# Patient Record
Sex: Male | Born: 1955 | Race: White | Hispanic: No | Marital: Married | Smoking: Never smoker
Health system: Southern US, Community
[De-identification: ages and names within clinical notes are randomized; demographics above are authoritative.]

## PROBLEM LIST (undated history)

## (undated) DIAGNOSIS — C8590 Non-Hodgkin lymphoma, unspecified, unspecified site: Secondary | ICD-10-CM

## (undated) DIAGNOSIS — K279 Peptic ulcer, site unspecified, unspecified as acute or chronic, without hemorrhage or perforation: Secondary | ICD-10-CM

## (undated) DIAGNOSIS — E785 Hyperlipidemia, unspecified: Secondary | ICD-10-CM

## (undated) DIAGNOSIS — K219 Gastro-esophageal reflux disease without esophagitis: Secondary | ICD-10-CM

## (undated) DIAGNOSIS — E119 Type 2 diabetes mellitus without complications: Secondary | ICD-10-CM

## (undated) DIAGNOSIS — K449 Diaphragmatic hernia without obstruction or gangrene: Secondary | ICD-10-CM

## (undated) DIAGNOSIS — I10 Essential (primary) hypertension: Secondary | ICD-10-CM

## (undated) HISTORY — DX: Essential (primary) hypertension: I10

## (undated) HISTORY — PX: COLONOSCOPY: SHX174

## (undated) HISTORY — DX: Hyperlipidemia, unspecified: E78.5

## (undated) HISTORY — PX: PILONIDAL CYST / SINUS EXCISION: SUR543

## (undated) HISTORY — DX: Gastro-esophageal reflux disease without esophagitis: K21.9

## (undated) HISTORY — PX: TONSILLECTOMY: SUR1361

## (undated) HISTORY — DX: Peptic ulcer, site unspecified, unspecified as acute or chronic, without hemorrhage or perforation: K27.9

## (undated) HISTORY — DX: Non-Hodgkin lymphoma, unspecified, unspecified site: C85.90

---

## 1978-02-07 HISTORY — PX: FINGER AMPUTATION: SHX636

## 2003-02-08 LAB — FECAL OCCULT BLOOD, GUAIAC: Fecal Occult Blood: NEGATIVE

## 2004-04-19 ENCOUNTER — Ambulatory Visit: Payer: Self-pay | Admitting: Family Medicine

## 2005-12-16 ENCOUNTER — Ambulatory Visit: Payer: Self-pay | Admitting: Family Medicine

## 2006-07-14 ENCOUNTER — Ambulatory Visit: Payer: Self-pay | Admitting: Internal Medicine

## 2006-07-14 DIAGNOSIS — K219 Gastro-esophageal reflux disease without esophagitis: Secondary | ICD-10-CM

## 2006-07-14 DIAGNOSIS — E781 Pure hyperglyceridemia: Secondary | ICD-10-CM

## 2006-07-14 DIAGNOSIS — M109 Gout, unspecified: Secondary | ICD-10-CM

## 2006-07-14 DIAGNOSIS — K279 Peptic ulcer, site unspecified, unspecified as acute or chronic, without hemorrhage or perforation: Secondary | ICD-10-CM | POA: Insufficient documentation

## 2006-07-14 LAB — CONVERTED CEMR LAB
Albumin: 4.7 g/dL (ref 3.5–5.2)
BUN: 9 mg/dL (ref 6–23)
Bilirubin Urine: NEGATIVE
CO2: 22 meq/L (ref 19–32)
Calcium: 9.4 mg/dL (ref 8.4–10.5)
Chloride: 102 meq/L (ref 96–112)
Cholesterol: 194 mg/dL (ref 0–200)
Creatinine, Ser: 1.13 mg/dL (ref 0.40–1.50)
Creatinine, Urine: 323.3 mg/dL
Glucose, Bld: 88 mg/dL (ref 70–99)
HDL: 50 mg/dL (ref >39)
Hemoglobin, Urine: NEGATIVE
Hemoglobin: 16.5 g/dL (ref 13.0–17.0)
Lymphocytes Relative: 38 % (ref 12–46)
Lymphs Abs: 3.4 10*3/uL — ABNORMAL HIGH (ref 0.7–3.3)
MCHC: 32.9 g/dL (ref 30.0–36.0)
Microalb Creat Ratio: 10.4 mg/g (ref 0.0–30.0)
Monocytes Absolute: 0.8 10*3/uL — ABNORMAL HIGH (ref 0.2–0.7)
Monocytes Relative: 9 % (ref 3–11)
Neutro Abs: 4.3 10*3/uL (ref 1.7–7.7)
Potassium: 4.2 meq/L (ref 3.5–5.3)
Protein, ur: NEGATIVE mg/dL
RBC: 4.9 M/uL (ref 4.22–5.81)
Total CHOL/HDL Ratio: 3.9
Urobilinogen, UA: 0.2 (ref 0.0–1.0)
WBC, UA: NONE SEEN cells/hpf (ref <3)
WBC: 9 10*3/uL (ref 4.0–10.5)

## 2006-07-17 ENCOUNTER — Telehealth (INDEPENDENT_AMBULATORY_CARE_PROVIDER_SITE_OTHER): Payer: Self-pay | Admitting: *Deleted

## 2006-07-19 ENCOUNTER — Ambulatory Visit: Payer: Self-pay | Admitting: Internal Medicine

## 2006-07-19 DIAGNOSIS — E785 Hyperlipidemia, unspecified: Secondary | ICD-10-CM

## 2006-07-19 DIAGNOSIS — I10 Essential (primary) hypertension: Secondary | ICD-10-CM | POA: Insufficient documentation

## 2006-08-16 ENCOUNTER — Ambulatory Visit: Payer: Self-pay | Admitting: Internal Medicine

## 2006-09-22 ENCOUNTER — Telehealth (INDEPENDENT_AMBULATORY_CARE_PROVIDER_SITE_OTHER): Payer: Self-pay | Admitting: *Deleted

## 2006-10-19 ENCOUNTER — Ambulatory Visit: Payer: Self-pay | Admitting: Internal Medicine

## 2006-10-20 DIAGNOSIS — E1149 Type 2 diabetes mellitus with other diabetic neurological complication: Secondary | ICD-10-CM | POA: Insufficient documentation

## 2006-10-20 LAB — CONVERTED CEMR LAB
ALT: 40 units/L (ref 0–53)
AST: 40 units/L — ABNORMAL HIGH (ref 0–37)
Albumin: 3.6 g/dL (ref 3.5–5.2)
Bilirubin, Direct: 0.1 mg/dL (ref 0.0–0.3)
Calcium: 9.2 mg/dL (ref 8.4–10.5)
Chloride: 101 meq/L (ref 96–112)
GFR calc Af Amer: 91 mL/min
GFR calc non Af Amer: 75 mL/min
PSA: 0.77 ng/mL (ref 0.10–4.00)
Sodium: 136 meq/L (ref 135–145)
Total CHOL/HDL Ratio: 3.8
Triglycerides: 234 mg/dL (ref 0–149)
VLDL: 47 mg/dL — ABNORMAL HIGH (ref 0–40)

## 2006-10-30 ENCOUNTER — Ambulatory Visit: Payer: Self-pay | Admitting: Family Medicine

## 2006-10-31 LAB — CONVERTED CEMR LAB: Hgb A1c MFr Bld: 6.3 % — ABNORMAL HIGH (ref 4.6–6.0)

## 2006-12-29 ENCOUNTER — Ambulatory Visit: Payer: Self-pay | Admitting: Family Medicine

## 2007-01-03 ENCOUNTER — Ambulatory Visit: Payer: Self-pay | Admitting: Family Medicine

## 2007-01-03 LAB — CONVERTED CEMR LAB
Cholesterol: 211 mg/dL (ref 0–200)
HDL: 47.7 mg/dL (ref >39.0)
VLDL: 100 mg/dL — ABNORMAL HIGH (ref 0–40)

## 2007-02-05 ENCOUNTER — Telehealth: Payer: Self-pay | Admitting: Family Medicine

## 2007-04-04 ENCOUNTER — Telehealth: Payer: Self-pay | Admitting: Family Medicine

## 2007-10-17 ENCOUNTER — Telehealth: Payer: Self-pay | Admitting: Family Medicine

## 2008-02-04 ENCOUNTER — Telehealth: Payer: Self-pay | Admitting: Family Medicine

## 2008-03-19 ENCOUNTER — Telehealth: Payer: Self-pay | Admitting: Family Medicine

## 2008-06-02 ENCOUNTER — Ambulatory Visit: Payer: Self-pay | Admitting: Family Medicine

## 2008-11-27 ENCOUNTER — Ambulatory Visit: Payer: Self-pay | Admitting: Family Medicine

## 2008-11-27 DIAGNOSIS — R55 Syncope and collapse: Secondary | ICD-10-CM

## 2008-12-05 ENCOUNTER — Ambulatory Visit: Payer: Self-pay | Admitting: Family Medicine

## 2008-12-08 LAB — CONVERTED CEMR LAB
Alkaline Phosphatase: 51 units/L (ref 39–117)
Bilirubin, Direct: 0 mg/dL (ref 0.0–0.3)
CO2: 29 meq/L (ref 19–32)
Calcium: 9.2 mg/dL (ref 8.4–10.5)
Creatinine, Ser: 1 mg/dL (ref 0.4–1.5)
Creatinine,U: 175.3 mg/dL
GFR calc non Af Amer: 83.12 mL/min (ref >60)
HDL: 41.7 mg/dL (ref >39.00)
Hgb A1c MFr Bld: 6.3 % (ref 4.6–6.5)
LDL Cholesterol: 101 mg/dL — ABNORMAL HIGH (ref 0–99)
Microalb Creat Ratio: 3.4 mg/g (ref 0.0–30.0)
PSA: 0.94 ng/mL (ref 0.10–4.00)
Sodium: 143 meq/L (ref 135–145)
Total Bilirubin: 0.6 mg/dL (ref 0.3–1.2)
Total CHOL/HDL Ratio: 4
Total Protein: 7.3 g/dL (ref 6.0–8.3)
Triglycerides: 118 mg/dL (ref 0.0–149.0)
VLDL: 23.6 mg/dL (ref 0.0–40.0)

## 2009-02-19 ENCOUNTER — Ambulatory Visit: Payer: Self-pay | Admitting: Family Medicine

## 2009-02-19 LAB — CONVERTED CEMR LAB: Hgb A1c MFr Bld: 6.2 % (ref 4.6–6.5)

## 2009-02-27 ENCOUNTER — Ambulatory Visit: Payer: Self-pay | Admitting: Family Medicine

## 2009-03-30 ENCOUNTER — Encounter: Payer: Self-pay | Admitting: Family Medicine

## 2009-03-30 ENCOUNTER — Ambulatory Visit: Payer: Self-pay | Admitting: Family Medicine

## 2009-04-22 ENCOUNTER — Telehealth: Payer: Self-pay | Admitting: Family Medicine

## 2009-05-26 ENCOUNTER — Ambulatory Visit: Payer: Self-pay | Admitting: Family Medicine

## 2009-05-27 LAB — CONVERTED CEMR LAB
Alkaline Phosphatase: 49 units/L (ref 39–117)
Bilirubin, Direct: 0.1 mg/dL (ref 0.0–0.3)
CO2: 28 meq/L (ref 19–32)
Calcium: 9.2 mg/dL (ref 8.4–10.5)
Creatinine, Ser: 1.2 mg/dL (ref 0.4–1.5)
GFR calc non Af Amer: 67.22 mL/min (ref >60)
Sodium: 138 meq/L (ref 135–145)
Total Protein: 7.2 g/dL (ref 6.0–8.3)

## 2009-06-02 ENCOUNTER — Ambulatory Visit: Payer: Self-pay | Admitting: Family Medicine

## 2009-11-19 ENCOUNTER — Ambulatory Visit: Payer: Self-pay | Admitting: Family Medicine

## 2009-11-20 LAB — CONVERTED CEMR LAB
ALT: 31 units/L (ref 0–53)
BUN: 12 mg/dL (ref 6–23)
CO2: 29 meq/L (ref 19–32)
Calcium: 9.3 mg/dL (ref 8.4–10.5)
Chloride: 99 meq/L (ref 96–112)
Cholesterol: 190 mg/dL (ref 0–200)
Creatinine, Ser: 1 mg/dL (ref 0.4–1.5)
Direct LDL: 104.9 mg/dL
HDL: 43.1 mg/dL (ref >39.00)
Total Bilirubin: 0.6 mg/dL (ref 0.3–1.2)
Total CHOL/HDL Ratio: 4
Total Protein: 6.6 g/dL (ref 6.0–8.3)
Triglycerides: 368 mg/dL — ABNORMAL HIGH (ref 0.0–149.0)
Uric Acid, Serum: 9 mg/dL — ABNORMAL HIGH (ref 4.0–7.8)

## 2009-11-27 ENCOUNTER — Ambulatory Visit: Payer: Self-pay | Admitting: Family Medicine

## 2009-11-27 DIAGNOSIS — E538 Deficiency of other specified B group vitamins: Secondary | ICD-10-CM | POA: Insufficient documentation

## 2009-11-27 LAB — HM DIABETES FOOT EXAM

## 2009-11-27 LAB — CONVERTED CEMR LAB: Cholesterol, target level: 200 mg/dL

## 2009-12-29 ENCOUNTER — Ambulatory Visit: Payer: Self-pay | Admitting: Family Medicine

## 2010-03-09 NOTE — Miscellaneous (Signed)
Summary: Consent to Foriegn Body removal-nail in thumb  Consent to Foriegn Body removal-nail in thumb   Imported By: Beau Fanny 03/30/2009 14:31:04  _____________________________________________________________________  External Attachment:    Type:   Image     Comment:   External Document
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
## 2010-03-09 NOTE — Progress Notes (Signed)
Summary: gout  Phone Note Call from Patient Call back at Home Phone [phone]   Caller: Patient Call For: Kerby Nora MD Summary of Call: Patient says that he has been dealing wiht gout for the past 2 days. He wants to know if he can get something called in to [facility] [address]. He also wants to know if he can get soemthing for the pain because he says he can barely walk.  Initial call taken by: Melody Comas,  April 22, 2009 9:21 AM  Follow-up for Phone Call        Can use ibuprofen 800 mg every 8 hours while colchicine working. Colchicine will help with pain. Take as directed.  Follow-up by: Kerby Nora MD,  April 22, 2009 10:00 AM  Additional Follow-up for Phone Call Additional follow up Details #1::        patient advised.Consuello Masse CMA  Additional Follow-up by: Benny Lennert CMA Duncan Dull),  April 22, 2009 11:46 AM    Prescriptions: COLCHICINE 0.6 MG  TABS (COLCHICINE) TAke 2 tablets x 1 then repeat 1 tablet every 2 hours until pain gone or diarrhea begins.  Do not exceed 10 tablets in 24 hours  #10 x 0   Entered and Authorized by:   Kerby Nora MD   Signed by:   Kerby Nora MD on 04/22/2009   Method used:   Electronically to        [facility] (retail)       [address]       [place], [place]  [address]       Ph: [phone]       Fax: [phone]   RxID:   [id number]

## 2010-04-13 ENCOUNTER — Encounter: Payer: Self-pay | Admitting: Family Medicine

## 2010-05-24 ENCOUNTER — Telehealth: Payer: Self-pay | Admitting: Family Medicine

## 2010-05-24 DIAGNOSIS — Z125 Encounter for screening for malignant neoplasm of prostate: Secondary | ICD-10-CM

## 2010-05-24 DIAGNOSIS — E785 Hyperlipidemia, unspecified: Secondary | ICD-10-CM

## 2010-05-24 DIAGNOSIS — E538 Deficiency of other specified B group vitamins: Secondary | ICD-10-CM

## 2010-05-24 DIAGNOSIS — E119 Type 2 diabetes mellitus without complications: Secondary | ICD-10-CM

## 2010-05-24 DIAGNOSIS — M109 Gout, unspecified: Secondary | ICD-10-CM

## 2010-05-24 NOTE — Telephone Encounter (Signed)
Message copied by Kerby Nora on Mon May 24, 2010  5:32 PM ------      Message from: [place], [place]      Created: Thu May 20, 2010  3:45 PM       Patient is scheduled for CPX labs, please order future labs, Thanks , Camelia Eng

## 2010-05-26 ENCOUNTER — Other Ambulatory Visit: Payer: Self-pay

## 2010-06-04 ENCOUNTER — Encounter: Payer: Self-pay | Admitting: Family Medicine

## 2010-06-23 ENCOUNTER — Telehealth: Payer: Self-pay | Admitting: *Deleted

## 2010-06-23 ENCOUNTER — Other Ambulatory Visit: Payer: Self-pay | Admitting: *Deleted

## 2010-06-23 MED ORDER — EPINEPHRINE 0.3 MG/0.3ML IJ DEVI: 0.3000 mg | Freq: Once | INTRAMUSCULAR | Status: AC

## 2010-06-23 NOTE — Telephone Encounter (Signed)
Wife is requesting a rx for a new epi pen to be called in to [facility]. 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

## 2010-06-23 NOTE — Telephone Encounter (Signed)
Sorry got patients mixed up with [name] [name]. Patient does need epi-pen refilled and sent to [facility]

## 2010-06-23 NOTE — Telephone Encounter (Signed)
Patients wife advised that epi pen already sent in and husband was advised

## 2010-06-23 NOTE — Telephone Encounter (Signed)
Epi pen not on med list   please add if this with refill request if it is correct pt and route back to me

## 2010-06-23 NOTE — Telephone Encounter (Signed)
Patient's wife called and says that he needs .3 auto injector. I added it to med list.

## 2010-08-06 ENCOUNTER — Other Ambulatory Visit: Payer: Self-pay | Admitting: *Deleted

## 2010-08-06 MED ORDER — LISINOPRIL 40 MG PO TABS: 40.0000 mg | ORAL_TABLET | Freq: Every day | ORAL | Status: DC

## 2010-08-06 MED ORDER — METOPROLOL TARTRATE 100 MG PO TABS: ORAL_TABLET | ORAL | Status: DC

## 2010-08-06 MED ORDER — SPIRONOLACTONE 50 MG PO TABS: 50.0000 mg | ORAL_TABLET | Freq: Every day | ORAL | Status: DC

## 2010-10-06 ENCOUNTER — Other Ambulatory Visit (INDEPENDENT_AMBULATORY_CARE_PROVIDER_SITE_OTHER): Payer: BC Managed Care – PPO

## 2010-10-06 DIAGNOSIS — E538 Deficiency of other specified B group vitamins: Secondary | ICD-10-CM

## 2010-10-06 DIAGNOSIS — M109 Gout, unspecified: Secondary | ICD-10-CM

## 2010-10-06 DIAGNOSIS — E119 Type 2 diabetes mellitus without complications: Secondary | ICD-10-CM

## 2010-10-06 DIAGNOSIS — Z125 Encounter for screening for malignant neoplasm of prostate: Secondary | ICD-10-CM

## 2010-10-06 DIAGNOSIS — E785 Hyperlipidemia, unspecified: Secondary | ICD-10-CM

## 2010-10-06 LAB — COMPREHENSIVE METABOLIC PANEL
ALT: 44 U/L (ref 0–53)
CO2: 28 mEq/L (ref 19–32)
Calcium: 9.1 mg/dL (ref 8.4–10.5)
Chloride: 103 mEq/L (ref 96–112)
Creatinine, Ser: 0.9 mg/dL (ref 0.4–1.5)
GFR: 88.65 mL/min (ref >60.00)
Sodium: 140 mEq/L (ref 135–145)
Total Protein: 6.4 g/dL (ref 6.0–8.3)

## 2010-10-06 LAB — LIPID PANEL
HDL: 45.6 mg/dL (ref >39.00)
Total CHOL/HDL Ratio: 4
VLDL: 35 mg/dL (ref 0.0–40.0)

## 2010-10-12 ENCOUNTER — Encounter: Payer: Self-pay | Admitting: Family Medicine

## 2010-10-12 ENCOUNTER — Ambulatory Visit (INDEPENDENT_AMBULATORY_CARE_PROVIDER_SITE_OTHER): Payer: BC Managed Care – PPO | Admitting: Family Medicine

## 2010-10-12 DIAGNOSIS — I498 Other specified cardiac arrhythmias: Secondary | ICD-10-CM

## 2010-10-12 DIAGNOSIS — E119 Type 2 diabetes mellitus without complications: Secondary | ICD-10-CM

## 2010-10-12 DIAGNOSIS — E1140 Type 2 diabetes mellitus with diabetic neuropathy, unspecified: Secondary | ICD-10-CM | POA: Insufficient documentation

## 2010-10-12 DIAGNOSIS — Z0000 Encounter for general adult medical examination without abnormal findings: Secondary | ICD-10-CM

## 2010-10-12 DIAGNOSIS — R200 Anesthesia of skin: Secondary | ICD-10-CM

## 2010-10-12 DIAGNOSIS — R001 Bradycardia, unspecified: Secondary | ICD-10-CM | POA: Insufficient documentation

## 2010-10-12 DIAGNOSIS — E785 Hyperlipidemia, unspecified: Secondary | ICD-10-CM

## 2010-10-12 DIAGNOSIS — R209 Unspecified disturbances of skin sensation: Secondary | ICD-10-CM

## 2010-10-12 DIAGNOSIS — R9431 Abnormal electrocardiogram [ECG] [EKG]: Secondary | ICD-10-CM | POA: Insufficient documentation

## 2010-10-12 DIAGNOSIS — Z1211 Encounter for screening for malignant neoplasm of colon: Secondary | ICD-10-CM

## 2010-10-12 DIAGNOSIS — I10 Essential (primary) hypertension: Secondary | ICD-10-CM

## 2010-10-12 DIAGNOSIS — R55 Syncope and collapse: Secondary | ICD-10-CM | POA: Insufficient documentation

## 2010-10-12 MED ORDER — GABAPENTIN 100 MG PO CAPS: 100.0000 mg | ORAL_CAPSULE | Freq: Every day | ORAL | Status: DC

## 2010-10-12 NOTE — Progress Notes (Signed)
Subjective:    Patient ID: [name] [name], male    DOB: 09-Feb-1955, 55 y.o.   MRN: [id number]  HPI  The patient is here for annual wellness exam and preventative care.    Since last OV.[facility] He has passed out 4 times. Most recent incident occurred late at night after jumping out of bed with reflux (only has if eats late, rare reflux on prilosec).  Fell. LOC and hit end table with forehead. Last 30 sec out.. Feels like normal after, no post tictal state. No chest pain, palpitations or other symptoms prior. No dizzines or lightheadedness.  Another incident getting up from recliner after coughing, felt choked. Was sick with URI at the time.  Witnessed. No neuro changes. Other two times also related to coughig, choking  Episodes... All occurred with same viral URI in the winter.   Hypertension: At goal on current medicaitons.  Using medication without problems or lightheadedness:  Chest pain with exertion: None Edema:None Short of breath:None Average home BPs:Not checking regularly Other issues:  Diabetes: Diet controlled. Lab Results  Component Value Date   HGBA1C 6.4 10/06/2010  Using medications without difficulties: Hypoglycemic episodes:NOne Hyperglycemic episodes:None Feet problems: toes numb, feet burn by end of the day. Ongoing for past year, worse in last 6 months. No new back problems. Blood Sugars averaging:None eye exam within last year:No Also notes when eating  He notes tingling in B hands with eating or if holding a tool or wrench. No pain in legs with wlaking that goes away with rest, but some hair loss in lower legs.  Elevated Cholesterol: LDL at goal <100, triglycerides better, almost at goal on fish oil. Other complaints:  Gout: no gout flares for a year on allopurinol. Uric acid still elevated some.   Review of Systems See HPI, ptherwise no pertinant positives    Objective:   Physical Exam  Constitutional: He appears well-developed and well-nourished.   Non-toxic appearance. He does not appear ill. No distress.  HENT:  Head: Normocephalic and atraumatic.  Right Ear: Hearing, tympanic membrane, external ear and ear canal normal.  Left Ear: Hearing, tympanic membrane, external ear and ear canal normal.  Nose: Nose normal.  Mouth/Throat: Uvula is midline, oropharynx is clear and moist and mucous membranes are normal.  Eyes: Conjunctivae, EOM and lids are normal. Pupils are equal, round, and reactive to light. No foreign bodies found.  Neck: Trachea normal, normal range of motion and phonation normal. Neck supple. Carotid bruit is not present. No mass and no thyromegaly present.  Cardiovascular: Normal rate, regular rhythm, S1 normal, S2 normal, intact distal pulses and normal pulses.  Exam reveals no gallop.   No murmur heard. Pulmonary/Chest: Breath sounds normal. He has no wheezes. He has no rhonchi. He has no rales.  Abdominal: Soft. Normal appearance and bowel sounds are normal. There is no hepatosplenomegaly. There is no tenderness. There is no rebound, no guarding and no CVA tenderness. No hernia. Hernia confirmed negative in the right inguinal area and confirmed negative in the left inguinal area.  Genitourinary: Prostate normal, testes normal and penis normal. Rectal exam shows no external hemorrhoid, no internal hemorrhoid, no fissure, no mass, no tenderness and anal tone normal. Guaiac negative stool. Prostate is not enlarged and not tender. Right testis shows no mass and no tenderness. Left testis shows no mass and no tenderness. No paraphimosis or penile tenderness.  Lymphadenopathy:    He has no cervical adenopathy.       Right: No inguinal  adenopathy present.       Left: No inguinal adenopathy present.  Neurological: He is alert. He has normal strength and normal reflexes. No cranial nerve deficit or sensory deficit. Gait normal.  Skin: Skin is warm, dry and intact. No rash noted.  Psychiatric: He has a normal mood and affect. His  speech is normal and behavior is normal. Judgment normal.     Diabetic foot exam: Normal inspection No skin breakdown No calluses  Normal DP pulses Normal sensation to light touch and monofilament Nails normal      Assessment & Plan:  Complete Physcial Exam: The patient's preventative maintenance and recommended screening tests for an annual wellness exam were reviewed in full today. Brought up to date unless services declined.  Counselled on the importance of diet, exercise, and its role in overall health and mortality. The patient's FH and SH was reviewed, including their home life, tobacco status, and drug and alcohol status.   PSa stable  Colon cancer screening: interested in colonoscopy.. Never went last year.

## 2010-10-12 NOTE — Assessment & Plan Note (Signed)
Sounds most like vasovagal syncope, but may be related to bradycardia. Will decrease metoprolol some. Refer to cardiology given abnormal EKG for any further evaluation.

## 2010-10-12 NOTE — Patient Instructions (Addendum)
Recommend diabetic eye exam yearly.   Decrease alcohol in diet.. No more than 2 at a time. Try to stop completely. Return for labs in 2 weeks. Stop at front to speak with [facility] about colonoscopy referral and cardiology referral. Decrease metoprolol to 1 tab po twice daily... Keep appt for referral with cardiology.  Trial of neurontin for  foot numbness, start at 100 mg at bedtime.

## 2010-10-26 ENCOUNTER — Encounter: Payer: Self-pay | Admitting: Cardiovascular Disease

## 2010-10-26 ENCOUNTER — Ambulatory Visit (INDEPENDENT_AMBULATORY_CARE_PROVIDER_SITE_OTHER): Payer: BC Managed Care – PPO | Admitting: Cardiovascular Disease

## 2010-10-26 ENCOUNTER — Other Ambulatory Visit (INDEPENDENT_AMBULATORY_CARE_PROVIDER_SITE_OTHER): Payer: BC Managed Care – PPO

## 2010-10-26 DIAGNOSIS — G479 Sleep disorder, unspecified: Secondary | ICD-10-CM

## 2010-10-26 DIAGNOSIS — R001 Bradycardia, unspecified: Secondary | ICD-10-CM

## 2010-10-26 DIAGNOSIS — I498 Other specified cardiac arrhythmias: Secondary | ICD-10-CM

## 2010-10-26 DIAGNOSIS — I10 Essential (primary) hypertension: Secondary | ICD-10-CM

## 2010-10-26 DIAGNOSIS — E785 Hyperlipidemia, unspecified: Secondary | ICD-10-CM

## 2010-10-26 DIAGNOSIS — R55 Syncope and collapse: Secondary | ICD-10-CM

## 2010-10-26 LAB — HEPATIC FUNCTION PANEL
AST: 38 U/L — ABNORMAL HIGH (ref 0–37)
Albumin: 4 g/dL (ref 3.5–5.2)
Alkaline Phosphatase: 50 U/L (ref 39–117)
Bilirubin, Direct: 0.1 mg/dL (ref 0.0–0.3)

## 2010-10-26 NOTE — Assessment & Plan Note (Signed)
Blood pressure is borderline elevated. We've asked him to monitor his blood pressure at home. 

## 2010-10-26 NOTE — Assessment & Plan Note (Signed)
Improved sinus bradycardia with decreased metoprolol dose. The 50 mg b.i.d. He appears to suit him better.

## 2010-10-26 NOTE — Progress Notes (Signed)
Patient ID: [name] [name], male    DOB: Jun 24, 1955, 55 y.o.   MRN: [id number]  HPI Comments: 55 year old male with a history of obesity, hypertension, GERD who presents on referral from Dr. Ermalene Searing for recent episodes of syncope, abnormal EKG.  He reports that since January, he has had 3 episodes of syncope. The first 2 episodes happened while he was coughing. The first episode in January, he was recovering from a upper respiratory infection. He was on the couch and had significant coughing fit. He passed out for 15-20 seconds and came too.  The second episode in February, he was sitting in a recliner and was coughing and choking. He again had a prolonged fit of coughing and choking and had syncope, hitting his head on the table.  Third episode happened 2 weeks ago. He ate a dinner late in the evening, woke up in the night coughing and choking, stood up from the bed coughing and choking and had syncope. He cut his nose on a bedside table.  He does report having difficulty with sleeping with significant snoring. He is uncertain if he has apnea. He reports his wife has not used his snoring. The snoring has been worse with his weight. He wakes up does not feel refreshed and believes that he snores all night. He does have vivid dreams.  At baseline, he is active, works [employer] and has no significant symptoms of lightheadedness or near syncope. He has had no significant change in the way he feels would be decreased dose of metoprolol.  EKG shows normal sinus rhythm with rate 80 beats per minute with left anterior Fascicular block  Outpatient Encounter Prescriptions as of 10/26/2010  Medication Sig Dispense Refill  . allopurinol (ZYLOPRIM) 100 MG tablet Take 100 mg by mouth daily.        . colchicine 0.6 MG tablet Take 0.6 mg by mouth as directed.        Marland Kitchen lisinopril (PRINIVIL,ZESTRIL) 40 MG tablet Take 1 tablet (40 mg total) by mouth daily.  90 tablet  0  . metoprolol (LOPRESSOR) 100 MG  tablet Take 50 mg by mouth 2 (two) times daily.        . Omega-3 Fatty Acids (FISH OIL) 1000 MG CAPS Take 1 capsule by mouth 2 (two) times daily.        Marland Kitchen omeprazole (PRILOSEC) 20 MG capsule Take 20 mg by mouth daily as needed.        Marland Kitchen spironolactone (ALDACTONE) 50 MG tablet Take 1 tablet (50 mg total) by mouth daily.  90 tablet  0  . gabapentin (NEURONTIN) 100 MG capsule Take 1 capsule (100 mg total) by mouth daily.  30 capsule  2  . DISCONTD: vitamin B-12 (CYANOCOBALAMIN) 1000 MCG tablet Take 1,000 mcg by mouth daily.            Review of Systems  Constitutional: Negative.   HENT: Negative.   Eyes: Negative.   Respiratory: Positive for cough and choking.   Cardiovascular: Negative.   Gastrointestinal: Negative.   Musculoskeletal: Negative.   Skin: Negative.   Neurological: Positive for syncope.  Hematological: Negative.   Psychiatric/Behavioral: Negative.   All other systems reviewed and are negative.    BP 140/70  Pulse 80  Ht 5\' 4"  (1.626 m)  Wt 214 lb (97.07 kg)  BMI 36.73 kg/m2   Physical Exam  Nursing note and vitals reviewed. Constitutional: He is oriented to person, place, and time. He appears well-developed and well-nourished.  HENT:  Head: Normocephalic.  Nose: Nose normal.  Mouth/Throat: Oropharynx is clear and moist.  Eyes: Conjunctivae are normal. Pupils are equal, round, and reactive to light.  Neck: Normal range of motion. Neck supple. No JVD present.  Cardiovascular: Normal rate, regular rhythm, S1 normal, S2 normal, normal heart sounds and intact distal pulses.  Exam reveals no gallop and no friction rub.   No murmur heard. Pulmonary/Chest: Effort normal and breath sounds normal. No respiratory distress. He has no wheezes. He has no rales. He exhibits no tenderness.  Abdominal: Soft. Bowel sounds are normal. He exhibits no distension. There is no tenderness.  Musculoskeletal: Normal range of motion. He exhibits edema. He exhibits no tenderness.    Lymphadenopathy:    He has no cervical adenopathy.  Neurological: He is alert and oriented to person, place, and time. Coordination normal.  Skin: Skin is warm and dry. No rash noted. No erythema.  Psychiatric: He has a normal mood and affect. His behavior is normal. Judgment and thought content normal.           Assessment and Plan

## 2010-10-26 NOTE — Patient Instructions (Signed)
You are doing well. No medication changes were made. Please call us if you have new issues that need to be addressed    

## 2010-10-26 NOTE — Assessment & Plan Note (Signed)
Etiology of the syncope is likely secondary to his coughing fits or choking fits and underlying Valsalva. The previously noted bradycardia on high-dose metoprolol could have contributed. He appears to have a improved heart rate on low-dose metoprolol and I suggested he stay on 50 mg b.i.d.. He may continue to have these cough syncope episodes in the future and we may have to aggressively treat his coughing if needed (ie possibly with Robitussin with codeine).   No further workup is needed at this time. Clinically, he has no significant murmurs. He reports his weight is slowly coming down. Cholesterol is much improved. He is active at work in [employer] with no complaints. No indication that he is having underlying arrhythmia as each episode is associated with coughing or choking.   I did suggest he try eating earlier, take omeprazole x2 for heartburn prn.

## 2010-10-26 NOTE — Assessment & Plan Note (Signed)
He does have the symptoms and body habitus for sleep apnea. He reports having significant snoring. He does not feel he sleeps well and wakes tired. He may benefit from an outpatient sleep study.

## 2010-10-26 NOTE — Assessment & Plan Note (Signed)
Cholesterol has significantly improved with weight loss and fish oil.

## 2010-10-27 LAB — HEPATITIS PANEL, ACUTE
HCV Ab: NEGATIVE
Hep A IgM: NEGATIVE

## 2010-10-29 NOTE — Assessment & Plan Note (Signed)
Most likely diabetic neuropathy... No suggestion of central source form back.  Trial of neurontin for  foot numbness, start at 100 mg at bedtime.

## 2010-10-29 NOTE — Assessment & Plan Note (Signed)
Annotation: Patient is on two BP meds and is doing fairly well with control  per his report.  Decrease BBlocker due to bradycardia.

## 2010-10-29 NOTE — Assessment & Plan Note (Signed)
Good control

## 2010-10-29 NOTE — Assessment & Plan Note (Signed)
Like;ly at least partially due to [employer], decrease BBlocker.

## 2010-11-01 ENCOUNTER — Telehealth: Payer: Self-pay | Admitting: *Deleted

## 2010-11-01 MED ORDER — GUAIFENESIN-CODEINE 100-10 MG/5ML PO SYRP: 5.0000 mL | ORAL_SOLUTION | ORAL | Status: DC | PRN

## 2010-11-01 NOTE — Telephone Encounter (Signed)
Pt called stating cough worsening, during day and night with congestion. Per last note, robitussin with codeine was suggested that pt may need by pcp. Pt called Dr. Ermalene Searing office today and no one could see pt or call in Rx. Per Dr. Mariah Milling notified pt that 1-needs to double PPI as instructed 2- will set up for echo and sleep study to r/o any underlying causes of symptoms (syncope r/t cough/valsava?) and 3- we can give him Rx for Robitussin temporarily, but pt needs to f/u with PCP regarding cough, may need ABX, may have bronchitis, etc. Pt states he has f/u with Dr. Ermalene Searing next week. Advised pt that if cough worsens in meantime, he will need to be seen or go to urgent care. Pt verbalized understanding. Will order procedures above and call pt to schedule tomorrow.

## 2010-11-01 NOTE — Telephone Encounter (Signed)
Pt states Dr. Ermalene Searing had told him at his last visit that she would give him something for severe coughing spells.  I advised him that she is out of the office until Thursday, he says he needs something before then.  He then said that his cardiologist had told him the same thing, I suggested he call his cardiologist.

## 2010-11-02 ENCOUNTER — Telehealth: Payer: Self-pay | Admitting: *Deleted

## 2010-11-02 DIAGNOSIS — R55 Syncope and collapse: Secondary | ICD-10-CM

## 2010-11-02 DIAGNOSIS — G4733 Obstructive sleep apnea (adult) (pediatric): Secondary | ICD-10-CM

## 2010-11-02 NOTE — Telephone Encounter (Signed)
Please call pt to schedule echo at next available, per Dr. Mariah Milling. Thanks.

## 2010-11-10 ENCOUNTER — Encounter: Payer: Self-pay | Admitting: *Deleted

## 2010-11-11 ENCOUNTER — Ambulatory Visit (INDEPENDENT_AMBULATORY_CARE_PROVIDER_SITE_OTHER): Payer: BC Managed Care – PPO | Admitting: Family Medicine

## 2010-11-11 ENCOUNTER — Encounter: Payer: Self-pay | Admitting: Family Medicine

## 2010-11-11 DIAGNOSIS — R55 Syncope and collapse: Secondary | ICD-10-CM

## 2010-11-11 DIAGNOSIS — R200 Anesthesia of skin: Secondary | ICD-10-CM

## 2010-11-11 DIAGNOSIS — I10 Essential (primary) hypertension: Secondary | ICD-10-CM

## 2010-11-11 DIAGNOSIS — R209 Unspecified disturbances of skin sensation: Secondary | ICD-10-CM

## 2010-11-11 DIAGNOSIS — I498 Other specified cardiac arrhythmias: Secondary | ICD-10-CM

## 2010-11-11 DIAGNOSIS — R001 Bradycardia, unspecified: Secondary | ICD-10-CM

## 2010-11-11 DIAGNOSIS — E538 Deficiency of other specified B group vitamins: Secondary | ICD-10-CM

## 2010-11-11 DIAGNOSIS — E119 Type 2 diabetes mellitus without complications: Secondary | ICD-10-CM

## 2010-11-11 DIAGNOSIS — R7989 Other specified abnormal findings of blood chemistry: Secondary | ICD-10-CM

## 2010-11-11 MED ORDER — BENZONATATE 100 MG PO CAPS: 100.0000 mg | ORAL_CAPSULE | Freq: Three times a day (TID) | ORAL | Status: AC | PRN

## 2010-11-11 MED ORDER — PSEUDOEPHEDRINE-CODEINE-GG 30-10-100 MG/5ML PO SOLN: 10.0000 mL | Freq: Four times a day (QID) | ORAL | Status: DC | PRN

## 2010-11-11 MED ORDER — GABAPENTIN 100 MG PO CAPS: ORAL_CAPSULE | ORAL | Status: DC

## 2010-11-11 NOTE — Assessment & Plan Note (Signed)
Resolved

## 2010-11-11 NOTE — Assessment & Plan Note (Signed)
Insurance does not cover codeine guafenesin.. Sent in tessalon perles for him to have on hand if needed to avoid aggressive cough when sick,

## 2010-11-11 NOTE — Assessment & Plan Note (Signed)
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 Nml B12. Improved with gabapentin. Titrate up until goal control reached or SE.

## 2010-11-11 NOTE — Assessment & Plan Note (Signed)
Likely due to ETOH use.. Decrease ETOH use/stop entirely. Hepatitis panel neg. Improved at last check... Continue to follow. Consider liver US if elevating again at next check.

## 2010-11-11 NOTE — Assessment & Plan Note (Signed)
Resolved on lower dose of BBlocker.

## 2010-11-11 NOTE — Patient Instructions (Addendum)
Hold onto cough suppressant .. May use if cough starts to avoid cough related syncope.  Decrease to 2 beers a day or stop completely. Increase gabapentin to 200 mg at bedtime x 1 week, then 300 mg  X 1 week, can take a morning or  mid day dose of 100 mg as well if symptoms bothering you during daytime. Increase 100 mg at at time.  Work on regular exercsie, healthy eating and weight loss.

## 2010-11-11 NOTE — Progress Notes (Signed)
  Subjective:    Patient ID: [name] [name], male    DOB: 06-04-1955, 55 y.o.   MRN: [id number]  HPI  55 year oldmale with history of DM , HTN and high cholesterol returns for follow up following several episodes of syncope.   Evaluated by Cardiology... Felt most likely cough triggered vasovagal syncope and possibly med induced bradycardia contributing. Pulse now better at 63 on lower dose of BBlocker. BP remains fairly well controlled (slightly above goal today 130/80) Per pt at home BPs 110-120/70-85  No further work up indicated. No further episodes of syncope.  He had also been noticing numbness and burning in his feet. Most happening later in day.   At last OV we began a trial of neurontin low dose.  He has noted 25 percent improvement in symptoms. Less burning. No back pain, no weakness in legs. NO associated SE to medication.   He does drink 2-5 beers a day.  B12 nml at last check.      Review of Systems  Constitutional: Negative for fever and fatigue.  HENT: Negative for ear pain.   Eyes: Negative for pain.  Respiratory: Negative for cough, shortness of breath and wheezing.   Cardiovascular: Negative for chest pain, palpitations and leg swelling.  Gastrointestinal: Negative for abdominal distention.       Objective:   Physical Exam  Constitutional: Vital signs are normal. He appears well-developed and well-nourished.  HENT:  Head: Normocephalic.  Right Ear: Hearing normal.  Left Ear: Hearing normal.  Nose: Nose normal.  Mouth/Throat: Oropharynx is clear and moist and mucous membranes are normal.  Neck: Trachea normal. Carotid bruit is not present. No mass and no thyromegaly present.  Cardiovascular: Normal rate, regular rhythm and normal pulses.  Exam reveals no gallop, no distant heart sounds and no friction rub.   No murmur heard.      No peripheral edema  Pulmonary/Chest: Effort normal and breath sounds normal. No respiratory distress.  Skin: Skin is  warm, dry and intact. No rash noted.  Psychiatric: He has a normal mood and affect. His speech is normal and behavior is normal. Thought content normal.          Assessment & Plan:

## 2010-11-11 NOTE — Assessment & Plan Note (Signed)
Borderline control.. Continue to follow at home.

## 2010-11-16 ENCOUNTER — Other Ambulatory Visit: Payer: BC Managed Care – PPO | Admitting: *Deleted

## 2010-11-30 ENCOUNTER — Telehealth: Payer: Self-pay | Admitting: *Deleted

## 2010-11-30 NOTE — Telephone Encounter (Signed)
Multiple attempts to reach pt to schedule sleep study. Sleep center with [facility] has also called pt to try to schedule and have been unable to leave msg. At pt's ov 10/2010 it was recc he have split night study, unable to get scheduled at this time.

## 2010-12-02 ENCOUNTER — Encounter: Payer: Self-pay | Admitting: *Deleted

## 2010-12-03 ENCOUNTER — Other Ambulatory Visit: Payer: BC Managed Care – PPO | Admitting: Internal Medicine

## 2010-12-09 ENCOUNTER — Other Ambulatory Visit: Payer: Self-pay | Admitting: *Deleted

## 2010-12-09 MED ORDER — SPIRONOLACTONE 50 MG PO TABS: 50.0000 mg | ORAL_TABLET | Freq: Every day | ORAL | Status: DC

## 2010-12-09 MED ORDER — LISINOPRIL 40 MG PO TABS: 40.0000 mg | ORAL_TABLET | Freq: Every day | ORAL | Status: DC

## 2010-12-09 MED ORDER — ALLOPURINOL 100 MG PO TABS: 100.0000 mg | ORAL_TABLET | Freq: Every day | ORAL | Status: DC

## 2011-05-12 ENCOUNTER — Other Ambulatory Visit: Payer: BC Managed Care – PPO

## 2011-05-19 ENCOUNTER — Ambulatory Visit: Payer: BC Managed Care – PPO | Admitting: Family Medicine

## 2011-06-10 ENCOUNTER — Other Ambulatory Visit: Payer: BC Managed Care – PPO

## 2011-06-15 ENCOUNTER — Other Ambulatory Visit: Payer: BC Managed Care – PPO

## 2011-06-16 ENCOUNTER — Ambulatory Visit: Payer: BC Managed Care – PPO | Admitting: Family Medicine

## 2011-06-21 ENCOUNTER — Ambulatory Visit: Payer: BC Managed Care – PPO | Admitting: Family Medicine

## 2011-07-11 ENCOUNTER — Other Ambulatory Visit (INDEPENDENT_AMBULATORY_CARE_PROVIDER_SITE_OTHER): Payer: BC Managed Care – PPO

## 2011-07-11 DIAGNOSIS — I10 Essential (primary) hypertension: Secondary | ICD-10-CM

## 2011-07-11 DIAGNOSIS — E119 Type 2 diabetes mellitus without complications: Secondary | ICD-10-CM

## 2011-07-11 LAB — COMPREHENSIVE METABOLIC PANEL
Albumin: 4 g/dL (ref 3.5–5.2)
Alkaline Phosphatase: 58 U/L (ref 39–117)
BUN: 14 mg/dL (ref 6–23)
Calcium: 9.7 mg/dL (ref 8.4–10.5)
Chloride: 101 mEq/L (ref 96–112)
Glucose, Bld: 114 mg/dL — ABNORMAL HIGH (ref 70–99)
Potassium: 4.7 mEq/L (ref 3.5–5.1)

## 2011-07-11 LAB — LIPID PANEL
Cholesterol: 147 mg/dL (ref 0–200)
Triglycerides: 106 mg/dL (ref 0.0–149.0)

## 2011-07-12 ENCOUNTER — Ambulatory Visit: Payer: BC Managed Care – PPO | Admitting: Family Medicine

## 2011-07-14 ENCOUNTER — Encounter: Payer: Self-pay | Admitting: Family Medicine

## 2011-07-14 ENCOUNTER — Ambulatory Visit (INDEPENDENT_AMBULATORY_CARE_PROVIDER_SITE_OTHER): Payer: BC Managed Care – PPO | Admitting: Family Medicine

## 2011-07-14 VITALS — BP 120/72 | HR 55 | Temp 98.4°F | Ht 64.0 in | Wt 203.0 lb

## 2011-07-14 DIAGNOSIS — R7989 Other specified abnormal findings of blood chemistry: Secondary | ICD-10-CM

## 2011-07-14 DIAGNOSIS — E119 Type 2 diabetes mellitus without complications: Secondary | ICD-10-CM

## 2011-07-14 DIAGNOSIS — R200 Anesthesia of skin: Secondary | ICD-10-CM

## 2011-07-14 DIAGNOSIS — E785 Hyperlipidemia, unspecified: Secondary | ICD-10-CM

## 2011-07-14 DIAGNOSIS — I10 Essential (primary) hypertension: Secondary | ICD-10-CM

## 2011-07-14 DIAGNOSIS — R209 Unspecified disturbances of skin sensation: Secondary | ICD-10-CM

## 2011-07-14 NOTE — Assessment & Plan Note (Signed)
Improved.  Off gabapentin.   

## 2011-07-14 NOTE — Assessment & Plan Note (Signed)
Well controlled on no med. 

## 2011-07-14 NOTE — Progress Notes (Signed)
  Subjective:    Patient ID: [name] [name], male    DOB: August 30, 1955, 56 y.o.   MRN: [id number]  HPI Follow up  Diabetes:  Well controlled. Lab Results  Component Value Date   HGBA1C 6.2 07/11/2011  Using medications without difficulties: Hypoglycemic episodes:? Hyperglycemic episodes:? Feet problems: Neuropathy has improved. Blood Sugars averaging:Not checking eye exam within last year:None... Due.  Hypertension:  Well controlled.  Using medication without problems or lightheadedness: None Chest pain with exertion: None Edema:None Short of breath:None Average home BPs: 120/70 Other issues: Has lost 8 lbs since last OV.  Elevated Cholesterol: LDL at goal on fish oil Lab Results  Component Value Date   CHOL 147 07/11/2011   HDL 52.80 07/11/2011   LDLCALC 73 07/11/2011   LDLDIRECT 104.9 11/19/2009   TRIG 106.0 07/11/2011   CHOLHDL 3 07/11/2011   Using medications without problems: Not on. Muscle aches: None Diet compliance: Great Exercise:Moderate Other complaints:      Review of Systems  Constitutional: Negative for fatigue.  HENT: Negative for neck pain.   Eyes: Negative for pain.  Respiratory: Negative for shortness of breath.   Cardiovascular: Negative for chest pain.  Gastrointestinal: Negative for abdominal pain.       Objective:   Physical Exam  Constitutional: Vital signs are normal. He appears well-developed and well-nourished.       overweight  HENT:  Head: Normocephalic.  Right Ear: Hearing normal.  Left Ear: Hearing normal.  Nose: Nose normal.  Mouth/Throat: Oropharynx is clear and moist and mucous membranes are normal.  Neck: Trachea normal. Carotid bruit is not present. No mass and no thyromegaly present.  Cardiovascular: Normal rate, regular rhythm and normal pulses.  Exam reveals no gallop, no distant heart sounds and no friction rub.   No murmur heard.      No peripheral edema  Pulmonary/Chest: Effort normal and breath sounds normal. No  respiratory distress.  Skin: Skin is warm, dry and intact. No rash noted.  Psychiatric: He has a normal mood and affect. His speech is normal and behavior is normal. Thought content normal.    Diabetic foot exam: Normal inspection No skin breakdown No calluses  Normal DP pulses Normal sensation to light touch and monofilament Nails normal       Assessment & Plan:

## 2011-07-14 NOTE — Assessment & Plan Note (Signed)
Likely due to fatty liver. Neg hep panel. On no statin. Follow. Continue weight loss.

## 2011-07-14 NOTE — Patient Instructions (Signed)
Make sure to have yearly eye exam. Consider B12 supplement every now and then. Follow up in 6 months for CPX with fasting labs prior.

## 2011-07-14 NOTE — Assessment & Plan Note (Signed)
Well controlled. Continue current medication. Encouraged exercise, weight loss, healthy eating habits.  

## 2011-08-03 ENCOUNTER — Other Ambulatory Visit: Payer: Self-pay | Admitting: *Deleted

## 2011-08-03 NOTE — Telephone Encounter (Signed)
No recent chang in med as I am aware from notes.. Please call to verify dose with pt.. Refill as appropriate.

## 2011-08-03 NOTE — Telephone Encounter (Signed)
Faxed form states take 2 tablets by mouth 2 times daily med list states take 0.5 by mouth 2 times daily, please advise

## 2011-08-04 MED ORDER — METOPROLOL TARTRATE 100 MG PO TABS: 50.0000 mg | ORAL_TABLET | Freq: Two times a day (BID) | ORAL | Status: DC

## 2011-08-04 NOTE — Telephone Encounter (Signed)
Patient take  0.5 tablet bid

## 2011-10-12 ENCOUNTER — Other Ambulatory Visit: Payer: Self-pay | Admitting: Family Medicine

## 2012-01-02 ENCOUNTER — Telehealth: Payer: Self-pay | Admitting: Family Medicine

## 2012-01-02 DIAGNOSIS — E785 Hyperlipidemia, unspecified: Secondary | ICD-10-CM

## 2012-01-02 DIAGNOSIS — M109 Gout, unspecified: Secondary | ICD-10-CM

## 2012-01-02 DIAGNOSIS — E1149 Type 2 diabetes mellitus with other diabetic neurological complication: Secondary | ICD-10-CM

## 2012-01-02 DIAGNOSIS — Z125 Encounter for screening for malignant neoplasm of prostate: Secondary | ICD-10-CM

## 2012-01-02 DIAGNOSIS — E538 Deficiency of other specified B group vitamins: Secondary | ICD-10-CM

## 2012-01-02 NOTE — Telephone Encounter (Signed)
Message copied by Excell Seltzer on Mon Jan 02, 2012 12:22 PM ------      Message from: Baldomero Lamy      Created: Thu Dec 29, 2011 10:25 AM      Regarding: Cpx labs 12/2 Mon       Please order  future cpx labs for pt's upcomming lab appt.      Thanks      Rodney Booze

## 2012-01-09 ENCOUNTER — Other Ambulatory Visit (INDEPENDENT_AMBULATORY_CARE_PROVIDER_SITE_OTHER): Payer: 59

## 2012-01-09 DIAGNOSIS — Z125 Encounter for screening for malignant neoplasm of prostate: Secondary | ICD-10-CM

## 2012-01-09 DIAGNOSIS — M109 Gout, unspecified: Secondary | ICD-10-CM

## 2012-01-09 DIAGNOSIS — E538 Deficiency of other specified B group vitamins: Secondary | ICD-10-CM

## 2012-01-09 DIAGNOSIS — E785 Hyperlipidemia, unspecified: Secondary | ICD-10-CM

## 2012-01-09 DIAGNOSIS — E1149 Type 2 diabetes mellitus with other diabetic neurological complication: Secondary | ICD-10-CM

## 2012-01-09 LAB — COMPREHENSIVE METABOLIC PANEL
CO2: 29 mEq/L (ref 19–32)
Calcium: 9.7 mg/dL (ref 8.4–10.5)
Creatinine, Ser: 0.9 mg/dL (ref 0.4–1.5)
GFR: 89.34 mL/min (ref >60.00)
Glucose, Bld: 164 mg/dL — ABNORMAL HIGH (ref 70–99)
Total Bilirubin: 0.8 mg/dL (ref 0.3–1.2)

## 2012-01-09 LAB — HEMOGLOBIN A1C: Hgb A1c MFr Bld: 7.2 % — ABNORMAL HIGH (ref 4.6–6.5)

## 2012-01-09 LAB — URIC ACID: Uric Acid, Serum: 8.1 mg/dL — ABNORMAL HIGH (ref 4.0–7.8)

## 2012-01-09 LAB — LIPID PANEL
HDL: 56.7 mg/dL (ref >39.00)
Triglycerides: 167 mg/dL — ABNORMAL HIGH (ref 0.0–149.0)

## 2012-01-16 ENCOUNTER — Telehealth: Payer: Self-pay | Admitting: Family Medicine

## 2012-01-16 ENCOUNTER — Encounter: Payer: BC Managed Care – PPO | Admitting: Family Medicine

## 2012-01-16 NOTE — Telephone Encounter (Signed)
Patients was scheduled with wrong dr is scheduled with you later in the month.

## 2012-01-16 NOTE — Telephone Encounter (Signed)
I am not in office today.. Is he changing to a different provider?

## 2012-01-17 NOTE — Telephone Encounter (Signed)
Noted. Will discuss labs at that time with pt.

## 2012-01-27 ENCOUNTER — Ambulatory Visit (INDEPENDENT_AMBULATORY_CARE_PROVIDER_SITE_OTHER): Payer: 59 | Admitting: Family Medicine

## 2012-01-27 ENCOUNTER — Encounter: Payer: Self-pay | Admitting: Internal Medicine

## 2012-01-27 ENCOUNTER — Encounter: Payer: Self-pay | Admitting: Family Medicine

## 2012-01-27 VITALS — BP 120/72 | HR 86 | Temp 99.0°F | Ht 64.0 in | Wt 214.2 lb

## 2012-01-27 DIAGNOSIS — E785 Hyperlipidemia, unspecified: Secondary | ICD-10-CM

## 2012-01-27 DIAGNOSIS — Z0000 Encounter for general adult medical examination without abnormal findings: Secondary | ICD-10-CM

## 2012-01-27 DIAGNOSIS — I10 Essential (primary) hypertension: Secondary | ICD-10-CM

## 2012-01-27 DIAGNOSIS — R7989 Other specified abnormal findings of blood chemistry: Secondary | ICD-10-CM

## 2012-01-27 DIAGNOSIS — M109 Gout, unspecified: Secondary | ICD-10-CM

## 2012-01-27 DIAGNOSIS — Z1212 Encounter for screening for malignant neoplasm of rectum: Secondary | ICD-10-CM

## 2012-01-27 DIAGNOSIS — E1149 Type 2 diabetes mellitus with other diabetic neurological complication: Secondary | ICD-10-CM

## 2012-01-27 NOTE — Progress Notes (Signed)
  Subjective:    Patient ID: [name] [name], male    DOB: 11-20-1955, 56 y.o.   MRN: [id number]  HPI The patient is here for annual wellness exam and preventative care.     Diabetes: Worsened control compared to last check. Lab Results  Component Value Date   HGBA1C 7.2* 01/09/2012   Using medications without difficulties:  Hypoglycemic episodes:?  Hyperglycemic episodes:?  Feet problems: Neuropathy has improved.  Blood Sugars averaging:Not checking  eye exam within last year:None... Due.   Hypertension: Well controlled at goal <130/80 Using medication without problems or lightheadedness: None  Chest pain with exertion: None  Edema:None  Short of breath:None  Average home BPs: 120/70  Other issues: Has lost 8 lbs since last OV.   Elevated Cholesterol: LDL at goal on fish oil  Lab Results  Component Value Date   CHOL 188 01/09/2012   HDL 56.70 01/09/2012   LDLCALC 98 01/09/2012   LDLDIRECT 104.9 11/19/2009   TRIG 167.0* 01/09/2012   CHOLHDL 3 01/09/2012    Using medications without problems: Not on.  Muscle aches: None  Diet compliance: Poor this time of year. Plans diet change after 1st of the year. Has gained 5 lbs in last 3 months. Exercise:None Other complaints:   Gout: no gout flares in last year. Uric acid 8.1 likely die to poor diet.  Not on gout prophylaxis  Diabetic foot exam: Normal inspection No skin breakdown No calluses  Normal DP pulses Normal sensation to light touch and monofilament Nails normal   Review of Systems     Objective:   Physical Exam        Assessment & Plan:  The patient's preventative maintenance and recommended screening tests for an annual wellness exam were reviewed in full today. Brought up to date unless services declined.  Counselled on the importance of diet, exercise, and its role in overall health and mortality. The patient's FH and SH was reviewed, including their home life, tobacco status, and drug and alcohol  status.   Vaccines: upt odate with Td, flu refused. Nonsmoker Colon: never had colonoscopy... He wants to set this up.  Prostate:  Lab Results  Component Value Date   PSA 0.71 01/09/2012   PSA 0.78 10/06/2010   PSA 0.94 12/05/2008

## 2012-01-27 NOTE — Assessment & Plan Note (Signed)
Worsened control but remains at goal.

## 2012-01-27 NOTE — Assessment & Plan Note (Signed)
Work on low uric acid diet. 

## 2012-01-27 NOTE — Patient Instructions (Addendum)
Wor on healthy eating, low carb diet, weoght loss and exercise 3-5 times week. Stop at front desk to set up colonoscopy. Follow up in 3 months DM  Labs prior fasting check given worsened control.

## 2012-01-27 NOTE — Assessment & Plan Note (Signed)
Well controlled. Continue current medication.  

## 2012-01-27 NOTE — Assessment & Plan Note (Signed)
Worsened control given poor lifestyle. He will work on getting back on track.

## 2012-01-27 NOTE — Assessment & Plan Note (Signed)
Stable. Likely due to fatty liver. Hepatitis panel neg. Consider [place] if increasing.

## 2012-02-15 ENCOUNTER — Other Ambulatory Visit: Payer: Self-pay | Admitting: *Deleted

## 2012-02-15 MED ORDER — LISINOPRIL 40 MG PO TABS: 40.0000 mg | ORAL_TABLET | Freq: Every day | ORAL | Status: DC

## 2012-03-12 ENCOUNTER — Encounter: Payer: Self-pay | Admitting: Internal Medicine

## 2012-03-12 ENCOUNTER — Ambulatory Visit (AMBULATORY_SURGERY_CENTER): Payer: 59 | Admitting: *Deleted

## 2012-03-12 VITALS — Ht 64.0 in | Wt 212.8 lb

## 2012-03-12 DIAGNOSIS — Z1211 Encounter for screening for malignant neoplasm of colon: Secondary | ICD-10-CM

## 2012-03-12 MED ORDER — MOVIPREP 100 G PO SOLR: ORAL | Status: DC

## 2012-03-26 ENCOUNTER — Encounter: Payer: Self-pay | Admitting: Internal Medicine

## 2012-03-26 ENCOUNTER — Ambulatory Visit (AMBULATORY_SURGERY_CENTER): Payer: 59 | Admitting: Internal Medicine

## 2012-03-26 VITALS — BP 131/83 | HR 66 | Temp 98.1°F | Resp 17 | Ht 64.0 in | Wt 212.0 lb

## 2012-03-26 DIAGNOSIS — D126 Benign neoplasm of colon, unspecified: Secondary | ICD-10-CM

## 2012-03-26 DIAGNOSIS — Z1211 Encounter for screening for malignant neoplasm of colon: Secondary | ICD-10-CM

## 2012-03-26 DIAGNOSIS — K635 Polyp of colon: Secondary | ICD-10-CM

## 2012-03-26 MED ORDER — SODIUM CHLORIDE 0.9 % IV SOLN: 500.0000 mL | INTRAVENOUS | Status: DC

## 2012-03-26 NOTE — Progress Notes (Signed)
Called to room to assist during endoscopic procedure.  Patient ID and intended procedure confirmed with present staff. Received instructions for my participation in the procedure from the performing physician.  

## 2012-03-26 NOTE — Patient Instructions (Addendum)

## 2012-03-26 NOTE — Op Note (Signed)
[facility] [address] [place], [address]   COLONOSCOPY PROCEDURE REPORT  PATIENT: [name] [name], [name] [name]  MR#: [id number] BIRTHDATE: 21-Sep-1955 , 56  yrs. old GENDER: Male ENDOSCOPIST: Beverley Fiedler, MD REFERRED LK:[id number], Amy E. PROCEDURE DATE:  03/26/2012 PROCEDURE:   Colonoscopy with snare polypectomy and Colonoscopy with cold biopsy polypectomy ASA CLASS:   Class II INDICATIONS:average risk screening and first colonoscopy. MEDICATIONS: MAC sedation, administered by CRNA and propofol (Diprivan) 400mg  IV  DESCRIPTION OF PROCEDURE:   After the risks benefits and alternatives of the procedure were thoroughly explained, informed consent was obtained.  A digital rectal exam revealed no rectal mass.   The LB [id number]  endoscope was introduced through the anus and advanced to the cecum, which was identified by both the appendix and ileocecal valve. No adverse events experienced. The quality of the prep was good, using MoviPrep  The instrument was then slowly withdrawn as the colon was fully examined.   COLON FINDINGS: A sessile polyp measuring 5 mm in size was found in the ascending colon.  A polypectomy was performed with a cold snare.  The resection was complete and the polyp tissue was completely retrieved.   A pedunculated polyp measuring 8 mm in size was found in the sigmoid colon.  A polypectomy was performed using snare cautery.  The resection was complete and the polyp tissue was completely retrieved.   A sessile polyp measuring 5 mm in size was found in the rectosigmoid colon.  A polypectomy was performed with a cold snare.  The resection was complete and the polyp tissue was completely retrieved.   A sessile polyp measuring 4 mm in size was found in rectum seen upon the retroflexed view.  A polypectomy was performed with cold forceps.  The resection was complete and the polyp tissue was completely retrieved.   There was moderate diverticulosis  noted in the descending colon and sigmoid colon with associated muscular hypertrophy.   Moderate sized internal hemorrhoids were found.  Retroflexed views revealed internal hemorrhoids. The time to cecum=4 minutes 02 seconds.  Withdrawal time=20 minutes 00 seconds.  The scope was withdrawn and the procedure completed.  COMPLICATIONS: There were no complications.  ENDOSCOPIC IMPRESSION: 1.   Sessile polyp measuring 5 mm in size was found in the ascending colon; polypectomy was performed with a cold snare 2.   Pedunculated polyp measuring 8 mm in size was found in the sigmoid colon; polypectomy was performed using snare cautery 3.   Sessile polyp measuring 5 mm in size was found in the rectosigmoid colon; polypectomy was performed with a cold snare 4.   Sessile polyp measuring 4 mm in size was found in rectum seen upon the retroflexed view; polypectomy was performed with cold forceps 5.   There was moderate diverticulosis noted in the descending colon and sigmoid colon 6.   Moderate sized internal hemorrhoids  RECOMMENDATIONS: 1.  Hold aspirin, aspirin products, and anti-inflammatory medication for 1 week. 2.  Await pathology results 3.  High fiber diet 4.  If the polyps removed today are proven to be adenomatous (pre-cancerous) polyps, you will need a colonoscopy in 3 years. Otherwise you should continue to follow colorectal cancer screening guidelines for "routine risk" patients with a colonoscopy in 10 years.  You will receive a letter within 1-2 weeks with the results of your biopsy as well as final recommendations.  Please call my office if you have not received a letter after 3 weeks.   eSigned:  Beverley Fiedler, MD 03/26/2012 9:21 AM   cc: The Patient and Excell Seltzer, MD   PATIENT NAME:  [name] [name], [name] [name] MR#: [id number]

## 2012-03-26 NOTE — Progress Notes (Signed)
Patient did not experience any of the following events: a burn prior to discharge; a fall within the facility; wrong site/side/patient/procedure/implant event; or a hospital transfer or hospital admission upon discharge from the facility. (G8907) Patient did not have preoperative order for IV antibiotic SSI prophylaxis. (G8918)  

## 2012-03-27 ENCOUNTER — Telehealth: Payer: Self-pay | Admitting: *Deleted

## 2012-03-27 NOTE — Telephone Encounter (Signed)
No answer, message left for the patient. 

## 2012-04-01 ENCOUNTER — Encounter: Payer: Self-pay | Admitting: Internal Medicine

## 2012-04-20 ENCOUNTER — Other Ambulatory Visit (INDEPENDENT_AMBULATORY_CARE_PROVIDER_SITE_OTHER): Payer: 59

## 2012-04-20 DIAGNOSIS — E1149 Type 2 diabetes mellitus with other diabetic neurological complication: Secondary | ICD-10-CM

## 2012-04-20 LAB — HEMOGLOBIN A1C: Hgb A1c MFr Bld: 6.7 % — ABNORMAL HIGH (ref 4.6–6.5)

## 2012-04-27 ENCOUNTER — Ambulatory Visit (INDEPENDENT_AMBULATORY_CARE_PROVIDER_SITE_OTHER): Payer: 59 | Admitting: Family Medicine

## 2012-04-27 ENCOUNTER — Encounter: Payer: Self-pay | Admitting: Family Medicine

## 2012-04-27 VITALS — BP 120/78 | HR 69 | Temp 98.8°F | Ht 64.0 in | Wt 210.0 lb

## 2012-04-27 DIAGNOSIS — I10 Essential (primary) hypertension: Secondary | ICD-10-CM

## 2012-04-27 DIAGNOSIS — E785 Hyperlipidemia, unspecified: Secondary | ICD-10-CM

## 2012-04-27 DIAGNOSIS — E1149 Type 2 diabetes mellitus with other diabetic neurological complication: Secondary | ICD-10-CM

## 2012-04-27 NOTE — Assessment & Plan Note (Signed)
Improved control with diet changes and weight loss. Encuraged pt to add exercsie. Recheck in 6 months.

## 2012-04-27 NOTE — Progress Notes (Signed)
  Subjective:    Patient ID: [name] [name], male    DOB: Nov 15, 1955, 57 y.o.   MRN: [id number]  HPI  57 year old male presents for 3 month follow up.  Diabetes:  Improved A1C. On no medicaiton. Lab Results  Component Value Date   HGBA1C 6.7* 04/20/2012  Using medications without difficulties:  Hypoglycemic episodes:?  Hyperglycemic episodes:?  Feet problems: Neuropathy has improved.  Blood Sugars averaging:Not checking  eye exam within last year: NOt done yet, due.   Hypertension: Well controlled at goal <130/80. Lisinopril. Using medication without problems or lightheadedness: None  Chest pain with exertion: None  Edema:None  Short of breath:None  Average home BPs: not checking Other issues:   Wt Readings from Last 3 Encounters:  04/27/12 210 lb (95.255 kg)  03/26/12 212 lb (96.163 kg)  03/12/12 212 lb 12.8 oz (96.525 kg)  Has lost 4.5 lbs in last 3 months.  Exercise: active lifestyle at work, but limited specific exercise.      Review of Systems  Constitutional: Negative for fever and fatigue.  HENT: Negative for ear pain.   Eyes: Negative for pain.  Respiratory: Negative for shortness of breath.   Cardiovascular: Negative for chest pain.  Gastrointestinal: Negative for abdominal pain.       Objective:   Physical Exam  Constitutional: Vital signs are normal. He appears well-developed and well-nourished.  HENT:  Head: Normocephalic.  Right Ear: Hearing normal.  Left Ear: Hearing normal.  Nose: Nose normal.  Mouth/Throat: Oropharynx is clear and moist and mucous membranes are normal.  Neck: Trachea normal. Carotid bruit is not present. No mass and no thyromegaly present.  Cardiovascular: Normal rate, regular rhythm and normal pulses.  Exam reveals no gallop, no distant heart sounds and no friction rub.   No murmur heard. No peripheral edema  Pulmonary/Chest: Effort normal and breath sounds normal. No respiratory distress.  Skin: Skin is warm, dry and  intact. No rash noted.  Psychiatric: He has a normal mood and affect. His speech is normal and behavior is normal. Thought content normal.   Diabetic foot exam: Normal inspection No skin breakdown No calluses  Normal DP pulses Normal sensation to light touch and monofilament Nails normal        Assessment & Plan:

## 2012-04-27 NOTE — Patient Instructions (Addendum)
Schedule diabetes eye exam. Keep working on regular exercise and weight loss.

## 2012-04-27 NOTE — Assessment & Plan Note (Signed)
Well controlled on lisinopril. Encouraged exercise, weight loss, healthy eating habits.

## 2012-09-04 ENCOUNTER — Other Ambulatory Visit: Payer: Self-pay | Admitting: Family Medicine

## 2012-10-23 ENCOUNTER — Other Ambulatory Visit: Payer: 59

## 2012-10-30 ENCOUNTER — Ambulatory Visit: Payer: 59 | Admitting: Family Medicine

## 2012-11-06 ENCOUNTER — Other Ambulatory Visit: Payer: Self-pay | Admitting: Family Medicine

## 2012-11-06 NOTE — Telephone Encounter (Signed)
Last office visit 04/27/2012.  Ok to refill?

## 2013-01-10 ENCOUNTER — Other Ambulatory Visit: Payer: Self-pay | Admitting: *Deleted

## 2013-01-10 MED ORDER — LISINOPRIL 40 MG PO TABS: 40.0000 mg | ORAL_TABLET | Freq: Every day | ORAL | Status: DC

## 2013-04-16 ENCOUNTER — Telehealth: Payer: Self-pay | Admitting: Family Medicine

## 2013-04-16 ENCOUNTER — Ambulatory Visit (INDEPENDENT_AMBULATORY_CARE_PROVIDER_SITE_OTHER): Payer: No Typology Code available for payment source | Admitting: Family Medicine

## 2013-04-16 ENCOUNTER — Encounter: Payer: Self-pay | Admitting: Family Medicine

## 2013-04-16 VITALS — BP 128/80 | HR 67 | Temp 98.3°F | Wt 197.5 lb

## 2013-04-16 DIAGNOSIS — K219 Gastro-esophageal reflux disease without esophagitis: Secondary | ICD-10-CM

## 2013-04-16 MED ORDER — SUCRALFATE 1 GM/10ML PO SUSP: 1.0000 g | Freq: Three times a day (TID) | ORAL | Status: DC

## 2013-04-16 NOTE — Telephone Encounter (Signed)
Relevant patient education assigned to patient using Emmi. ° °

## 2013-04-16 NOTE — Telephone Encounter (Signed)
Patient Information:  Caller Name: Stanton Kidney  Phone: [phone]  Patient: [name] [name], [name] [name]  Gender: Male  DOB: 05/23/1955  Age: 58 Years  PCP: [name] (Family Practice)  Office Follow Up:  Does the office need to follow up with this patient?: No  Instructions For The Office: N/A   Symptoms  Reason For Call & Symptoms: Calling about having intermittent pain in chest for past few days. Takes Prilosec daily for reflux and is also taking Mylanta prn for past 24 hours which helps pain. He is dieting and not as eating as much and drinks coffee and beer daily. He thinks he has hiatal hernia or worsening indigestion. Hx HTN. Advised to call back if pain severe or lasts > 45min.  Reviewed Health History In EMR: Yes  Reviewed Medications In EMR: Yes  Reviewed Allergies In EMR: Yes  Reviewed Surgeries / Procedures: Yes  Date of Onset of Symptoms: 04/14/2013  Treatments Tried: Mylanta  Treatments Tried Worked: Yes  Guideline(s) Used:  Chest Pain  Disposition Per Guideline:   See Today in Office  Reason For Disposition Reached:   Intermittent chest pains persist > 3 days  Advice Given:  Fleeting Chest Pain:  Fleeting chest pains that last only a few seconds and then go away are generally not serious. They may be from pinched muscles or nerves in your chest wall.  Expected Course:  These mild chest pains usually disappear within 3 days.  Call Back If:  Severe chest pain  Constant chest pain lasting longer than 5 minutes  Difficulty breathing  Fever  You become worse.  Patient Will Follow Care Advice:  YES  Appointment Scheduled:  04/16/2013 15:00:00 Appointment Scheduled Provider:  Elsie Stain Brigitte Pulse) [facility])

## 2013-04-16 NOTE — Progress Notes (Signed)
Pre visit review using our clinic review tool, if applicable. No additional management support is needed unless otherwise documented below in the visit note.  H/o stomach ulcer prev.  On PPI.  Sx started about 4 days ago.  Sx going on consistently since then, can wax and wane but not fully resolved in the interval.  Has used mylanta with this episode and had sig improvement in meantime.  He had diffuse pain across the chest prev, now with less pain and now only at the sternum.  Not SOB.  No vomiting.  Some feeling of food sticking, going on for years, feels like it is sticking in the lower chest and not the throat.  No blood in stool.  Has taking some ibuprofen, occ, low dose, recently, w/o relief. Sx predate nsaid use.  No exertional sx.  No syncope.  BLE edema at baseline.  H/o numbness in feet from DM2.  Frequent acid taste in mouth. He's taking mylanta about 2-3 times a day in the last few days, with improvement.  Meds, vitals, and allergies reviewed.   ROS: See HPI.  Otherwise, noncontributory.  GEN: nad, alert and oriented, overweight.  HEENT: mucous membranes moist NECK: supple w/o LA CV: rrr.  PULM: ctab, no inc wob, ttp at the sternum ABD: soft, +bs, not ttp  EXT: no edema SKIN: no acute rash

## 2013-04-16 NOTE — Patient Instructions (Signed)
Elevate the head of your bed and take sucralfate 4 times a day along with the prilosec.   Rosaria Ferries will call about your referral. Take care.

## 2013-04-17 ENCOUNTER — Telehealth: Payer: Self-pay | Admitting: *Deleted

## 2013-04-17 NOTE — Telephone Encounter (Signed)
Received a faxed note from pharmacy showing that patient is requesting change to Sucralfate 1 gm tablets, $20 co-pay, $131.99 for Carafate susp. Form is in your in box. Please advise.

## 2013-04-17 NOTE — Assessment & Plan Note (Signed)
Add on sucralfate, elevated head of bed and refer to GI for consideration of EGD. D/w pt. He could have HH +/- stricture and I would like GI input. He agrees. Okay for outpatient f/u.  This looks not to be cardiopulmonary in origin. He agrees.

## 2013-04-18 NOTE — Telephone Encounter (Signed)
[name] [name] calls to ck on status of form; advised done and will be faxed back to pharmacy. [name] [name] request cb [phone] when faxed.

## 2013-04-18 NOTE — Telephone Encounter (Signed)
Form done.  Please fax in.  Thanks.  

## 2013-04-18 NOTE — Telephone Encounter (Signed)
Form faxed to [facility] as instructed;Stephanie at [facility] received form; Mrs Volden notified done. Form sent for scanning.

## 2013-04-23 ENCOUNTER — Ambulatory Visit (INDEPENDENT_AMBULATORY_CARE_PROVIDER_SITE_OTHER)
Admission: RE | Admit: 2013-04-23 | Discharge: 2013-04-23 | Disposition: A | Discharge status: Home / Self Care | Payer: No Typology Code available for payment source | Source: Ambulatory Visit | Attending: Family Medicine | Admitting: Family Medicine

## 2013-04-23 ENCOUNTER — Emergency Department (HOSPITAL_COMMUNITY): Payer: No Typology Code available for payment source

## 2013-04-23 ENCOUNTER — Observation Stay (HOSPITAL_COMMUNITY)
Admission: EM | Admit: 2013-04-23 | Discharge: 2013-04-25 | Disposition: A | Discharge status: Home / Self Care | Payer: No Typology Code available for payment source | Attending: Internal Medicine | Admitting: Internal Medicine

## 2013-04-23 ENCOUNTER — Encounter (HOSPITAL_COMMUNITY): Payer: Self-pay | Admitting: Emergency Medicine

## 2013-04-23 ENCOUNTER — Encounter: Payer: Self-pay | Admitting: Family Medicine

## 2013-04-23 ENCOUNTER — Telehealth: Payer: Self-pay | Admitting: Family Medicine

## 2013-04-23 ENCOUNTER — Ambulatory Visit (INDEPENDENT_AMBULATORY_CARE_PROVIDER_SITE_OTHER): Payer: No Typology Code available for payment source | Admitting: Family Medicine

## 2013-04-23 VITALS — BP 132/72 | HR 98 | Temp 98.9°F | Wt 194.8 lb

## 2013-04-23 DIAGNOSIS — K219 Gastro-esophageal reflux disease without esophagitis: Secondary | ICD-10-CM

## 2013-04-23 DIAGNOSIS — I10 Essential (primary) hypertension: Secondary | ICD-10-CM | POA: Insufficient documentation

## 2013-04-23 DIAGNOSIS — R079 Chest pain, unspecified: Principal | ICD-10-CM | POA: Insufficient documentation

## 2013-04-23 DIAGNOSIS — R0789 Other chest pain: Secondary | ICD-10-CM | POA: Insufficient documentation

## 2013-04-23 DIAGNOSIS — E1149 Type 2 diabetes mellitus with other diabetic neurological complication: Secondary | ICD-10-CM | POA: Diagnosis present

## 2013-04-23 DIAGNOSIS — G479 Sleep disorder, unspecified: Secondary | ICD-10-CM

## 2013-04-23 DIAGNOSIS — K449 Diaphragmatic hernia without obstruction or gangrene: Secondary | ICD-10-CM | POA: Insufficient documentation

## 2013-04-23 DIAGNOSIS — R945 Abnormal results of liver function studies: Secondary | ICD-10-CM

## 2013-04-23 DIAGNOSIS — R001 Bradycardia, unspecified: Secondary | ICD-10-CM

## 2013-04-23 DIAGNOSIS — Z8711 Personal history of peptic ulcer disease: Secondary | ICD-10-CM | POA: Insufficient documentation

## 2013-04-23 DIAGNOSIS — M109 Gout, unspecified: Secondary | ICD-10-CM | POA: Insufficient documentation

## 2013-04-23 DIAGNOSIS — R55 Syncope and collapse: Secondary | ICD-10-CM

## 2013-04-23 DIAGNOSIS — E1140 Type 2 diabetes mellitus with diabetic neuropathy, unspecified: Secondary | ICD-10-CM

## 2013-04-23 DIAGNOSIS — E538 Deficiency of other specified B group vitamins: Secondary | ICD-10-CM

## 2013-04-23 DIAGNOSIS — R7989 Other specified abnormal findings of blood chemistry: Secondary | ICD-10-CM

## 2013-04-23 DIAGNOSIS — E119 Type 2 diabetes mellitus without complications: Secondary | ICD-10-CM | POA: Insufficient documentation

## 2013-04-23 DIAGNOSIS — E785 Hyperlipidemia, unspecified: Secondary | ICD-10-CM | POA: Insufficient documentation

## 2013-04-23 DIAGNOSIS — Z8249 Family history of ischemic heart disease and other diseases of the circulatory system: Secondary | ICD-10-CM | POA: Insufficient documentation

## 2013-04-23 DIAGNOSIS — K279 Peptic ulcer, site unspecified, unspecified as acute or chronic, without hemorrhage or perforation: Secondary | ICD-10-CM | POA: Diagnosis present

## 2013-04-23 HISTORY — DX: Diaphragmatic hernia without obstruction or gangrene: K44.9

## 2013-04-23 HISTORY — DX: Type 2 diabetes mellitus without complications: E11.9

## 2013-04-23 LAB — COMPREHENSIVE METABOLIC PANEL
ALK PHOS: 103 U/L (ref 39–117)
ALT: 19 U/L (ref 0–53)
AST: 51 U/L — ABNORMAL HIGH (ref 0–37)
Albumin: 4 g/dL (ref 3.5–5.2)
BUN: 10 mg/dL (ref 6–23)
CALCIUM: 9.7 mg/dL (ref 8.4–10.5)
CO2: 30 mEq/L (ref 19–32)
CREATININE: 0.83 mg/dL (ref 0.50–1.35)
Chloride: 90 mEq/L — ABNORMAL LOW (ref 96–112)
Glucose, Bld: 138 mg/dL — ABNORMAL HIGH (ref 70–99)
Potassium: 4.5 mEq/L (ref 3.5–5.3)
Sodium: 131 mEq/L — ABNORMAL LOW (ref 135–145)
Total Bilirubin: 0.5 mg/dL (ref 0.2–1.2)
Total Protein: 6.8 g/dL (ref 6.0–8.3)

## 2013-04-23 LAB — CBC WITH DIFFERENTIAL/PLATELET
BASOS PCT: 1 % (ref 0–1)
Basophils Absolute: 0.1 10*3/uL (ref 0.0–0.1)
Eosinophils Absolute: 0.1 10*3/uL (ref 0.0–0.7)
Eosinophils Relative: 1 % (ref 0–5)
HEMATOCRIT: 42.1 % (ref 39.0–52.0)
HEMOGLOBIN: 15.3 g/dL (ref 13.0–17.0)
LYMPHS ABS: 2 10*3/uL (ref 0.7–4.0)
Lymphocytes Relative: 21 % (ref 12–46)
MCH: 34.8 pg — AB (ref 26.0–34.0)
MCHC: 36.3 g/dL — AB (ref 30.0–36.0)
MCV: 95.7 fL (ref 78.0–100.0)
MONO ABS: 1.2 10*3/uL — AB (ref 0.1–1.0)
Monocytes Relative: 13 % — ABNORMAL HIGH (ref 3–12)
NEUTROS ABS: 6 10*3/uL (ref 1.7–7.7)
Neutrophils Relative %: 64 % (ref 43–77)
Platelets: 205 10*3/uL (ref 150–400)
RBC: 4.4 MIL/uL (ref 4.22–5.81)
RDW: 12.7 % (ref 11.5–15.5)
WBC: 9.3 10*3/uL (ref 4.0–10.5)

## 2013-04-23 LAB — D-DIMER, QUANTITATIVE: D-Dimer, Quant: 5.53 ug/mL-FEU — ABNORMAL HIGH (ref 0.00–0.48)

## 2013-04-23 LAB — TROPONIN I: Troponin I: 0.01 ng/mL (ref <0.06)

## 2013-04-23 MED ORDER — IOHEXOL 350 MG/ML SOLN
100.0000 mL | Freq: Once | INTRAVENOUS | Status: AC | PRN
  Administered 2013-04-23: 100 mL via INTRAVENOUS

## 2013-04-23 MED ORDER — PANTOPRAZOLE SODIUM 40 MG PO TBEC: 40.0000 mg | DELAYED_RELEASE_TABLET | Freq: Every day | ORAL | Status: DC

## 2013-04-23 MED ORDER — MORPHINE SULFATE 4 MG/ML IJ SOLN
4.0000 mg | Freq: Once | INTRAMUSCULAR | Status: AC
  Administered 2013-04-23: 4 mg via INTRAVENOUS
  Filled 2013-04-23: qty 1

## 2013-04-23 MED ORDER — ASPIRIN 81 MG PO CHEW
324.0000 mg | CHEWABLE_TABLET | Freq: Once | ORAL | Status: AC
  Administered 2013-04-23: 324 mg via ORAL
  Filled 2013-04-23: qty 4

## 2013-04-23 MED ORDER — HYDROCODONE-ACETAMINOPHEN 10-325 MG PO TABS: 1.0000 | ORAL_TABLET | Freq: Four times a day (QID) | ORAL | Status: DC | PRN

## 2013-04-23 NOTE — Patient Instructions (Addendum)
Change to pantoprazole 40 mg daily.  Can use tylenol, but avoid ibuprofen, aleve, aspirin etc. Can stop carafate.  Follow up on Friday.  We will call with X-ray results.  If pain more severe ... Go to ER ASAP

## 2013-04-23 NOTE — H&P (Signed)
PCP:   Kerby Nora, MD   Chief Complaint:  Cp, abnormal lab  HPI: 58 yo male h/o htn, hld, dm, PUD with bleeding ulcer in his early 70s, recent intentional 20 lbs wt loss, went to see pcp today for sscp that has been going on for 2 weeks with radiation to both shoulders.  The pain is worse at the end of the day, is worse after eating.  No matter what he eats, the pain is worse.  The pain can last for hours.  First thing in the morning when he wakes, there is no pain.  No sob or n/v with it.  No le edema or swelling.  He has long standing h/o gerd and this is worse than his gerd.  Denies melena or brbpr.  His pcp checked a ddimer today and it was elevated, so he was told to come to ED. She started him on protonix today.  Chest pain relieved with morphine in ED just given now, no ntg given.  Asa just given also.  No complaints of this pain now.  No cough, no fevers.  Still has gallbladder.  Pain not worse with breathing.  Review of Systems:  Positive and negative as per HPI otherwise all other systems are negative  Past Medical History: Past Medical History  Diagnosis Date  . GERD (gastroesophageal reflux disease)   . Hypertension   . Ulcer   . Hyperlipidemia   . Gout   . Hiatal hernia   . Diabetes mellitus without complication    Past Surgical History  Procedure Laterality Date  . Pilonidal cyst / sinus excision    . 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 Authorizing Provider  acetaminophen (TYLENOL) 500 MG tablet Take 1,000 mg by mouth every 6 (six) hours as needed for moderate pain.   Yes Historical Provider, MD  HYDROcodone-acetaminophen (NORCO/VICODIN) 5-325 MG per tablet Take 1 tablet by mouth every 6 (six) hours as needed for moderate pain.   Yes Historical Provider, MD  ibuprofen (ADVIL,MOTRIN) 200 MG tablet Take 400 mg by mouth every 6 (six) hours as needed for moderate pain.   Yes  Historical Provider, MD  lisinopril (PRINIVIL,ZESTRIL) 40 MG tablet Take 1 tablet (40 mg total) by mouth daily. Please reschedule your 6 month follow up appointment for additional refills. 01/10/13  Yes Amy Michelle Nasuti, MD  metoprolol succinate (TOPROL-XL) 100 MG 24 hr tablet Take 100 mg by mouth daily. Take with or immediately following a meal.   Yes Historical Provider, MD  pantoprazole (PROTONIX) 40 MG tablet Take 1 tablet (40 mg total) by mouth daily. 04/23/13  Yes Amy Michelle Nasuti, MD  spironolactone (ALDACTONE) 50 MG tablet Take 50 mg by mouth daily.   Yes Historical Provider, MD    Allergies:   Allergies  Allergen Reactions  . Penicillins Hives    Social History:  reports that he has never smoked. His smokeless tobacco use includes Chew. He reports that he drinks about 14.4 ounces of alcohol per week. He reports that he does not use illicit drugs.  Family History: Family History  Problem Relation Age of Onset  . Gout Father   . COPD Father   . Diabetes type II Father   . Colon cancer Neg Hx   . Stomach cancer Neg Hx   . Diabetes Mother     Physical Exam: Filed Vitals:  04/23/13 2254 04/23/13 2300 04/23/13 2315 04/23/13 2330  BP: 120/71 113/61 103/62 115/67  Pulse: 65 66 68 65  Temp:      TempSrc:      Resp: 19 12 14 17   Weight:      SpO2: 97% 90% 91% 98%   General appearance: alert, cooperative and no distress Head: Normocephalic, without obvious abnormality, atraumatic Eyes: negative Nose: Nares normal. Septum midline. Mucosa normal. No drainage or sinus tenderness. Neck: no JVD and supple, symmetrical, trachea midline Lungs: clear to auscultation bilaterally Heart: regular rate and rhythm, S1, S2 normal, no murmur, click, rub or gallop Abdomen: soft, non-tender; bowel sounds normal; no masses,  no organomegaly Extremities: extremities normal, atraumatic, no cyanosis or edema Pulses: 2+ and symmetric Skin: Skin color, texture, turgor normal. No rashes or  lesions Neurologic: Grossly normal    Labs on Admission:   Recent Labs  04/23/13 1710  NA 131*  K 4.5  CL 90*  CO2 30  GLUCOSE 138*  BUN 10  CREATININE 0.83  CALCIUM 9.7    Recent Labs  04/23/13 1710  AST 51*  ALT 19  ALKPHOS 103  BILITOT 0.5  PROT 6.8  ALBUMIN 4.0    Recent Labs  04/23/13 1710  WBC 9.3  NEUTROABS 6.0  HGB 15.3  HCT 42.1  MCV 95.7  PLT 205    Recent Labs  04/23/13 1710  TROPONINI 0.01   Radiological Exams on Admission: Dg Chest 2 View  04/23/2013   CLINICAL DATA:  Chest pain  EXAM: CHEST  2 VIEW  COMPARISON:  None  FINDINGS: The heart size and mediastinal contours are within normal limits. Both lungs are clear. The visualized skeletal structures are unremarkable. 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  IMPRESSION: No active cardiopulmonary disease.   Electronically Signed   By: [name] M.D.   On: 04/23/2013 18:10   Ct Angio Chest Pe W/cm &/or Wo Cm  04/23/2013   CLINICAL DATA:  Elevated D-dimer.  Chest pain.  EXAM: CT ANGIOGRAPHY CHEST WITH CONTRAST  TECHNIQUE: Multidetector CT imaging of the chest was performed using the standard protocol during bolus administration of intravenous contrast. Multiplanar CT image reconstructions and MIPs were obtained to evaluate the vascular anatomy.  CONTRAST:  126mL OMNIPAQUE IOHEXOL 350 MG/ML SOLN  COMPARISON:  DG CHEST 2 VIEW dated 04/23/2013  FINDINGS: Slight motion artifact in the mediastinum. Thoracic aorta normal in caliber. Atherosclerotic vascular changes are noted of the thoracic aorta. No aneurysm or dissection. Great vessels patent. Pulmonary arteries are intact. No pulmonary embolus. Cardiomegaly. Coronary artery disease. No pericardial effusion.  No significant mediastinal or hilar adenopathy. Thoracic esophagus unremarkable.  Large airways are patent. Lungs are clear of acute infiltrates. No pleural effusion or pneumothorax.  Visualized upper abdominal structures are  unremarkable.  Thyroid is unremarkable. No supraclavicular or axillary lymph nodes are noted. Chest wall is intact. Degenerative changes thoracic spine. No acute abnormality.  Review of the MIP images confirms the above findings.  IMPRESSION: 1. Coronary artery disease.  Cardiomegaly. 2. No evidence of pulmonary emboli.  No acute abnormality.   Electronically Signed   By: Marcello Moores  Register   On: 04/23/2013 22:51    Assessment/Plan  58 yo male with atypical chest pain with multiple cardiac risk factors  Principal Problem:   Acute chest pain-  Aspirin.  obs for romi.  Echo in am.  Will need full cardiac stress testing for complete w/u, however his pain sounds more consistent  with gi source.  Agree with starting protonix, which he got filled today, will give gi cocktail prn.  He has been using a lot of ibu otc at home, will stop this.  He also has used carafate which has not helped.  ekg nsr no acute changes.  Active Problems:   Type II or unspecified type diabetes mellitus with neurological manifestations, not stated as uncontrolled(250.60)   HYPERLIPIDEMIA   HYPERTENSION   PEPTIC ULCER DISEASE    [name] [name] A 04/23/2013, 11:41 PM

## 2013-04-23 NOTE — ED Notes (Signed)
Patient transported to CT 

## 2013-04-23 NOTE — Addendum Note (Signed)
Addended by: Ellamae Sia on: 04/23/2013 05:10 PM   Modules accepted: Orders

## 2013-04-23 NOTE — Progress Notes (Signed)
   Subjective:    Patient ID: [name] [name], male    DOB: 1955-04-27, 58 y.o.   MRN: [id number]  Gastrophageal Reflux He complains of chest pain and nausea. He reports no abdominal pain, no coughing or no wheezing.    58 year old male with history of  poorly control CAD,GERD, bleeding stomach ulcer presents for Follow up GERD.  He was seen on 3/10 by Dr. Damita Dunnings for chest pain after eating, food sticking, acid taste in mouth.  Had been taking ibuprofen but has stopped now.  Dr. Damita Dunnings added sucralfate , and referred him to GI.  Also on omeprazole 20 mg daily.  Today he report he continues to have chest pain... Radiates to neck B shoulders through to back. Describes as an ache, centrally. Pain is severe 10/10, he cannot function. Pain is constant, tylenol (he has been taking > 4000 mg a day) and mylanta helps some.  Feels like it is worse with exertion.. Throbbing. He has not been able to eat much.  no regurgitation of food, no acid in throat.  Feels like food gets stuck some, water flows easily. Feels nauseous. No emesis.  Carafate has not helped at all.  Symptoms are worse than last week.   He has family history of CAD in father, uncle with MI age 64.  He has never had stress test.    Wt Readings from Last 3 Encounters:  04/23/13 194 lb 12 oz (88.338 kg)  04/16/13 197 lb 8 oz (89.585 kg)  04/27/12 210 lb (95.255 kg)        Review of Systems  Constitutional: Positive for diaphoresis.  HENT: Negative for congestion, ear pain and sinus pressure.   Respiratory: Negative for cough, shortness of breath and wheezing.   Cardiovascular: Positive for chest pain and leg swelling. Negative for palpitations.  Gastrointestinal: Positive for nausea and diarrhea. Negative for abdominal pain, constipation and abdominal distention.       Feels gassy and burping at times  Musculoskeletal: Negative for arthralgias.       Objective:   Physical Exam  Constitutional: Vital signs are  normal. He appears well-developed and well-nourished.  Non toxic appearing male in no distress... Report of pain exceeds exam and appearance  HENT:  Head: Normocephalic.  Right Ear: Hearing normal.  Left Ear: Hearing normal.  Nose: Nose normal.  Mouth/Throat: Oropharynx is clear and moist and mucous membranes are normal.  Neck: Trachea normal. Carotid bruit is not present. No mass and no thyromegaly present.  Cardiovascular: Normal rate, regular rhythm and normal pulses.  Exam reveals no gallop, no distant heart sounds and no friction rub.   No murmur heard. No peripheral edema  No carotid or aortic bruit  Pulmonary/Chest: Effort normal and breath sounds normal. No respiratory distress. He has no decreased breath sounds. He has no wheezes. He has no rhonchi. He has no rales.  No chest wall ttp  Skin: Skin is warm, dry and intact. No rash noted.  Psychiatric: He has a normal mood and affect. His speech is normal and behavior is normal. Thought content normal.          Assessment & Plan:

## 2013-04-23 NOTE — Telephone Encounter (Signed)
Relevant patient education assigned to patient using Emmi. ° °

## 2013-04-23 NOTE — Progress Notes (Signed)
Pre visit review using our clinic review tool, if applicable. No additional management support is needed unless otherwise documented below in the visit note. 

## 2013-04-23 NOTE — ED Provider Notes (Signed)
CSN: [id number]     Arrival date & time 04/23/13  2110 History   None    Chief Complaint  Patient presents with  . Abnormal Lab     (Consider location/radiation/quality/duration/timing/severity/associated sxs/prior Treatment) HPI Complains of anterior chest pain x2 weeks, waxes and wanes. Worse with changing position or with exertion. He continues on with Tylenol and Vicodin with relief presently discomfort is minimal. Seen by primary care physician Dr. Diona Browner today sent here for further evaluation. Had elevated d-dimer on laboratory work. Denies shortness of breath denies nausea denies sweatiness. Past Medical History  Diagnosis Date  . GERD (gastroesophageal reflux disease)   . Hypertension   . Ulcer   . Hyperlipidemia   . Gout   . Hiatal hernia    diet-controlled diabetes Past Surgical History  Procedure Laterality Date  . Pilonidal cyst / sinus excision    . 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 Gout Father   . COPD Father   . Diabetes type II Father   . Colon cancer Neg Hx   . Stomach cancer Neg Hx   . Diabetes Mother    History  Substance Use Topics  . Smoking status: Never Smoker   . Smokeless tobacco: Current User    Types: Chew  . Alcohol Use: 14.4 oz/week    24 Cans of beer per week   family history father had MI age 80  Review of Systems  Constitutional: Negative.   HENT: Negative.   Respiratory: Negative.   Cardiovascular: Positive for chest pain.  Gastrointestinal: Negative.   Musculoskeletal: Negative.   Skin: Negative.   Neurological: Negative.   Psychiatric/Behavioral: Negative.   All other systems reviewed and are negative.      Allergies  Penicillins  Home Medications   Current Outpatient Rx  Name  Route  Sig  Dispense  Refill  . HYDROcodone-acetaminophen (NORCO) 10-325 MG per tablet   Oral   Take 1-2 tablets by mouth every 6 (six) hours as needed.   30 tablet   0   .  lisinopril (PRINIVIL,ZESTRIL) 40 MG tablet   Oral   Take 1 tablet (40 mg total) by mouth daily. Please reschedule your 6 month follow up appointment for additional refills.   90 tablet   0   . metoprolol (LOPRESSOR) 100 MG tablet      TAKE 1/2 TABLET BY MOUTH TWO TIMES A DAY   30 tablet   6   . pantoprazole (PROTONIX) 40 MG tablet   Oral   Take 1 tablet (40 mg total) by mouth daily.   30 tablet   3   . spironolactone (ALDACTONE) 50 MG tablet      TAKE 1 TABLET BY MOUTH DAILY.   90 tablet   3    BP 122/86  Pulse 80  Temp(Src) 98.1 F (36.7 C) (Oral)  Resp 20  Wt 196 lb 5 oz (89.047 kg)  SpO2 98% Physical Exam  Nursing note and vitals reviewed. Constitutional: He appears well-developed and well-nourished.  HENT:  Head: Normocephalic and atraumatic.  Eyes: Conjunctivae are normal. Pupils are equal, round, and reactive to light.  Neck: Neck supple. No tracheal deviation present. No thyromegaly present.  Cardiovascular: Normal rate and regular rhythm.   No murmur heard. Pulmonary/Chest: Effort normal and breath sounds normal.  Abdominal: Soft. Bowel sounds are normal. He exhibits no distension. There is no tenderness.  Obese  Musculoskeletal:  Normal range of motion. He exhibits no edema and no tenderness.  Neurological: He is alert. Coordination normal.  Skin: Skin is warm and dry. No rash noted.  Psychiatric: He has a normal mood and affect.    ED Course  Procedures (including critical care time) Labs Review Labs Reviewed - No data to display Imaging Review Dg Chest 2 View  04/23/2013   CLINICAL DATA:  Chest pain  EXAM: CHEST  2 VIEW  COMPARISON:  None  FINDINGS: The heart size and mediastinal contours are within normal limits. Both lungs are clear. The visualized skeletal structures are unremarkable. 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  IMPRESSION: No active cardiopulmonary disease.   Electronically Signed   By: [name] M.D.   On:  04/23/2013 18:10     EKG Interpretation   Date/Time:  Tuesday April 23 2013 21:34:27 EDT Ventricular Rate:  65 PR Interval:  142 QRS Duration: 86 QT Interval:  414 QTC Calculation: 430 R Axis:   -45 Text Interpretation:  Normal sinus rhythm Left axis deviation Nonspecific  ST and T wave abnormality Abnormal ECG No significant change since last  tracing Confirmed by Jj Enyeart  MD, Ossie Yebra [phone]) on 04/23/2013 10:15:09 PM     1250 Patient asymptomatic pain-free after treatment with intravenous morphine and oral aspirin.  Results for orders placed in visit on 04/23/13  D-DIMER, QUANTITATIVE      Result Value Ref Range   D-Dimer, Quant 5.53 (*) 0.00 - 0.48 ug/mL-FEU  TROPONIN I      Result Value Ref Range   Troponin I 0.01  <0.06 ng/mL  COMPREHENSIVE METABOLIC PANEL      Result Value Ref Range   Sodium 131 (*) 135 - 145 mEq/L   Potassium 4.5  3.5 - 5.3 mEq/L   Chloride 90 (*) 96 - 112 mEq/L   CO2 30  19 - 32 mEq/L   Glucose, Bld 138 (*) 70 - 99 mg/dL   BUN 10  6 - 23 mg/dL   Creat 0.83  0.50 - 1.35 mg/dL   Total Bilirubin 0.5  0.2 - 1.2 mg/dL   Alkaline Phosphatase 103  39 - 117 U/L   AST 51 (*) 0 - 37 U/L   ALT 19  0 - 53 U/L   Total Protein 6.8  6.0 - 8.3 g/dL   Albumin 4.0  3.5 - 5.2 g/dL   Calcium 9.7  8.4 - 10.5 mg/dL  CBC WITH DIFFERENTIAL      Result Value Ref Range   WBC 9.3  4.0 - 10.5 K/uL   RBC 4.40  4.22 - 5.81 MIL/uL   Hemoglobin 15.3  13.0 - 17.0 g/dL   HCT 42.1  39.0 - 52.0 %   MCV 95.7  78.0 - 100.0 fL   MCH 34.8 (*) 26.0 - 34.0 pg   MCHC 36.3 (*) 30.0 - 36.0 g/dL   RDW 12.7  11.5 - 15.5 %   Platelets 205  150 - 400 K/uL   Neutrophils Relative % 64  43 - 77 %   Neutro Abs 6.0  1.7 - 7.7 K/uL   Lymphocytes Relative 21  12 - 46 %   Lymphs Abs 2.0  0.7 - 4.0 K/uL   Monocytes Relative 13 (*) 3 - 12 %   Monocytes Absolute 1.2 (*) 0.1 - 1.0 K/uL   Eosinophils Relative 1  0 - 5 %   Eosinophils Absolute 0.1  0.0 - 0.7 K/uL   Basophils  Relative 1  0 - 1 %    Basophils Absolute 0.1  0.0 - 0.1 K/uL   Smear Review Criteria for review not met     Dg Chest 2 View  04/23/2013   CLINICAL DATA:  Chest pain  EXAM: CHEST  2 VIEW  COMPARISON:  None  FINDINGS: The heart size and mediastinal contours are within normal limits. Both lungs are clear. The visualized skeletal structures are unremarkable. 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  IMPRESSION: No active cardiopulmonary disease.   Electronically Signed   By: [name] M.D.   On: 04/23/2013 18:10   Ct Angio Chest Pe W/cm &/or Wo Cm  04/23/2013   CLINICAL DATA:  Elevated D-dimer.  Chest pain.  EXAM: CT ANGIOGRAPHY CHEST WITH CONTRAST  TECHNIQUE: Multidetector CT imaging of the chest was performed using the standard protocol during bolus administration of intravenous contrast. Multiplanar CT image reconstructions and MIPs were obtained to evaluate the vascular anatomy.  CONTRAST:  140mL OMNIPAQUE IOHEXOL 350 MG/ML SOLN  COMPARISON:  DG CHEST 2 VIEW dated 04/23/2013  FINDINGS: Slight motion artifact in the mediastinum. Thoracic aorta normal in caliber. Atherosclerotic vascular changes are noted of the thoracic aorta. No aneurysm or dissection. Great vessels patent. Pulmonary arteries are intact. No pulmonary embolus. Cardiomegaly. Coronary artery disease. No pericardial effusion.  No significant mediastinal or hilar adenopathy. Thoracic esophagus unremarkable.  Large airways are patent. Lungs are clear of acute infiltrates. No pleural effusion or pneumothorax.  Visualized upper abdominal structures are unremarkable.  Thyroid is unremarkable. No supraclavicular or axillary lymph nodes are noted. Chest wall is intact. Degenerative changes thoracic spine. No acute abnormality.  Review of the MIP images confirms the above findings.  IMPRESSION: 1. Coronary artery disease.  Cardiomegaly. 2. No evidence of pulmonary emboli.  No acute abnormality.   Electronically Signed   By: Marcello Moores  Register   On:  04/23/2013 22:51    MDM   Final diagnoses:  None   heart score equals 4. Spoke with Dr. Shanon Brow who arranged for inpatient stay Dx #1 chest pain #2 hyperglycemia     Orlie Dakin, MD 04/24/13 [phone]

## 2013-04-23 NOTE — Assessment & Plan Note (Signed)
Concerning given level of pain. EKG appear stable. Will eval CXR for aortic aneurysm, no suggestion of lung issue given clear lungs and no SOB. Eval stat labs for PE (although unlikely), MI. Most likely pain from esophagitis, no epigastric pain suggesting gastritis.   Change omeprazole to pantoprazole at 40 mg daily. Close follow up, but pt understands to go to ER if symtpoms worsening.   Will eval LFTs given high use of tylenol as well. Pt to stop tylenol, can use vicodin as needed for breakthrough pain.

## 2013-04-23 NOTE — ED Notes (Signed)
Patient returned from CT

## 2013-04-23 NOTE — ED Notes (Addendum)
Pt st's he was seen by his MD today and had lab work and EKG.  Was called and told to come to ED  Pt st's he has seen his MD x's 2 over last two week ref.  Pain across chest and between shoulder blades

## 2013-04-24 ENCOUNTER — Encounter (HOSPITAL_COMMUNITY): Payer: Self-pay | Admitting: *Deleted

## 2013-04-24 DIAGNOSIS — E538 Deficiency of other specified B group vitamins: Secondary | ICD-10-CM

## 2013-04-24 DIAGNOSIS — E1142 Type 2 diabetes mellitus with diabetic polyneuropathy: Secondary | ICD-10-CM

## 2013-04-24 DIAGNOSIS — I519 Heart disease, unspecified: Secondary | ICD-10-CM

## 2013-04-24 LAB — TROPONIN I
Troponin I: <0.3 ng/mL (ref <0.30)
Troponin I: <0.3 ng/mL (ref <0.30)

## 2013-04-24 LAB — LIPASE, BLOOD: Lipase: 34 U/L (ref 11–59)

## 2013-04-24 LAB — CBG MONITORING, ED: GLUCOSE-CAPILLARY: 79 mg/dL (ref 70–99)

## 2013-04-24 LAB — GLUCOSE, CAPILLARY: Glucose-Capillary: 90 mg/dL (ref 70–99)

## 2013-04-24 MED ORDER — ACETAMINOPHEN 500 MG PO TABS: 1000.0000 mg | ORAL_TABLET | Freq: Four times a day (QID) | ORAL | Status: DC | PRN

## 2013-04-24 MED ORDER — OXYCODONE-ACETAMINOPHEN 5-325 MG PO TABS
1.0000 | ORAL_TABLET | ORAL | Status: DC | PRN
  Administered 2013-04-24 – 2013-04-25 (×8): 2 via ORAL
  Filled 2013-04-24 (×8): qty 2

## 2013-04-24 MED ORDER — LISINOPRIL 40 MG PO TABS
40.0000 mg | ORAL_TABLET | Freq: Every day | ORAL | Status: DC
  Administered 2013-04-24 – 2013-04-25 (×2): 40 mg via ORAL
  Filled 2013-04-24 (×2): qty 1

## 2013-04-24 MED ORDER — SPIRONOLACTONE 50 MG PO TABS
50.0000 mg | ORAL_TABLET | Freq: Every day | ORAL | Status: DC
  Administered 2013-04-24 – 2013-04-25 (×2): 50 mg via ORAL
  Filled 2013-04-24 (×2): qty 1

## 2013-04-24 MED ORDER — MORPHINE SULFATE 2 MG/ML IJ SOLN: 2.0000 mg | INTRAMUSCULAR | Status: DC | PRN

## 2013-04-24 MED ORDER — GI COCKTAIL ~~LOC~~
30.0000 mL | Freq: Four times a day (QID) | ORAL | Status: DC | PRN
  Administered 2013-04-24: 30 mL via ORAL
  Filled 2013-04-24 (×2): qty 30

## 2013-04-24 MED ORDER — ASPIRIN EC 325 MG PO TBEC
325.0000 mg | DELAYED_RELEASE_TABLET | Freq: Every day | ORAL | Status: DC
  Administered 2013-04-24 – 2013-04-25 (×2): 325 mg via ORAL
  Filled 2013-04-24 (×2): qty 1

## 2013-04-24 MED ORDER — METOPROLOL SUCCINATE ER 100 MG PO TB24
100.0000 mg | ORAL_TABLET | Freq: Every day | ORAL | Status: DC
  Administered 2013-04-24 – 2013-04-25 (×2): 100 mg via ORAL
  Filled 2013-04-24 (×3): qty 1

## 2013-04-24 MED ORDER — PANTOPRAZOLE SODIUM 40 MG PO TBEC
40.0000 mg | DELAYED_RELEASE_TABLET | Freq: Two times a day (BID) | ORAL | Status: DC
  Administered 2013-04-24 – 2013-04-25 (×3): 40 mg via ORAL
  Filled 2013-04-24 (×4): qty 1

## 2013-04-24 MED ORDER — PANTOPRAZOLE SODIUM 40 MG PO TBEC
40.0000 mg | DELAYED_RELEASE_TABLET | Freq: Every day | ORAL | Status: DC
Start: 2013-04-24 — End: 2013-04-24
  Administered 2013-04-24: 40 mg via ORAL
  Filled 2013-04-24: qty 1

## 2013-04-24 MED ORDER — HYDROCODONE-ACETAMINOPHEN 5-325 MG PO TABS
1.0000 | ORAL_TABLET | Freq: Four times a day (QID) | ORAL | Status: DC | PRN
  Administered 2013-04-24 (×2): 1 via ORAL
  Filled 2013-04-24 (×2): qty 1

## 2013-04-24 NOTE — Consult Note (Signed)
Reason for Consult: Recurrent chest pain Referring Physician: [facility]  [name] [name] is an 58 y.o. male.  HPI: Patient is 58 year old male with past medical history significant for hypertension, diabetes mellitus controlled by diet, GERD, history of peptic ulcer disease in the past, positive family history of coronary artery disease, father died of massive MI in his 17s and his uncle had MI at age of 88, EtOH abuse, was admitted yesterday because of recurrent retrosternal chest pain described as burning radiating to both shoulders took initially Mylanta and was placed on omeprazole and then protonix with minimal improvement  patient had d-dimer done as outpatient which was markedly elevated by PMD requiring admission to the hospital patient had CT of the chest which was negative for pulmonary embolism. EKG done in the ER showed normal sinus rhythm with nonspecific T wave changes and cardiac enzymes have been negative so far. Patient denies any exertional chest pain denies any palpitation lightheadedness or syncope denies pain the orthopnea leg swelling. Denies any cardiac workup in the past.  Past Medical History  Diagnosis Date  . GERD (gastroesophageal reflux disease)   . Hypertension   . Ulcer   . Hyperlipidemia   . Gout   . Hiatal hernia   . Diabetes mellitus without complication     Past Surgical History  Procedure Laterality Date  . Pilonidal cyst / sinus excision    . 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 Gout Father   . COPD Father   . Diabetes type II Father   . Colon cancer Neg Hx   . Stomach cancer Neg Hx   . Diabetes Mother     Social History:  reports that he has never smoked. His smokeless tobacco use includes Chew. He reports that he drinks about 14.4 ounces of alcohol per week. He reports that he does not use illicit drugs.  Allergies:  Allergies  Allergen Reactions  . Penicillins Hives     Medications: I have reviewed the patient's current medications.  Results for orders placed during the hospital encounter of 04/23/13 (from the past 48 hour(s))  LIPASE, BLOOD     Status: None   Collection Time    04/23/13 11:56 PM      Result Value Ref Range   Lipase 34  11 - 59 U/L  CBG MONITORING, ED     Status: None   Collection Time    04/24/13  1:52 AM      Result Value Ref Range   Glucose-Capillary 79  70 - 99 mg/dL  GLUCOSE, CAPILLARY     Status: None   Collection Time    04/24/13  6:22 AM      Result Value Ref Range   Glucose-Capillary 90  70 - 99 mg/dL  TROPONIN I     Status: None   Collection Time    04/24/13  8:44 AM      Result Value Ref Range   Troponin I <0.30  <0.30 ng/mL   Comment:            Due to the release kinetics of cTnI,     a negative result within the first hours     of the onset of symptoms does not rule out     myocardial infarction with certainty.     If myocardial infarction is still suspected,     repeat the test at appropriate intervals.  TROPONIN I     Status: None   Collection Time    04/24/13  1:36 PM      Result Value Ref Range   Troponin I <0.30  <0.30 ng/mL   Comment:            Due to the release kinetics of cTnI,     a negative result within the first hours     of the onset of symptoms does not rule out     myocardial infarction with certainty.     If myocardial infarction is still suspected,     repeat the test at appropriate intervals.    Dg Chest 2 View  04/23/2013   CLINICAL DATA:  Chest pain  EXAM: CHEST  2 VIEW  COMPARISON:  None  FINDINGS: The heart size and mediastinal contours are within normal limits. Both lungs are clear. The visualized skeletal structures are unremarkable. 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  IMPRESSION: No active cardiopulmonary disease.   Electronically Signed   By: [name] M.D.   On: 04/23/2013 18:10   Ct Angio Chest Pe W/cm &/or Wo Cm  04/23/2013   CLINICAL  DATA:  Elevated D-dimer.  Chest pain.  EXAM: CT ANGIOGRAPHY CHEST WITH CONTRAST  TECHNIQUE: Multidetector CT imaging of the chest was performed using the standard protocol during bolus administration of intravenous contrast. Multiplanar CT image reconstructions and MIPs were obtained to evaluate the vascular anatomy.  CONTRAST:  127mL OMNIPAQUE IOHEXOL 350 MG/ML SOLN  COMPARISON:  DG CHEST 2 VIEW dated 04/23/2013  FINDINGS: Slight motion artifact in the mediastinum. Thoracic aorta normal in caliber. Atherosclerotic vascular changes are noted of the thoracic aorta. No aneurysm or dissection. Great vessels patent. Pulmonary arteries are intact. No pulmonary embolus. Cardiomegaly. Coronary artery disease. No pericardial effusion.  No significant mediastinal or hilar adenopathy. Thoracic esophagus unremarkable.  Large airways are patent. Lungs are clear of acute infiltrates. No pleural effusion or pneumothorax.  Visualized upper abdominal structures are unremarkable.  Thyroid is unremarkable. No supraclavicular or axillary lymph nodes are noted. Chest wall is intact. Degenerative changes thoracic spine. No acute abnormality.  Review of the MIP images confirms the above findings.  IMPRESSION: 1. Coronary artery disease.  Cardiomegaly. 2. No evidence of pulmonary emboli.  No acute abnormality.   Electronically Signed   By: Marcello Moores  Register   On: 04/23/2013 22:51    Review of Systems  Constitutional: Negative for fever and chills.  Eyes: Negative for double vision and photophobia.  Respiratory: Negative for cough, hemoptysis and sputum production.   Cardiovascular: Positive for chest pain. Negative for palpitations, orthopnea, claudication, leg swelling and PND.  Gastrointestinal: Negative for heartburn, nausea and vomiting.  Genitourinary: Negative for dysuria.  Neurological: Negative for dizziness and headaches.   Blood pressure 115/67, pulse 65, temperature 99.8 F (37.7 C), temperature source Oral, resp.  rate 20, height 5\' 4"  (1.626 m), weight 87.136 kg (192 lb 1.6 oz), SpO2 97.00%. Physical Exam  Constitutional: He is oriented to person, place, and time.  HENT:  Head: Normocephalic and atraumatic.  Eyes: Conjunctivae are normal. Pupils are equal, round, and reactive to light. No scleral icterus.  Neck: Normal range of motion. Neck supple. No JVD present. No tracheal deviation present. No thyromegaly present.  Cardiovascular: Normal rate, regular rhythm and normal heart sounds.   No murmur heard. Respiratory: Effort normal and breath sounds normal. No respiratory distress. He has no wheezes. He has no rales.  GI: Soft.  Bowel sounds are normal. He exhibits no distension. There is no tenderness. There is no rebound.  Musculoskeletal: He exhibits no edema and no tenderness.  Neurological: He is alert and oriented to person, place, and time.    Assessment/Plan: Recurrent chest pain MI ruled out rule out coronary insufficiency Hypertension Type 2 diabetes mellitus GERD History of bleeding ulcers in the past EtOH abuse Morbid obesity Positive family history of coronary artery disease Plan Continue present management Will schedule him for nuclear stress test in a.m.  Clent Demark 04/24/2013, 4:38 PM

## 2013-04-24 NOTE — ED Notes (Signed)
Bed control called as patient hasn't received bed assignment or request. Flow indicated that problem existed with order and they will correct and assign bed soon.

## 2013-04-24 NOTE — Progress Notes (Signed)
Patient ID: [name] [name], male   DOB: December 31, 1955, 58 y.o.   MRN: [id number]  [facility] PROGRESS NOTE  [name] NUU:[id number] DOB: [birth year] DOA: 04/23/2013 PCP: [name], MD  Brief narrative: Pt is 58 yo male with history of HTN, acid reflux and PUD, presented to [facility] with main concern of progressively worsening substernal chest pain,, relatively persistent in nature and radiating to bilateral shoulder bladed, occasionally but not consistently radiating to bilateral groin area, no specific alleviating or aggravating factors, 7/10 in severity at worst and 5/10 in severity at best. Pt explains he typically takes PPI for acid reflux and it helps with symptoms but this time PPI's are not sufficient.  In ED, pt noted to be hemodynamically stable, d-dimer elevated at 5.5 and CT angio chest with CAD, cardiomegaly, no PE. [facility] asked to admit for further evaluation. First set of troponin negative.   Principal Problem:   Acute chest pain - unclear etiology at this time but worrisome for cardiac etiology given CAD, HTN - troponin x 2 negative, ST/T wave abnormality in inferior leads noted but apparently not much changed compared to last EKG - gastric etiology considered but unsure that PUD for example could explains elevated d - dimers - cardiology consulted for further recommendations - continue analgesia as needed - stop Ibuprofen  Active Problems:   Type II or unspecified type diabetes mellitus with neurological manifestations, not stated as uncontrolled - A1C is 6.7 - diabetic educator consult requested   HYPERLIPIDEMIA - check lipid panel as pt may benefit from statin    HYPERTENSION - continue Lisinopril and Metoprolol   PEPTIC ULCER DISEASE - continue PPI but change to BID   Consultants:  None  Procedures/Studies: Dg Chest 2 View   04/23/2013 No active cardiopulmonary disease.   Electronically Signed   By: [name] M.D.   On: 04/23/2013 18:10  Ct Angio Chest Pe  W/cm &/or Wo Cm   04/23/2013  Coronary artery disease.  Cardiomegaly. No evidence of pulmonary emboli.  No acute abnormality.     Antibiotics:  None  Code Status: Full Family Communication: Pt at bedside Disposition Plan: Home when medically stable  HPI/Subjective: No events overnight.   Objective: Filed Vitals:   04/24/13 0439 04/24/13 1040 04/24/13 1107 04/24/13 1353  BP: 96/63 118/71 107/67 115/67  Pulse: 71 75 64 65  Temp: 98.6 F (37 C) 99.3 F (37.4 C)  99.8 F (37.7 C)  TempSrc: Oral Oral  Oral  Resp: 18 18  20   Height:      Weight:      SpO2: 95% 95%  97%    Intake/Output Summary (Last 24 hours) at 04/24/13 1521 Last data filed at 04/24/13 1300  Gross per 24 hour  Intake    820 ml  Output    600 ml  Net    220 ml    Exam:   General:  Pt is alert, follows commands appropriately, not in acute distress  Cardiovascular: Regular rate and rhythm, S1/S2, no murmurs, no rubs, no gallops  Respiratory: Clear to auscultation bilaterally, no wheezing, no crackles, no rhonchi  Abdomen: Soft, non tender, non distended, bowel sounds present, no guarding  Extremities: No edema, pulses DP and PT palpable bilaterally  Neuro: Grossly nonfocal  Data Reviewed: Basic Metabolic Panel:  Recent Labs Lab 04/23/13 1710  NA 131*  K 4.5  CL 90*  CO2 30  GLUCOSE 138*  BUN 10  CREATININE 0.83  CALCIUM 9.7   Liver Function  Tests:  Recent Labs Lab 04/23/13 1710  AST 51*  ALT 19  ALKPHOS 103  BILITOT 0.5  PROT 6.8  ALBUMIN 4.0    Recent Labs Lab 04/23/13 2356  LIPASE 34   CBC:  Recent Labs Lab 04/23/13 1710  WBC 9.3  NEUTROABS 6.0  HGB 15.3  HCT 42.1  MCV 95.7  PLT 205   Cardiac Enzymes:  Recent Labs Lab 04/23/13 1710 04/24/13 0844 04/24/13 1336  TROPONINI 0.01 <0.30 <0.30   CBG:  Recent Labs Lab 04/24/13 0152 04/24/13 0622  GLUCAP 79 90   Scheduled Meds: . aspirin EC  325 mg Oral Daily  . lisinopril  40 mg Oral Daily  .  metoprolol succinate  100 mg Oral Q breakfast  . pantoprazole  40 mg Oral Daily  . spironolactone  50 mg Oral Daily   Continuous Infusions:  Faye Ramsay, MD  [facility] Pager [phone]  If 7PM-7AM, please contact night-coverage [email or web address] Password [email or web address] 04/24/2013, 3:21 PM   LOS: 1 day

## 2013-04-24 NOTE — Progress Notes (Signed)
Echocardiogram 2D Echocardiogram has been performed.  [name] [name] 04/24/2013, 10:23 AM

## 2013-04-25 ENCOUNTER — Encounter: Payer: Self-pay | Admitting: Internal Medicine

## 2013-04-25 ENCOUNTER — Observation Stay (HOSPITAL_COMMUNITY): Payer: No Typology Code available for payment source

## 2013-04-25 ENCOUNTER — Ambulatory Visit (HOSPITAL_COMMUNITY): Payer: No Typology Code available for payment source

## 2013-04-25 LAB — CBC
HEMATOCRIT: 41.4 % (ref 39.0–52.0)
HEMOGLOBIN: 14.7 g/dL (ref 13.0–17.0)
MCH: 34.9 pg — AB (ref 26.0–34.0)
MCHC: 35.5 g/dL (ref 30.0–36.0)
MCV: 98.3 fL (ref 78.0–100.0)
Platelets: 165 10*3/uL (ref 150–400)
RBC: 4.21 MIL/uL — ABNORMAL LOW (ref 4.22–5.81)
RDW: 12.2 % (ref 11.5–15.5)
WBC: 9.1 10*3/uL (ref 4.0–10.5)

## 2013-04-25 LAB — HEMOGLOBIN A1C
HEMOGLOBIN A1C: 6.1 % — AB (ref <5.7)
Mean Plasma Glucose: 128 mg/dL — ABNORMAL HIGH (ref <117)

## 2013-04-25 LAB — COMPREHENSIVE METABOLIC PANEL
ALK PHOS: 120 U/L — AB (ref 39–117)
ALT: 24 U/L (ref 0–53)
AST: 59 U/L — ABNORMAL HIGH (ref 0–37)
Albumin: 3 g/dL — ABNORMAL LOW (ref 3.5–5.2)
BUN: 15 mg/dL (ref 6–23)
CO2: 28 mEq/L (ref 19–32)
CREATININE: 0.85 mg/dL (ref 0.50–1.35)
Calcium: 9.9 mg/dL (ref 8.4–10.5)
Chloride: 87 mEq/L — ABNORMAL LOW (ref 96–112)
GFR calc Af Amer: >90 mL/min (ref >90)
GFR calc non Af Amer: >90 mL/min (ref >90)
GLUCOSE: 124 mg/dL — AB (ref 70–99)
POTASSIUM: 4.2 meq/L (ref 3.7–5.3)
Sodium: 130 mEq/L — ABNORMAL LOW (ref 137–147)
Total Bilirubin: 0.3 mg/dL (ref 0.3–1.2)
Total Protein: 6.9 g/dL (ref 6.0–8.3)

## 2013-04-25 LAB — LIPID PANEL
CHOLESTEROL: 89 mg/dL (ref 0–200)
HDL: 20 mg/dL — ABNORMAL LOW (ref >39)
LDL CALC: 38 mg/dL (ref 0–99)
TRIGLYCERIDES: 156 mg/dL — AB (ref <150)
Total CHOL/HDL Ratio: 4.5 RATIO
VLDL: 31 mg/dL (ref 0–40)

## 2013-04-25 MED ORDER — TECHNETIUM TC 99M SESTAMIBI GENERIC - CARDIOLITE
30.0000 | Freq: Once | INTRAVENOUS | Status: AC | PRN
  Administered 2013-04-25: 30 via INTRAVENOUS

## 2013-04-25 MED ORDER — REGADENOSON 0.4 MG/5ML IV SOLN
0.4000 mg | Freq: Once | INTRAVENOUS | Status: DC
  Filled 2013-04-25: qty 5

## 2013-04-25 MED ORDER — REGADENOSON 0.4 MG/5ML IV SOLN
0.4000 mg | Freq: Once | INTRAVENOUS | Status: AC
  Administered 2013-04-25: 0.4 mg via INTRAVENOUS
  Filled 2013-04-25: qty 5

## 2013-04-25 MED ORDER — REGADENOSON 0.4 MG/5ML IV SOLN
INTRAVENOUS | Status: AC
  Filled 2013-04-25: qty 5

## 2013-04-25 MED ORDER — OXYCODONE-ACETAMINOPHEN 5-325 MG PO TABS: 1.0000 | ORAL_TABLET | ORAL | Status: DC | PRN

## 2013-04-25 MED ORDER — TECHNETIUM TC 99M SESTAMIBI GENERIC - CARDIOLITE
10.0000 | Freq: Once | INTRAVENOUS | Status: AC | PRN
  Administered 2013-04-25: 10 via INTRAVENOUS

## 2013-04-25 NOTE — Progress Notes (Signed)
UR completed 

## 2013-04-25 NOTE — Discharge Instructions (Signed)
Chest Pain (Nonspecific) °It is often hard to give a specific diagnosis for the cause of chest pain. There is always a chance that your pain could be related to something serious, such as a heart attack or a blood clot in the lungs. You need to follow up with your caregiver for further evaluation. °CAUSES  °· Heartburn. °· Pneumonia or bronchitis. °· Anxiety or stress. °· Inflammation around your heart (pericarditis) or lung (pleuritis or pleurisy). °· A blood clot in the lung. °· A collapsed lung (pneumothorax). It can develop suddenly on its own (spontaneous pneumothorax) or from injury (trauma) to the chest. °· Shingles infection (herpes zoster virus). °The chest wall is composed of bones, muscles, and cartilage. Any of these can be the source of the pain. °· The bones can be bruised by injury. °· The muscles or cartilage can be strained by coughing or overwork. °· The cartilage can be affected by inflammation and become sore (costochondritis). °DIAGNOSIS  °Lab tests or other studies, such as X-rays, electrocardiography, stress testing, or cardiac imaging, may be needed to find the cause of your pain.  °TREATMENT  °· Treatment depends on what may be causing your chest pain. Treatment may include: °· Acid blockers for heartburn. °· Anti-inflammatory medicine. °· Pain medicine for inflammatory conditions. °· Antibiotics if an infection is present. °· You may be advised to change lifestyle habits. This includes stopping smoking and avoiding alcohol, caffeine, and chocolate. °· You may be advised to keep your head raised (elevated) when sleeping. This reduces the chance of acid going backward from your stomach into your esophagus. °· Most of the time, nonspecific chest pain will improve within 2 to 3 days with rest and mild pain medicine. °HOME CARE INSTRUCTIONS  °· If antibiotics were prescribed, take your antibiotics as directed. Finish them even if you start to feel better. °· For the next few days, avoid physical  activities that bring on chest pain. Continue physical activities as directed. °· Do not smoke. °· Avoid drinking alcohol. °· Only take over-the-counter or prescription medicine for pain, discomfort, or fever as directed by your caregiver. °· Follow your caregiver's suggestions for further testing if your chest pain does not go away. °· Keep any follow-up appointments you made. If you do not go to an appointment, you could develop lasting (chronic) problems with pain. If there is any problem keeping an appointment, you must call to reschedule. °SEEK MEDICAL CARE IF:  °· You think you are having problems from the medicine you are taking. Read your medicine instructions carefully. °· Your chest pain does not go away, even after treatment. °· You develop a rash with blisters on your chest. °SEEK IMMEDIATE MEDICAL CARE IF:  °· You have increased chest pain or pain that spreads to your arm, neck, jaw, back, or abdomen. °· You develop shortness of breath, an increasing cough, or you are coughing up blood. °· You have severe back or abdominal pain, feel nauseous, or vomit. °· You develop severe weakness, fainting, or chills. °· You have a fever. °THIS IS AN EMERGENCY. Do not wait to see if the pain will go away. Get medical help at once. Call your local emergency services (911 in [place] Do not drive yourself to the hospital. °MAKE SURE YOU:  °· Understand these instructions. °· Will watch your condition. °· Will get help right away if you are not doing well or get worse. °Document Released: 11/03/2004 Document Revised: 04/18/2011 Document Reviewed: 08/30/2007 °[employer]® Patient Information ©2014 [employer]   [employer]. ° °

## 2013-04-25 NOTE — Discharge Summary (Signed)
Physician Discharge Summary  [name] [name] EPP:[id number] DOB: 1955/08/15 DOA: 04/23/2013  PCP: [name], MD  Admit date: 04/23/2013 Discharge date: 04/25/2013  Recommendations for Outpatient Follow-up:  1. Pt will need to follow up with PCP in 2-3 weeks post discharge 2. Please obtain BMP to evaluate electrolytes and kidney function 3. Please also check CBC to evaluate Hg and Hct levels  Discharge Diagnoses: Chest pain  Principal Problem:   Acute chest pain Active Problems:   Type II or unspecified type diabetes mellitus with neurological manifestations, not stated as uncontrolled(250.60)   HYPERLIPIDEMIA   HYPERTENSION   PEPTIC ULCER DISEASE   Chest pain  Discharge Condition: Stable  Diet recommendation: Heart healthy diet discussed in details   Brief narrative:  Pt is 58 yo male with history of HTN, acid reflux and PUD, presented to [facility] with main concern of progressively worsening substernal chest pain,, relatively persistent in nature and radiating to bilateral shoulder bladed, occasionally but not consistently radiating to bilateral groin area, no specific alleviating or aggravating factors, 7/10 in severity at worst and 5/10 in severity at best. Pt explains he typically takes PPI for acid reflux and it helps with symptoms but this time PPI's are not sufficient.   In ED, pt noted to be hemodynamically stable, d-dimer elevated at 5.5 and CT angio chest with CAD, cardiomegaly, no PE. TR[facility] asked to admit for further evaluation. First set of troponin negative.   Principal Problem:  Acute chest pain  - now resolved, possibly related to acid reflux, stress test negative  - pt feels better this AM, pain medication help  - troponin x 3 negative, ST/T wave abnormality in inferior leads noted but apparently not much changed compared to last EKG  - no additional cardiologic work up indicated, we appreciate cardiology input  Active Problems:  Type II or unspecified type diabetes  mellitus with neurological manifestations, not stated as uncontrolled  - A1C is 6.7  - diabetic educator consult requested, recommendations appreciated  HYPERLIPIDEMIA  - lipid panel stable  HYPERTENSION  - continue Lisinopril and Metoprolol  PEPTIC ULCER DISEASE  - continue PPI   Consultants:  Cardiology  Procedures/Studies:  Dg Chest 2 View 04/23/2013 No active cardiopulmonary disease. Electronically Signed By: [name] M.D. On: 04/23/2013 18:10  Ct Angio Chest Pe W/cm &/or Wo Cm 04/23/2013 Coronary artery disease. Cardiomegaly. No evidence of pulmonary emboli. No acute abnormality.  Antibiotics:  None  Code Status: Full  Family Communication: Pt at bedside    Discharge Exam: Filed Vitals:   04/25/13 1402  BP: 125/70  Pulse: 70  Temp: 97.8 F (36.6 C)  Resp: 19   Filed Vitals:   04/25/13 1215 04/25/13 1216 04/25/13 1219 04/25/13 1402  BP: 125/78 120/78 110/78 125/70  Pulse: 76 79 82 70  Temp:    97.8 F (36.6 C)  TempSrc:    Oral  Resp:    19  Height:      Weight:      SpO2:    93%    General: Pt is alert, follows commands appropriately, not in acute distress Cardiovascular: Regular rate and rhythm, S1/S2 +, no murmurs, no rubs, no gallops Respiratory: Clear to auscultation bilaterally, no wheezing, no crackles, no rhonchi Abdominal: Soft, non tender, non distended, bowel sounds +, no guarding Extremities: no edema, no cyanosis, pulses palpable bilaterally DP and PT Neuro: Grossly nonfocal  Discharge Instructions  Discharge Orders   Future Appointments Provider Department Dept Phone   06/17/2013 9:30 AM Ulice Dash  Everitt Amber, MD [facility] [phone]   Future Orders Complete By Expires   Diet - low sodium heart healthy  As directed    Increase activity slowly  As directed        Medication List    STOP taking these medications       HYDROcodone-acetaminophen 5-325 MG per tablet  Commonly known as:  NORCO/VICODIN     ibuprofen  200 MG tablet  Commonly known as:  ADVIL,MOTRIN      TAKE these medications       acetaminophen 500 MG tablet  Commonly known as:  TYLENOL  Take 1,000 mg by mouth every 6 (six) hours as needed for moderate pain.     lisinopril 40 MG tablet  Commonly known as:  PRINIVIL,ZESTRIL  Take 1 tablet (40 mg total) by mouth daily. Please reschedule your 6 month follow up appointment for additional refills.     metoprolol succinate 100 MG 24 hr tablet  Commonly known as:  TOPROL-XL  Take 100 mg by mouth daily. Take with or immediately following a meal.     oxyCODONE-acetaminophen 5-325 MG per tablet  Commonly known as:  PERCOCET/ROXICET  Take 1-2 tablets by mouth every 3 (three) hours as needed for severe pain.     pantoprazole 40 MG tablet  Commonly known as:  PROTONIX  Take 1 tablet (40 mg total) by mouth daily.     spironolactone 50 MG tablet  Commonly known as:  ALDACTONE  Take 50 mg by mouth daily.           Follow-up Information   Schedule an appointment as soon as possible for a visit with [name], MD.   Specialty:  Family Medicine   Contact information:   [address] [place] [address] [phone]        The results of significant diagnostics from this hospitalization (including imaging, microbiology, ancillary and laboratory) are listed below for reference.     Microbiology: No results found for this or any previous visit (from the past 240 hour(s)).   Labs: Basic Metabolic Panel:  Recent Labs Lab 04/23/13 1710 04/25/13 0535  NA 131* 130*  K 4.5 4.2  CL 90* 87*  CO2 30 28  GLUCOSE 138* 124*  BUN 10 15  CREATININE 0.83 0.85  CALCIUM 9.7 9.9   Liver Function Tests:  Recent Labs Lab 04/23/13 1710 04/25/13 0535  AST 51* 59*  ALT 19 24  ALKPHOS 103 120*  BILITOT 0.5 0.3  PROT 6.8 6.9  ALBUMIN 4.0 3.0*    Recent Labs Lab 04/23/13 2356  LIPASE 34   No results found for this basename: AMMONIA,  in  the last 168 hours CBC:  Recent Labs Lab 04/23/13 1710 04/25/13 0535  WBC 9.3 9.1  NEUTROABS 6.0  --   HGB 15.3 14.7  HCT 42.1 41.4  MCV 95.7 98.3  PLT 205 165   Cardiac Enzymes:  Recent Labs Lab 04/23/13 1710 04/24/13 0844 04/24/13 1336 04/24/13 2004  TROPONINI 0.01 <0.30 <0.30 <0.30   BNP: BNP (last 3 results) No results found for this basename: PROBNP,  in the last 8760 hours CBG:  Recent Labs Lab 04/24/13 0152 04/24/13 0622  GLUCAP 79 90     SIGNED: Time coordinating discharge: Over 30 minutes  Faye Ramsay, MD  [facility] 04/25/2013, 5:05 PM Pager [phone]  If 7PM-7AM, please contact night-coverage [email or web address] Password [email or web address]

## 2013-04-25 NOTE — Progress Notes (Signed)
Received consult for diabetes coordinator for "diabetes, mild".  Noted patient has a history of diabetes and is followed by his PCP for diabetes management.  Patient reports that he has his A1C checked at least twice a year and it has gotten better over the past year but he is not sure of what the actual number was.  In talking with patient, he manages his diabetes with diet and weight loss.  He states that he has lost about 22 pounds over the past month and he has more weight to lose.  He states that he came to the hospital with chest pain but thankfully everything was checking out okay with his heart so far.  He reports this "heart attack scare" has put a lot of things in perspective for him and he was going to get even more serious about the right things to eat and keep his diabetes controlled. Patient verbalized understanding of information discussed and reports that he has no further questions related to his diabetes at this time.

## 2013-04-25 NOTE — Progress Notes (Signed)
Patient ID: [name] [name], male   DOB: [birth year], 58 y.o.   MRN: [id number]  [facility] PROGRESS NOTE  [name] [name] JME:[id number] DOB: 10-Aug-1955 DOA: 04/23/2013 PCP: [name] Lofts, MD  Brief narrative:  Pt is 58 yo male with history of HTN, acid reflux and PUD, presented to [facility] with main concern of progressively worsening substernal chest pain,, relatively persistent in nature and radiating to bilateral shoulder bladed, occasionally but not consistently radiating to bilateral groin area, no specific alleviating or aggravating factors, 7/10 in severity at worst and 5/10 in severity at best. Pt explains he typically takes PPI for acid reflux and it helps with symptoms but this time PPI's are not sufficient.   In ED, pt noted to be hemodynamically stable, d-dimer elevated at 5.5 and CT angio chest with CAD, cardiomegaly, no PE. TRH asked to admit for further evaluation. First set of troponin negative.   Principal Problem:  Acute chest pain  - unclear etiology at this time but worrisome for cardiac etiology given CAD, HTN  - pt feels better this AM, pain medication help - troponin x 3 negative, ST/T wave abnormality in inferior leads noted but apparently not much changed compared to last EKG  - plan for stress test this AM, follow up on results, possible dc home if stress test negative  Active Problems:  Type II or unspecified type diabetes mellitus with neurological manifestations, not stated as uncontrolled  - A1C is 6.7  - diabetic educator consult requested, recommendations appreciated   HYPERLIPIDEMIA  - check lipid panel as pt may benefit from statin  HYPERTENSION  - continue Lisinopril and Metoprolol  PEPTIC ULCER DISEASE  - continue PPI but change to BID   Consultants:  None Procedures/Studies:  Dg Chest 2 View 04/23/2013 No active cardiopulmonary disease. Electronically Signed By: [name] M.D. On: 04/23/2013 18:10  Ct Angio Chest Pe W/cm &/or Wo Cm 04/23/2013 Coronary  artery disease. Cardiomegaly. No evidence of pulmonary emboli. No acute abnormality.  Antibiotics:  None  Code Status: Full  Family Communication: Pt at bedside  Disposition Plan: Home possibly today if stress test negative   HPI/Subjective: No events overnight.   Objective: Filed Vitals:   04/25/13 1215 04/25/13 1216 04/25/13 1219 04/25/13 1402  BP: 125/78 120/78 110/78 125/70  Pulse: 76 79 82 70  Temp:    97.8 F (36.6 C)  TempSrc:    Oral  Resp:    19  Height:      Weight:      SpO2:    93%    Intake/Output Summary (Last 24 hours) at 04/25/13 1620 Last data filed at 04/25/13 2297  Gross per 24 hour  Intake    720 ml  Output    900 ml  Net   -180 ml    Exam:   General:  Pt is alert, follows commands appropriately, not in acute distress  Cardiovascular: Regular rate and rhythm, S1/S2, no murmurs, no rubs, no gallops  Respiratory: Clear to auscultation bilaterally, no wheezing, no crackles, no rhonchi  Abdomen: Soft, non tender, non distended, bowel sounds present, no guarding  Extremities: No edema, pulses DP and PT palpable bilaterally  Neuro: Grossly nonfocal  Data Reviewed: Basic Metabolic Panel:  Recent Labs Lab 04/23/13 1710 04/25/13 0535  NA 131* 130*  K 4.5 4.2  CL 90* 87*  CO2 30 28  GLUCOSE 138* 124*  BUN 10 15  CREATININE 0.83 0.85  CALCIUM 9.7 9.9   Liver Function Tests:  Recent Labs  Lab 04/23/13 1710 04/25/13 0535  AST 51* 59*  ALT 19 24  ALKPHOS 103 120*  BILITOT 0.5 0.3  PROT 6.8 6.9  ALBUMIN 4.0 3.0*    Recent Labs Lab 04/23/13 2356  LIPASE 34   No results found for this basename: AMMONIA,  in the last 168 hours CBC:  Recent Labs Lab 04/23/13 1710 04/25/13 0535  WBC 9.3 9.1  NEUTROABS 6.0  --   HGB 15.3 14.7  HCT 42.1 41.4  MCV 95.7 98.3  PLT 205 165   Cardiac Enzymes:  Recent Labs Lab 04/23/13 1710 04/24/13 0844 04/24/13 1336 04/24/13 2004  TROPONINI 0.01 <0.30 <0.30 <0.30   BNP: No components  found with this basename: POCBNP,  CBG:  Recent Labs Lab 04/24/13 0152 04/24/13 0622  GLUCAP 79 90    No results found for this or any previous visit (from the past 240 hour(s)).   Scheduled Meds: . aspirin EC  325 mg Oral Daily  . lisinopril  40 mg Oral Daily  . metoprolol succinate  100 mg Oral Q breakfast  . pantoprazole  40 mg Oral BID AC  . regadenoson      . regadenoson  0.4 mg Intravenous Once  . spironolactone  50 mg Oral Daily   Continuous Infusions:    Faye Ramsay, MD  [facility] Pager [phone]  If 7PM-7AM, please contact night-coverage [email or web address] Password [email or web address] 04/25/2013, 4:20 PM   LOS: 2 days

## 2013-04-25 NOTE — Progress Notes (Signed)
Subjective:  Patient seen in nuclear medicine department.  Continues to have a localize chest pain without associated symptoms  Objective:  Vital Signs in the last 24 hours: Temp:  [97.8 F (36.6 C)-99.7 F (37.6 C)] 97.8 F (36.6 C) (03/19 1402) Pulse Rate:  [70-85] 70 (03/19 1402) Resp:  [16-19] 19 (03/19 1402) BP: (104-132)/(62-81) 125/70 mmHg (03/19 1402) SpO2:  [93 %-95 %] 93 % (03/19 1402)  Intake/Output from previous day: 03/18 0701 - 03/19 0700 In: 1560 [P.O.:1560] Out: 1200 [Urine:1200] Intake/Output from this shift:    Physical Exam: Neck: no adenopathy, no carotid bruit, no JVD and supple, symmetrical, trachea midline Lungs: clear to auscultation bilaterally Heart: regular rate and rhythm, S1, S2 normal, no murmur, click, rub or gallop Abdomen: soft, non-tender; bowel sounds normal; no masses,  no organomegaly Extremities: extremities normal, atraumatic, no cyanosis or edema  Lab Results:  Recent Labs  04/23/13 1710 04/25/13 0535  WBC 9.3 9.1  HGB 15.3 14.7  PLT 205 165    Recent Labs  04/23/13 1710 04/25/13 0535  NA 131* 130*  K 4.5 4.2  CL 90* 87*  CO2 30 28  GLUCOSE 138* 124*  BUN 10 15  CREATININE 0.83 0.85    Recent Labs  04/24/13 1336 04/24/13 2004  TROPONINI <0.30 <0.30   Hepatic Function Panel  Recent Labs  04/25/13 0535  PROT 6.9  ALBUMIN 3.0*  AST 59*  ALT 24  ALKPHOS 120*  BILITOT 0.3    Recent Labs  04/25/13 0535  CHOL 89   No results found for this basename: PROTIME,  in the last 72 hours  Imaging: Imaging results have been reviewed and Dg Chest 2 View  04/23/2013   CLINICAL DATA:  Chest pain  EXAM: CHEST  2 VIEW  COMPARISON:  None  FINDINGS: The heart size and mediastinal contours are within normal limits. Both lungs are clear. The visualized skeletal structures are unremarkable. 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  IMPRESSION: No active cardiopulmonary disease.   Electronically Signed    By: [name] M.D.   On: 04/23/2013 18:10   Ct Angio Chest Pe W/cm &/or Wo Cm  04/23/2013   CLINICAL DATA:  Elevated D-dimer.  Chest pain.  EXAM: CT ANGIOGRAPHY CHEST WITH CONTRAST  TECHNIQUE: Multidetector CT imaging of the chest was performed using the standard protocol during bolus administration of intravenous contrast. Multiplanar CT image reconstructions and MIPs were obtained to evaluate the vascular anatomy.  CONTRAST:  156mL OMNIPAQUE IOHEXOL 350 MG/ML SOLN  COMPARISON:  DG CHEST 2 VIEW dated 04/23/2013  FINDINGS: Slight motion artifact in the mediastinum. Thoracic aorta normal in caliber. Atherosclerotic vascular changes are noted of the thoracic aorta. No aneurysm or dissection. Great vessels patent. Pulmonary arteries are intact. No pulmonary embolus. Cardiomegaly. Coronary artery disease. No pericardial effusion.  No significant mediastinal or hilar adenopathy. Thoracic esophagus unremarkable.  Large airways are patent. Lungs are clear of acute infiltrates. No pleural effusion or pneumothorax.  Visualized upper abdominal structures are unremarkable.  Thyroid is unremarkable. No supraclavicular or axillary lymph nodes are noted. Chest wall is intact. Degenerative changes thoracic spine. No acute abnormality.  Review of the MIP images confirms the above findings.  IMPRESSION: 1. Coronary artery disease.  Cardiomegaly. 2. No evidence of pulmonary emboli.  No acute abnormality.   Electronically Signed   By: Marcello Moores  Register   On: 04/23/2013 22:51    Cardiac Studies:  Assessment/Plan:  Recurrent chest pain MI ruled out  rule out coronary insufficiency  Hypertension  Type 2 diabetes mellitus  GERD  History of bleeding ulcers in the past  EtOH abuse  Morbid obesity  Positive family history of coronary artery disease  Plan Continue present management Check nuclear stress test results if negative for ischemia may be discharged from cardiac point of view  LOS: 2 days    [name] [name]  N 04/25/2013, 4:19 PM

## 2013-04-25 NOTE — Progress Notes (Addendum)
Inpatient Diabetes Program Recommendations  AACE/ADA: New Consensus Statement on Inpatient Glycemic Control (2013)  Target Ranges:  Prepandial:   less than 140 mg/dL      Peak postprandial:   less than 180 mg/dL (1-2 hours)      Critically ill patients:  140 - 180 mg/dL  Results for ASTOR, GENTLE (MRN [id number]) as of 04/25/2013 11:30  Ref. Range 04/20/2012 08:06 04/23/2013 17:10 04/25/2013 05:35  Hemoglobin A1C Latest Range: 4.6-6.5 % 6.7 (H)    Glucose Latest Range: 70-99 mg/dL  138 (H) 124 (H)   Diabetes history: DM2 (A1C on 01/09/12 was 7.2% and was 6.7% on 04/20/12) Outpatient Diabetes medications: None Current orders for Inpatient glycemic control: None  Inpatient Diabetes Program Recommendations Correction (SSI): While inpatient, please order CBGs with Novolog sensitive correction scale ACHS. A1C: Please order an A1C to evaluate glycemic control over the past 2-3 months. Last A1C in the chart was 6.7% on 04/20/12 (over a year ago).  Note:  Noted consult for diabetes coordinator (for mild diabetes). Patient has a history of diabetes which has been controlled with lifestyle modifications.  While inpatient, please order CBGs with Novolog correction scale ACHS and order an updated A1C. Will continue to follow.  Thanks, Barnie Alderman, RN, MSN, CCRN Diabetes Coordinator Inpatient Diabetes Program [phone] (Team Pager) [phone] ([facility] office) [phone] [facility] office)

## 2013-05-01 ENCOUNTER — Ambulatory Visit (INDEPENDENT_AMBULATORY_CARE_PROVIDER_SITE_OTHER): Payer: No Typology Code available for payment source | Admitting: Family Medicine

## 2013-05-01 ENCOUNTER — Telehealth: Payer: Self-pay | Admitting: *Deleted

## 2013-05-01 ENCOUNTER — Encounter: Payer: Self-pay | Admitting: Family Medicine

## 2013-05-01 VITALS — BP 90/60 | HR 92 | Temp 98.8°F | Ht 64.0 in | Wt 189.2 lb

## 2013-05-01 DIAGNOSIS — R079 Chest pain, unspecified: Secondary | ICD-10-CM

## 2013-05-01 DIAGNOSIS — E1149 Type 2 diabetes mellitus with other diabetic neurological complication: Secondary | ICD-10-CM

## 2013-05-01 DIAGNOSIS — I10 Essential (primary) hypertension: Secondary | ICD-10-CM

## 2013-05-01 LAB — COMPREHENSIVE METABOLIC PANEL
ALBUMIN: 3.4 g/dL — AB (ref 3.5–5.2)
ALT: 40 U/L (ref 0–53)
AST: 123 U/L — AB (ref 0–37)
Alkaline Phosphatase: 394 U/L — ABNORMAL HIGH (ref 39–117)
BUN: 27 mg/dL — ABNORMAL HIGH (ref 6–23)
CALCIUM: 10.9 mg/dL — AB (ref 8.4–10.5)
CHLORIDE: 82 meq/L — AB (ref 96–112)
CO2: 30 mEq/L (ref 19–32)
Creatinine, Ser: 1.3 mg/dL (ref 0.4–1.5)
GFR: 63.21 mL/min (ref >60.00)
Glucose, Bld: 112 mg/dL — ABNORMAL HIGH (ref 70–99)
POTASSIUM: 5 meq/L (ref 3.5–5.1)
Sodium: 122 mEq/L — ABNORMAL LOW (ref 135–145)
Total Bilirubin: 1 mg/dL (ref 0.3–1.2)
Total Protein: 7.5 g/dL (ref 6.0–8.3)

## 2013-05-01 LAB — CBC WITH DIFFERENTIAL/PLATELET
BASOS ABS: 0 10*3/uL (ref 0.0–0.1)
Basophils Relative: 0.3 % (ref 0.0–3.0)
Eosinophils Absolute: 0 10*3/uL (ref 0.0–0.7)
Eosinophils Relative: 0.4 % (ref 0.0–5.0)
HCT: 46.4 % (ref 39.0–52.0)
Hemoglobin: 15.7 g/dL (ref 13.0–17.0)
LYMPHS PCT: 20.5 % (ref 12.0–46.0)
Lymphs Abs: 1.6 10*3/uL (ref 0.7–4.0)
MCHC: 33.8 g/dL (ref 30.0–36.0)
MCV: 100.3 fl — ABNORMAL HIGH (ref 78.0–100.0)
Monocytes Absolute: 0.7 10*3/uL (ref 0.1–1.0)
Monocytes Relative: 8.6 % (ref 3.0–12.0)
Neutro Abs: 5.4 10*3/uL (ref 1.4–7.7)
Neutrophils Relative %: 70.2 % (ref 43.0–77.0)
Platelets: 172 10*3/uL (ref 150.0–400.0)
RBC: 4.63 Mil/uL (ref 4.22–5.81)
RDW: 13 % (ref 11.5–14.6)
WBC: 7.6 10*3/uL (ref 4.5–10.5)

## 2013-05-01 MED ORDER — ESOMEPRAZOLE MAGNESIUM 40 MG PO PACK
40.0000 mg | PACK | Freq: Every day | ORAL | Status: AC
Start: 2013-05-01 — End: ?

## 2013-05-01 NOTE — Patient Instructions (Addendum)
Stop protonix. Start nexium 40 mg daily. Add zantac or pepcid AC twice daily as needed for chest pain. For breakthrough pain that is severe can take percocet. Stop at front desk to discuss the GI referral.

## 2013-05-01 NOTE — Progress Notes (Signed)
Subjective:    Patient ID: [name] [name], male    DOB: [birth year], 58 y.o.   MRN: [id number]  HPI  59 year old male presents for hospital follow up. Admit date: 04/23/2013  Discharge date: 04/25/2013    Recommendations for Outpatient Follow-up:  1. Pt will need to follow up with PCP in 2-3 weeks post discharge 2. Please obtain BMP to evaluate electrolytes and kidney function 3. Please also check CBC to evaluate Hg and Hct levels  Brief narrative:  Pt is 58 yo male with history of HTN, acid reflux and PUD, presented to [facility] with main concern of progressively worsening substernal chest pain,, relatively persistent in nature and radiating to bilateral shoulder bladed, occasionally but not consistently radiating to bilateral groin area, no specific alleviating or aggravating factors, 7/10 in severity at worst and 5/10 in severity at best. Pt explains he typically takes PPI for acid reflux and it helps with symptoms but this time PPI's are not sufficient.  In ED, pt noted to be hemodynamically stable, d-dimer elevated at 5.5 and CT angio chest with CAD, cardiomegaly, no PE. TRH asked to admit for further evaluation. First set of troponin negative.   Principal Problem:  Acute chest pain  - now resolved, possibly related to acid reflux, stress test negative  - pt feels better this AM, pain medication help  - troponin x 3 negative, ST/T wave abnormality in inferior leads noted but apparently not much changed compared to last EKG  - no additional cardiologic work up indicated, we appreciate cardiology input  Active Problems:  Type II or unspecified type diabetes mellitus with neurological manifestations, not stated as uncontrolled  - A1C is 6.7  - diabetic educator consult requested, recommendations appreciated  HYPERLIPIDEMIA  - lipid panel stable  HYPERTENSION  - continue Lisinopril and Metoprolol  PEPTIC ULCER DISEASE  - continue PPI   Today he reports he had an episode of chest pain  that was worse over the weekend. He was taking 1 percocet 3-4 times a day. Then he had improvement in pain. He has not needed any pain med today. He feels that the protonix has only helped a little. Muscles ache and swelling He thinks is due to protonix. He has no appetite.  Has referral to GI  In 06/2013.Marland Kitchen He feels he cannot wait until then.  Down from 210 in last year. Wt Readings from Last 3 Encounters:  05/01/13 189 lb 4 oz (85.843 kg)  04/24/13 192 lb 1.6 oz (87.136 kg)  04/23/13 194 lb 12 oz (88.338 kg)    Review of Systems  Constitutional: Positive for fatigue. Negative for fever.  HENT: Negative for ear pain.   Eyes: Negative for pain.  Respiratory: Negative for cough and shortness of breath.   Gastrointestinal: Negative for abdominal pain and abdominal distention.  Genitourinary: Negative for dysuria.  Musculoskeletal: Positive for arthralgias and myalgias.       Objective:   Physical Exam  Constitutional: Vital signs are normal. He appears well-developed and well-nourished.  HENT:  Head: Normocephalic.  Right Ear: Hearing normal.  Left Ear: Hearing normal.  Nose: Nose normal.  Mouth/Throat: Oropharynx is clear and moist and mucous membranes are normal.  Neck: Trachea normal. Carotid bruit is not present. No mass and no thyromegaly present.  Cardiovascular: Normal rate, regular rhythm and normal pulses.  Exam reveals no gallop, no distant heart sounds and no friction rub.   No murmur heard. No peripheral edema  Pulmonary/Chest: Effort normal and breath  sounds normal. No respiratory distress.  Skin: Skin is warm, dry and intact. No rash noted.  Psychiatric: He has a normal mood and affect. His speech is normal and behavior is normal. Thought content normal.          Assessment & Plan:

## 2013-05-01 NOTE — Progress Notes (Signed)
Pre visit review using our clinic review tool, if applicable. No additional management support is needed unless otherwise documented below in the visit note. 

## 2013-05-01 NOTE — Assessment & Plan Note (Signed)
Well controlled 

## 2013-05-01 NOTE — Addendum Note (Signed)
Addended by: Ellamae Sia on: 05/01/2013 12:14 PM   Modules accepted: Orders

## 2013-05-01 NOTE — Telephone Encounter (Signed)
Received prior authorization from [place] for Nexium 40 mg.  Prior Auth completed on CoverMyMeds.  Will await outcome.

## 2013-05-01 NOTE — Assessment & Plan Note (Signed)
Cardiac eval negative, no PE on chest CT. Most lilkely Gi source of pain.  Change to nexium as pt may be having SE to protonix. Add H2 blocker for chest pain.  Carafate did not help.  Will try to move GI referral sooner, for consideration of

## 2013-05-02 ENCOUNTER — Ambulatory Visit: Payer: No Typology Code available for payment source | Admitting: Family Medicine

## 2013-05-03 ENCOUNTER — Telehealth: Payer: Self-pay | Admitting: Family Medicine

## 2013-05-03 ENCOUNTER — Other Ambulatory Visit: Payer: Self-pay | Admitting: Family Medicine

## 2013-05-03 ENCOUNTER — Encounter: Payer: Self-pay | Admitting: Nurse Practitioner

## 2013-05-03 ENCOUNTER — Ambulatory Visit (INDEPENDENT_AMBULATORY_CARE_PROVIDER_SITE_OTHER): Payer: No Typology Code available for payment source | Admitting: Nurse Practitioner

## 2013-05-03 VITALS — BP 100/60 | HR 104 | Ht 64.0 in | Wt 188.5 lb

## 2013-05-03 DIAGNOSIS — E871 Hypo-osmolality and hyponatremia: Secondary | ICD-10-CM

## 2013-05-03 DIAGNOSIS — R748 Abnormal levels of other serum enzymes: Secondary | ICD-10-CM

## 2013-05-03 DIAGNOSIS — R1319 Other dysphagia: Secondary | ICD-10-CM

## 2013-05-03 DIAGNOSIS — R079 Chest pain, unspecified: Secondary | ICD-10-CM

## 2013-05-03 NOTE — Telephone Encounter (Signed)
Nexium approved from 05/02/2013 thru 05/02/2016.

## 2013-05-03 NOTE — Patient Instructions (Addendum)
You have been given a separate informational sheet regarding your tobacco use, the importance of quitting and local resources to help you quit. You have been scheduled for an endoscopy with propofol. Please follow written instructions given to you at your visit today. If you use inhalers (even only as needed), please bring them with you on the day of your procedure.

## 2013-05-06 ENCOUNTER — Encounter: Payer: Self-pay | Admitting: Nurse Practitioner

## 2013-05-06 ENCOUNTER — Telehealth: Payer: Self-pay | Admitting: *Deleted

## 2013-05-06 ENCOUNTER — Encounter: Payer: Self-pay | Admitting: Internal Medicine

## 2013-05-06 ENCOUNTER — Telehealth: Payer: Self-pay | Admitting: Radiology

## 2013-05-06 ENCOUNTER — Other Ambulatory Visit (INDEPENDENT_AMBULATORY_CARE_PROVIDER_SITE_OTHER): Payer: No Typology Code available for payment source

## 2013-05-06 ENCOUNTER — Inpatient Hospital Stay (HOSPITAL_COMMUNITY): Payer: No Typology Code available for payment source

## 2013-05-06 ENCOUNTER — Ambulatory Visit: Payer: No Typology Code available for payment source | Admitting: Internal Medicine

## 2013-05-06 ENCOUNTER — Encounter (HOSPITAL_COMMUNITY): Payer: Self-pay | Admitting: Internal Medicine

## 2013-05-06 ENCOUNTER — Inpatient Hospital Stay (HOSPITAL_COMMUNITY)
Admission: AD | Admit: 2013-05-06 | Discharge: 2013-05-22 | DRG: 824 | Disposition: A | Discharge status: Home / Self Care | Payer: No Typology Code available for payment source | Attending: Oncology | Admitting: Oncology

## 2013-05-06 VITALS — BP 136/94 | HR 96 | Temp 98.2°F | Ht 64.0 in | Wt 188.0 lb

## 2013-05-06 DIAGNOSIS — E861 Hypovolemia: Secondary | ICD-10-CM | POA: Diagnosis present

## 2013-05-06 DIAGNOSIS — E538 Deficiency of other specified B group vitamins: Secondary | ICD-10-CM

## 2013-05-06 DIAGNOSIS — I10 Essential (primary) hypertension: Secondary | ICD-10-CM | POA: Diagnosis present

## 2013-05-06 DIAGNOSIS — D696 Thrombocytopenia, unspecified: Secondary | ICD-10-CM | POA: Diagnosis present

## 2013-05-06 DIAGNOSIS — R634 Abnormal weight loss: Secondary | ICD-10-CM | POA: Diagnosis present

## 2013-05-06 DIAGNOSIS — R5381 Other malaise: Secondary | ICD-10-CM | POA: Diagnosis present

## 2013-05-06 DIAGNOSIS — R001 Bradycardia, unspecified: Secondary | ICD-10-CM

## 2013-05-06 DIAGNOSIS — K219 Gastro-esophageal reflux disease without esophagitis: Secondary | ICD-10-CM | POA: Diagnosis present

## 2013-05-06 DIAGNOSIS — E1140 Type 2 diabetes mellitus with diabetic neuropathy, unspecified: Secondary | ICD-10-CM | POA: Diagnosis present

## 2013-05-06 DIAGNOSIS — R740 Nonspecific elevation of levels of transaminase and lactic acid dehydrogenase [LDH]: Secondary | ICD-10-CM

## 2013-05-06 DIAGNOSIS — C8589 Other specified types of non-Hodgkin lymphoma, extranodal and solid organ sites: Principal | ICD-10-CM | POA: Diagnosis present

## 2013-05-06 DIAGNOSIS — Z833 Family history of diabetes mellitus: Secondary | ICD-10-CM

## 2013-05-06 DIAGNOSIS — K2990 Gastroduodenitis, unspecified, without bleeding: Secondary | ICD-10-CM

## 2013-05-06 DIAGNOSIS — F1010 Alcohol abuse, uncomplicated: Secondary | ICD-10-CM | POA: Diagnosis present

## 2013-05-06 DIAGNOSIS — Z8711 Personal history of peptic ulcer disease: Secondary | ICD-10-CM

## 2013-05-06 DIAGNOSIS — H532 Diplopia: Secondary | ICD-10-CM | POA: Diagnosis present

## 2013-05-06 DIAGNOSIS — R7989 Other specified abnormal findings of blood chemistry: Secondary | ICD-10-CM | POA: Diagnosis present

## 2013-05-06 DIAGNOSIS — S68118A Complete traumatic metacarpophalangeal amputation of other finger, initial encounter: Secondary | ICD-10-CM

## 2013-05-06 DIAGNOSIS — R509 Fever, unspecified: Secondary | ICD-10-CM | POA: Diagnosis present

## 2013-05-06 DIAGNOSIS — E876 Hypokalemia: Secondary | ICD-10-CM | POA: Diagnosis present

## 2013-05-06 DIAGNOSIS — R079 Chest pain, unspecified: Secondary | ICD-10-CM | POA: Insufficient documentation

## 2013-05-06 DIAGNOSIS — R599 Enlarged lymph nodes, unspecified: Secondary | ICD-10-CM | POA: Diagnosis present

## 2013-05-06 DIAGNOSIS — K279 Peptic ulcer, site unspecified, unspecified as acute or chronic, without hemorrhage or perforation: Secondary | ICD-10-CM | POA: Diagnosis present

## 2013-05-06 DIAGNOSIS — R1310 Dysphagia, unspecified: Secondary | ICD-10-CM | POA: Diagnosis present

## 2013-05-06 DIAGNOSIS — R7402 Elevation of levels of lactic acid dehydrogenase (LDH): Secondary | ICD-10-CM | POA: Diagnosis present

## 2013-05-06 DIAGNOSIS — Z8349 Family history of other endocrine, nutritional and metabolic diseases: Secondary | ICD-10-CM

## 2013-05-06 DIAGNOSIS — R1319 Other dysphagia: Secondary | ICD-10-CM

## 2013-05-06 DIAGNOSIS — R748 Abnormal levels of other serum enzymes: Secondary | ICD-10-CM

## 2013-05-06 DIAGNOSIS — E871 Hypo-osmolality and hyponatremia: Secondary | ICD-10-CM

## 2013-05-06 DIAGNOSIS — E46 Unspecified protein-calorie malnutrition: Secondary | ICD-10-CM | POA: Diagnosis present

## 2013-05-06 DIAGNOSIS — E1142 Type 2 diabetes mellitus with diabetic polyneuropathy: Secondary | ICD-10-CM | POA: Diagnosis present

## 2013-05-06 DIAGNOSIS — E785 Hyperlipidemia, unspecified: Secondary | ICD-10-CM | POA: Diagnosis present

## 2013-05-06 DIAGNOSIS — G479 Sleep disorder, unspecified: Secondary | ICD-10-CM

## 2013-05-06 DIAGNOSIS — K7689 Other specified diseases of liver: Secondary | ICD-10-CM | POA: Diagnosis present

## 2013-05-06 DIAGNOSIS — R61 Generalized hyperhidrosis: Secondary | ICD-10-CM | POA: Diagnosis present

## 2013-05-06 DIAGNOSIS — Z9189 Other specified personal risk factors, not elsewhere classified: Secondary | ICD-10-CM

## 2013-05-06 DIAGNOSIS — C8590 Non-Hodgkin lymphoma, unspecified, unspecified site: Secondary | ICD-10-CM

## 2013-05-06 DIAGNOSIS — I951 Orthostatic hypotension: Secondary | ICD-10-CM | POA: Diagnosis present

## 2013-05-06 DIAGNOSIS — Z79899 Other long term (current) drug therapy: Secondary | ICD-10-CM

## 2013-05-06 DIAGNOSIS — R55 Syncope and collapse: Secondary | ICD-10-CM

## 2013-05-06 DIAGNOSIS — E1149 Type 2 diabetes mellitus with other diabetic neurological complication: Secondary | ICD-10-CM | POA: Diagnosis present

## 2013-05-06 DIAGNOSIS — K5900 Constipation, unspecified: Secondary | ICD-10-CM | POA: Diagnosis not present

## 2013-05-06 DIAGNOSIS — Z8249 Family history of ischemic heart disease and other diseases of the circulatory system: Secondary | ICD-10-CM

## 2013-05-06 DIAGNOSIS — R5383 Other fatigue: Secondary | ICD-10-CM

## 2013-05-06 DIAGNOSIS — D61818 Other pancytopenia: Secondary | ICD-10-CM | POA: Diagnosis present

## 2013-05-06 DIAGNOSIS — R945 Abnormal results of liver function studies: Secondary | ICD-10-CM

## 2013-05-06 DIAGNOSIS — M109 Gout, unspecified: Secondary | ICD-10-CM | POA: Diagnosis present

## 2013-05-06 DIAGNOSIS — Z6830 Body mass index (BMI) 30.0-30.9, adult: Secondary | ICD-10-CM

## 2013-05-06 DIAGNOSIS — K2970 Gastritis, unspecified, without bleeding: Secondary | ICD-10-CM | POA: Diagnosis present

## 2013-05-06 DIAGNOSIS — K449 Diaphragmatic hernia without obstruction or gangrene: Secondary | ICD-10-CM | POA: Diagnosis present

## 2013-05-06 DIAGNOSIS — R7401 Elevation of levels of liver transaminase levels: Secondary | ICD-10-CM | POA: Diagnosis present

## 2013-05-06 DIAGNOSIS — E875 Hyperkalemia: Secondary | ICD-10-CM | POA: Diagnosis present

## 2013-05-06 LAB — URINALYSIS, ROUTINE W REFLEX MICROSCOPIC
BILIRUBIN URINE: NEGATIVE
Glucose, UA: NEGATIVE mg/dL
Hgb urine dipstick: NEGATIVE
Ketones, ur: NEGATIVE mg/dL
LEUKOCYTES UA: NEGATIVE
NITRITE: NEGATIVE
PH: 5.5 (ref 5.0–8.0)
Protein, ur: NEGATIVE mg/dL
SPECIFIC GRAVITY, URINE: 1.02 (ref 1.005–1.030)
UROBILINOGEN UA: 0.2 mg/dL (ref 0.0–1.0)

## 2013-05-06 LAB — COMPREHENSIVE METABOLIC PANEL
ALBUMIN: 3.1 g/dL — AB (ref 3.5–5.2)
ALK PHOS: 340 U/L — AB (ref 39–117)
ALT: 74 U/L — AB (ref 0–53)
ALT: 55 U/L — AB (ref 0–53)
AST: 78 U/L — AB (ref 0–37)
AST: 104 U/L — ABNORMAL HIGH (ref 0–37)
Albumin: 2.5 g/dL — ABNORMAL LOW (ref 3.5–5.2)
Alkaline Phosphatase: 290 U/L — ABNORMAL HIGH (ref 39–117)
BUN: 12 mg/dL (ref 6–23)
BUN: 10 mg/dL (ref 6–23)
CO2: 27 mEq/L (ref 19–32)
CO2: 22 meq/L (ref 19–32)
Calcium: 11.1 mg/dL — ABNORMAL HIGH (ref 8.4–10.5)
Calcium: 10.7 mg/dL — ABNORMAL HIGH (ref 8.4–10.5)
Chloride: 78 mEq/L — ABNORMAL LOW (ref 96–112)
Chloride: 79 mEq/L — ABNORMAL LOW (ref 96–112)
Creatinine, Ser: 0.9 mg/dL (ref 0.4–1.5)
Creatinine, Ser: 0.63 mg/dL (ref 0.50–1.35)
GFR calc Af Amer: >90 mL/min (ref >90)
GFR: 96.03 mL/min (ref >60.00)
Glucose, Bld: 113 mg/dL — ABNORMAL HIGH (ref 70–99)
Glucose, Bld: 115 mg/dL — ABNORMAL HIGH (ref 70–99)
POTASSIUM: 4.9 meq/L (ref 3.7–5.3)
Potassium: 5.4 mEq/L — ABNORMAL HIGH (ref 3.5–5.1)
SODIUM: 114 meq/L — AB (ref 137–147)
Sodium: 116 mEq/L — CL (ref 135–145)
Total Bilirubin: 0.9 mg/dL (ref 0.3–1.2)
Total Bilirubin: 0.9 mg/dL (ref 0.3–1.2)
Total Protein: 7.4 g/dL (ref 6.0–8.3)
Total Protein: 6.5 g/dL (ref 6.0–8.3)

## 2013-05-06 LAB — CBC WITH DIFFERENTIAL/PLATELET
Basophils Absolute: 0 10*3/uL (ref 0.0–0.1)
Basophils Relative: 0 % (ref 0–1)
EOS PCT: 1 % (ref 0–5)
Eosinophils Absolute: 0 10*3/uL (ref 0.0–0.7)
HCT: 39.3 % (ref 39.0–52.0)
Hemoglobin: 14.3 g/dL (ref 13.0–17.0)
LYMPHS PCT: 40 % (ref 12–46)
Lymphs Abs: 1.7 10*3/uL (ref 0.7–4.0)
MCH: 33.8 pg (ref 26.0–34.0)
MCHC: 36.4 g/dL — ABNORMAL HIGH (ref 30.0–36.0)
MCV: 92.9 fL (ref 78.0–100.0)
MONO ABS: 0.5 10*3/uL (ref 0.1–1.0)
Monocytes Relative: 11 % (ref 3–12)
NEUTROS PCT: 48 % (ref 43–77)
Neutro Abs: 2 10*3/uL (ref 1.7–7.7)
PLATELETS: 84 10*3/uL — AB (ref 150–400)
RBC: 4.23 MIL/uL (ref 4.22–5.81)
RDW: 12.4 % (ref 11.5–15.5)
WBC: 4.2 10*3/uL (ref 4.0–10.5)

## 2013-05-06 LAB — PHOSPHORUS: PHOSPHORUS: 5.4 mg/dL — AB (ref 2.3–4.6)

## 2013-05-06 LAB — MAGNESIUM: MAGNESIUM: 1.3 mg/dL — AB (ref 1.5–2.5)

## 2013-05-06 LAB — GLUCOSE, CAPILLARY
Glucose-Capillary: 150 mg/dL — ABNORMAL HIGH (ref 70–99)
Glucose-Capillary: 89 mg/dL (ref 70–99)

## 2013-05-06 LAB — MRSA PCR SCREENING: MRSA by PCR: NEGATIVE

## 2013-05-06 LAB — SAVE SMEAR

## 2013-05-06 LAB — LACTATE DEHYDROGENASE: LDH: 3192 U/L — ABNORMAL HIGH (ref 94–250)

## 2013-05-06 MED ORDER — METOPROLOL SUCCINATE ER 100 MG PO TB24
100.0000 mg | ORAL_TABLET | Freq: Every day | ORAL | Status: DC
  Administered 2013-05-06 – 2013-05-22 (×17): 100 mg via ORAL
  Filled 2013-05-06 (×18): qty 1

## 2013-05-06 MED ORDER — MORPHINE SULFATE 2 MG/ML IJ SOLN
2.0000 mg | INTRAMUSCULAR | Status: DC | PRN
  Administered 2013-05-08 – 2013-05-17 (×7): 2 mg via INTRAVENOUS
  Filled 2013-05-06 (×7): qty 1

## 2013-05-06 MED ORDER — ESOMEPRAZOLE MAGNESIUM 40 MG PO PACK: 40.0000 mg | PACK | Freq: Every day | ORAL | Status: DC

## 2013-05-06 MED ORDER — SODIUM CHLORIDE 0.9 % IV SOLN: INTRAVENOUS | Status: DC

## 2013-05-06 MED ORDER — SODIUM CHLORIDE 0.9 % IJ SOLN
3.0000 mL | Freq: Two times a day (BID) | INTRAMUSCULAR | Status: DC
  Administered 2013-05-06 – 2013-05-16 (×14): 3 mL via INTRAVENOUS

## 2013-05-06 MED ORDER — SODIUM CHLORIDE 0.9 % IV BOLUS (SEPSIS)
500.0000 mL | Freq: Once | INTRAVENOUS | Status: AC
  Administered 2013-05-06: 500 mL via INTRAVENOUS

## 2013-05-06 MED ORDER — SODIUM CHLORIDE 0.9 % IV BOLUS (SEPSIS)
1000.0000 mL | Freq: Once | INTRAVENOUS | Status: AC
  Administered 2013-05-06: 1000 mL via INTRAVENOUS

## 2013-05-06 MED ORDER — INSULIN ASPART 100 UNIT/ML ~~LOC~~ SOLN
0.0000 [IU] | Freq: Three times a day (TID) | SUBCUTANEOUS | Status: DC
  Administered 2013-05-08 – 2013-05-12 (×4): 2 [IU] via SUBCUTANEOUS
  Administered 2013-05-13: 3 [IU] via SUBCUTANEOUS
  Administered 2013-05-13 – 2013-05-17 (×4): 2 [IU] via SUBCUTANEOUS
  Administered 2013-05-17: 3 [IU] via SUBCUTANEOUS
  Administered 2013-05-17 – 2013-05-18 (×2): 2 [IU] via SUBCUTANEOUS
  Administered 2013-05-18: 5 [IU] via SUBCUTANEOUS
  Administered 2013-05-18 – 2013-05-19 (×2): 3 [IU] via SUBCUTANEOUS
  Administered 2013-05-19 – 2013-05-20 (×2): 2 [IU] via SUBCUTANEOUS
  Administered 2013-05-20: 3 [IU] via SUBCUTANEOUS
  Administered 2013-05-21: 5 [IU] via SUBCUTANEOUS

## 2013-05-06 MED ORDER — IBUPROFEN 800 MG PO TABS
400.0000 mg | ORAL_TABLET | Freq: Four times a day (QID) | ORAL | Status: DC | PRN
  Filled 2013-05-06: qty 1

## 2013-05-06 MED ORDER — PANTOPRAZOLE SODIUM 40 MG PO TBEC
80.0000 mg | DELAYED_RELEASE_TABLET | Freq: Every day | ORAL | Status: DC
  Administered 2013-05-07 – 2013-05-19 (×13): 80 mg via ORAL
  Filled 2013-05-06 (×14): qty 2

## 2013-05-06 MED ORDER — SODIUM CHLORIDE 0.9 % IV SOLN: 500.0000 mL | INTRAVENOUS | Status: DC

## 2013-05-06 MED ORDER — SODIUM CHLORIDE 0.9 % IV SOLN
INTRAVENOUS | Status: DC
  Administered 2013-05-06 – 2013-05-08 (×6): via INTRAVENOUS

## 2013-05-06 MED ORDER — OXYCODONE HCL 5 MG PO TABS
5.0000 mg | ORAL_TABLET | ORAL | Status: DC | PRN
  Administered 2013-05-06 – 2013-05-22 (×29): 5 mg via ORAL
  Filled 2013-05-06 (×29): qty 1

## 2013-05-06 NOTE — Telephone Encounter (Signed)
I spoke with Dr. Diona Browner around 2 PM to discuss dispo, further evaluation. See notes, she attempted to contact multiple times, and it appears he was at GI, and Dr. Hilarie Fredrickson sent to ER for evaluation.

## 2013-05-06 NOTE — ED Notes (Signed)
Pt being sent by Pyrtle MD.  Potassium found to be 116.  Pt is disoriented and sleepy, with substernal chest pain and elevated liver enzymes.  Pt has been having CP and has had cardiac and lung work up with no results. Was scheduled to have endoscopy today and found out that he had the low sodium. Has been drinking gatorade at home but Dr. thinks that one of his BP meds may be depleting him of the sodium as well. Has not been eating.

## 2013-05-06 NOTE — Telephone Encounter (Signed)
[facility] lab called a critical Sodium - 116. Results given to Dr Lorelei Pont

## 2013-05-06 NOTE — Telephone Encounter (Signed)
This is documentation for pt . Procedure visit for Endoscopy. Pt was checked in in usual manner with IV started. Then Dr. Hilarie Fredrickson noted labs drawn this am ordered by Dr. Diona Browner. Dr. Hilarie Fredrickson ordered IV to be saline locked and pt go by private car to [employer] . IV was converted to lock by Randall Hiss RN and taken to car( with wife driving ) by K. [place] Therapist, sports and [employer] . Dr. Hilarie Fredrickson had talked in length with pt . and wife as to the seriousness of this low Sodium.

## 2013-05-06 NOTE — H&P (Signed)
[facility] History and Physical  [name] [name] [id number] DOB: 1955-02-12 DOA: 05/06/2013  Referring physician: Dr. Alvino Chapel PCP: [name], MD   Chief Complaint: Abnormal labs  HPI: [name] [name] is a 58 y.o. male  With history of hypertension, peptic ulcer disease, gastroesophageal reflux disease, diet-controlled type 2 diabetes, hyperlipidemia who had had an extensive chest pain workup during his last hospitalization from 04/23/2013 to 04/25/2013 which was negative for cardiac etiology and negative for PE. Patient had presented for upper endoscopy for GI evaluation of his chest pain when labs reviewed showed a continued decline in his sodium to 116 with a potassium of 5.4 and a calcium of 11.1. Patient was subsequently sent from the endoscopy suite to the emergency room to be admitted for further evaluation and management. Patient and wife state that due to patient's chest pain which occurs with eating whether patient has a sensation of the food getting stuck patient has had poor oral intake. Patient was instructed to drink some Gatorade however patient felt that wasn't working well and started drinking water. Per wife patient has been on Carafate as well as a PPI however his chest pain has been refractory to this. Patient denies any fevers, no emesis, no nausea, no diarrhea, occasional chills, occasional cough, a little dysuria about a week ago which has since resolved. Patient denies any hematemesis, no melena, no hematochezia. Patient endorses some constipation. Patient also with neuropathy felt to be secondary to his diabetes. Patient with some complaints of feeling gassy. Patient was seen in the ED labs obtained prior to the ED showed a sodium of 116 potassium of 5.4 chloride of 78 calcium of 11.1 glucose of 113 phosphatase 290 abdomen 3.1 AST 78 ALT 74. CBC had a platelet count of 84 otherwise was within normal limits. Urinalysis done was unremarkable. Chest x-ray is pending. We  were called to admit the patient for further evaluation and management.   Review of Systems: As per history of present illness otherwise negative. Constitutional:  No weight loss, night sweats, Fevers, chills, fatigue.  HEENT:  No headaches, Tooth/dental problems,Sore throat,  No sneezing, itching, ear ache, nasal congestion, post nasal drip,  Cardio-vascular:  No chest pain, Orthopnea, PND, swelling in lower extremities, anasarca, dizziness, palpitations  GI:  No heartburn, indigestion, abdominal pain, nausea, vomiting, diarrhea, change in bowel habits, loss of appetite  Resp:  No shortness of breath with exertion or at rest. No excess mucus, no productive cough, No non-productive cough, No coughing up of blood.No change in color of mucus.No wheezing.No chest wall deformity  Skin:  no rash or lesions.  GU:  no dysuria, change in color of urine, no urgency or frequency. No flank pain.  Musculoskeletal:  No joint pain or swelling. No decreased range of motion. No back pain.  Psych:  No change in mood or affect. No depression or anxiety. No memory loss.   Past Medical History  Diagnosis Date  . GERD (gastroesophageal reflux disease)   . Hypertension   . Peptic ulcer   . Gout   . Hiatal hernia   . Diabetes mellitus without complication   . Hyperlipidemia    Past Surgical History  Procedure Laterality Date  . Pilonidal cyst / sinus excision    . 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 Tonsillectomy    . Colonoscopy     Social History:  reports that he has never smoked. His smokeless tobacco use includes Snuff. He reports that he does not  drink alcohol or use illicit drugs.  Allergies  Allergen Reactions  . Penicillins Hives    Family History  Problem Relation Age of Onset  . Gout Father   . COPD Father   . Diabetes type II Father   . Colon cancer Neg Hx   . Stomach cancer Neg Hx   . Esophageal cancer Neg Hx   . Pancreatic cancer Neg Hx   . Prostate cancer Neg  Hx   . Rectal cancer Neg Hx   . Diabetes Mother      Prior to Admission medications   Medication Sig Start Date End Date Taking? Authorizing Provider  esomeprazole (NEXIUM) 40 MG packet Take 40 mg by mouth daily before breakfast. 05/01/13  Yes Amy E Bedsole, MD  lisinopril (PRINIVIL,ZESTRIL) 40 MG tablet TAKE 1 TABLET BY MOUTH DAILY. PLEASE RESCHEDULE YOUR 6 MONTH FOLLOW UP APPOINTMENT FOR ADDITIONAL REFILLS. 05/03/13  Yes Amy Cletis [place], MD  metoprolol succinate (TOPROL-XL) 100 MG 24 hr tablet Take 100 mg by mouth daily. Take with or immediately following a meal.   Yes Historical Provider, MD  oxyCODONE-acetaminophen (PERCOCET/ROXICET) 5-325 MG per tablet Take 1-2 tablets by mouth every 3 (three) hours as needed for severe pain. 04/25/13  Yes Theodis Blaze, MD  spironolactone (ALDACTONE) 50 MG tablet Take 50 mg by mouth daily.   Yes Historical Provider, MD   Physical Exam: Filed Vitals:   05/06/13 1632  BP: 155/86  Pulse: 88  Temp: 97.7 F (36.5 C)    BP 155/86  Pulse 88  Temp(Src) 97.7 F (36.5 C) (Oral)  SpO2 100%  General:  Appears calm and comfortable Eyes: PERRL, normal lids, irises & conjunctiva ENT: grossly normal hearing, lips & tongue Neck: no LAD, masses or thyromegaly Cardiovascular: RRR, no m/r/g. No LE edema. Telemetry: SR, no arrhythmias  Respiratory: CTA bilaterally, no w/r/r. Normal respiratory effort. Abdomen: soft, ntnd Skin: no rash or induration seen on limited exam Musculoskeletal: grossly normal tone BUE/BLE Psychiatric: grossly normal mood and affect, speech fluent and appropriate Neurologic: grossly non-focal.          Labs on Admission:  Basic Metabolic Panel:  Recent Labs Lab 05/01/13 1214 05/06/13 0821  NA 122* 116*  K 5.0 5.4*  CL 82* 78*  CO2 30 27  GLUCOSE 112* 113*  BUN 27* 12  CREATININE 1.3 0.9  CALCIUM 10.9* 11.1*   Liver Function Tests:  Recent Labs Lab 05/01/13 1214 05/06/13 0821  AST 123* 78*  ALT 40 74*  ALKPHOS  394* 290*  BILITOT 1.0 0.9  PROT 7.5 7.4  ALBUMIN 3.4* 3.1*   No results found for this basename: LIPASE, AMYLASE,  in the last 168 hours No results found for this basename: AMMONIA,  in the last 168 hours CBC:  Recent Labs Lab 05/01/13 1214 05/06/13 1730  WBC 7.6 4.2  NEUTROABS 5.4 2.0  HGB 15.7 14.3  HCT 46.4 39.3  MCV 100.3* 92.9  PLT 172.0 84*   Cardiac Enzymes: No results found for this basename: CKTOTAL, CKMB, CKMBINDEX, TROPONINI,  in the last 168 hours  BNP (last 3 results) No results found for this basename: PROBNP,  in the last 8760 hours CBG:  Recent Labs Lab 05/06/13 1446  GLUCAP 150*    Radiological Exams on Admission: Dg Chest 2 View  05/06/2013   CLINICAL DATA:  Chest pain  EXAM: CHEST  2 VIEW  COMPARISON:  04/23/2013  FINDINGS: Artifact overlies the chest. Heart size is normal. Mediastinal shadows are normal.  The lungs are clear. Ordinary degenerative changes affect the spine. No effusions. BB anterior to the sternum again noted.  IMPRESSION: No active cardiopulmonary disease.   Electronically Signed   By: Nelson Chimes M.D.   On: 05/06/2013 19:28    EKG: Independently reviewed. Normal sinus rhythm with a left anterior fascicular block. Poor R wave progression. No ST-T wave abnormalities. No peaked T waves.  Assessment/Plan Principal Problem:   Hyponatremia Active Problems:   Type II or unspecified type diabetes mellitus with neurological manifestations, not stated as uncontrolled(250.60)   HYPERLIPIDEMIA   GOUT   HYPERTENSION   GERD   PEPTIC ULCER DISEASE   Diabetic neuropathy   Elevated LFTs   Other dysphagia   Chest pain   Hyperkalemia   Thrombocytopenia   Hypercalcemia  #1 severe hyponatremia Likely secondary to hypovolemic hyponatremia in a patient on spironolactone. Patient also with poor oral intake. Patient with hypotension noted in on blood pressure obtained on 05/01/2013 his blood pressure was 90/60. Patient's blood pressure on  05/03/2013 was 100/60. Patient with no signs of volume overload on examination. Will admit patient to the step down unit for closer monitoring at his sodium level is 116. Will check a urine sodium. Check a urine osmolality. Check a serum osmolality. Check a urine creatinine. Check a TSH. Check a cortisol level. Check a chest x-ray. Orthostatics have just been checked in the emergency room and per nurse tech patient's blood pressure went from 146/83 with a pulse of 82 laying to 121/84 with a pulse of 110 sitting to 100/77 with a pulse of 150 standing. We'll give a bolus of 1 L normal saline. Place on normal saline at 125 cc per hour. Check serial BMET. Will hold patient's lisinopril and spironolactone. Follow. If worsening or no improvement may consider a nephrology consultation. Follow.  #2 hyperkalemia Likely secondary to spironolactone. Will discontinue spironolactone. Repeat basic metabolic profile. If potassium is still elevated we'll give Kayexalate. Follow for now.  #3 hypercalcemia Likely secondary to volume depletion. Will check a magnesium level. Check a phosphorus level. Hydrate with IV fluids. If no improvement will need to check a PTH a PTH RP and further evaluation.  #4 elevated LFTs Questionable etiology. Patient is not on excessive amounts of Tylenol. Patient is not on a statin. We'll check an acute hepatitis panel. Check an abdominal ultrasound. Hydrate with IV fluids. On follow. GI will be consulted and we'll be following the patient throughout the hospitalization.  #5 thrombocytopenia Questionable etiology. Patient with no acute infection. Urinalysis is negative. Chest x-ray is pending. Patient with no bleeding. Check LDH, haptoglobin, peripheral smear. Follow for now.  #6 chest pain/dysphagia Patient had an extensive workup for his chest pain included a cardiac workup which was negative. CT of the chest which was obtained was negative for PE. Patient compliant with chest pain with  eating when he feels the food gets stuck. Question possible stricture. Patient was supposed to have a upper endoscopy done today however due to dental abnormalities he has been admitted. We'll consult with GI to follow during the hospitalization and once electrolytes have been corrected may consider upper endoscopy. Will defer to GI.  #7 gastroesophageal reflux disease/peptic ulcer disease PPI.  #8 type 2 diabetes  Patient with recent hemoglobin A1c of 6.1 on 04/25/2013. Place on modified diet. Sliding scale insulin.  #9 orthostatics Hold spironolactone and lisinopril. IVF.  #10 HTN Hold lisinopril and spironolactone secondary to orthostasis. Continue toprol xl.  #9 prophylaxis PPI for GI  prophylaxis. SCDs for DVT prophylaxis.   Code Status: Full Family Communication: Updated patient and wife at bedside. Disposition Plan: Admit to step down unit.  Time spent: 50 mins  [facility] Pager [phone]

## 2013-05-06 NOTE — Progress Notes (Signed)
Left message for patient to return my call.

## 2013-05-06 NOTE — ED Notes (Addendum)
Pt hesitant about standing to give urine sample, will try again

## 2013-05-06 NOTE — ED Provider Notes (Signed)
CSN: [id number]     Arrival date & time 05/06/13  1624 History   First MD Initiated Contact with Patient 05/06/13 1639     Chief Complaint  Patient presents with  . Abnormal Lab     (Consider location/radiation/quality/duration/timing/severity/associated sxs/prior Treatment) HPI 58 yo male presents with abnormal lab findings. Patient had CMP earlier today showing hyponatremia to 116 and hyperkalemia to 5.4. Patient was scheduled for endoscopy today for evaluation of chronic intermittent chest pain and solid food dysphagia. Chest pain refractory to PPI and carafate therapy. Patient advised to report to ED for further evaluation of his abnormal labs.  Patient admits to recent weakness. Denies any Fever/chills, N/V/D/C, Leg swelling, CP, or SOB. Patient admits to polyuria and polydipsia. PMH significant for DM without complication, controlled with Lifestyle. Hx of GERD, PUD, HTN, Hiatal hernia, and HLD. Patient admits to former daily alcohol use of 5 beers per day for 20 years. Patient denies recent alcohol use. Last drink approximately 3 weeks ago.   Past Medical History  Diagnosis Date  . GERD (gastroesophageal reflux disease)   . Hypertension   . Peptic ulcer   . Gout   . Hiatal hernia   . Diabetes mellitus without complication   . Hyperlipidemia    Past Surgical History  Procedure Laterality Date  . Pilonidal cyst / sinus excision    . 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 Tonsillectomy    . Colonoscopy     Family History  Problem Relation Age of Onset  . Gout Father   . COPD Father   . Diabetes type II Father   . Colon cancer Neg Hx   . Stomach cancer Neg Hx   . Esophageal cancer Neg Hx   . Pancreatic cancer Neg Hx   . Prostate cancer Neg Hx   . Rectal cancer Neg Hx   . Diabetes Mother    History  Substance Use Topics  . Smoking status: Never Smoker   . Smokeless tobacco: Current User    Types: Snuff  . Alcohol Use: No     Comment: last drink 3 weeks ago.   Per Pt not drinking alcohol now.  When he did drink about 24 beers per week.    Review of Systems  All other systems reviewed and are negative.      Allergies  Penicillins  Home Medications   No current outpatient prescriptions on file. BP 147/75  Pulse 78  Temp(Src) 98 F (36.7 C) (Oral)  Resp 16  Ht 5\' 4"  (1.626 m)  Wt 188 lb 11.4 oz (85.6 kg)  BMI 32.38 kg/m2  SpO2 99% Physical Exam  Nursing note and vitals reviewed. Constitutional: He is oriented to person, place, and time. He appears well-developed and well-nourished. No distress.  HENT:  Head: Normocephalic and atraumatic.  Mouth/Throat: Uvula is midline, oropharynx is clear and moist and mucous membranes are normal.  Eyes: Conjunctivae and EOM are normal. Pupils are equal, round, and reactive to light. No scleral icterus.  Neck: Trachea normal and phonation normal. Neck supple. No JVD present. Carotid bruit is not present. No tracheal deviation present.  Cardiovascular: Regular rhythm.  Tachycardia present.  Exam reveals no gallop and no friction rub.   No murmur heard. Pulmonary/Chest: Effort normal and breath sounds normal. No respiratory distress. He has no wheezes. He has no rhonchi. He has no rales.  Abdominal: Soft. Bowel sounds are normal. He exhibits distension. He exhibits no pulsatile liver and no  mass. There is no hepatosplenomegaly. There is no tenderness. There is no rigidity, no rebound, no guarding, no CVA tenderness, no tenderness at McBurney's point and negative Murphy's sign.  Musculoskeletal: Normal range of motion. He exhibits no edema.  Neurological: He is alert and oriented to person, place, and time. He has normal strength. He displays no tremor. No cranial nerve deficit or sensory deficit.  CN II-XII grossly intact. Cerebellar function appears intact with finger to nose exam. No pronator drift. No asterixis.   Skin: Skin is warm and dry. He is not diaphoretic.  Mild bilateral telangectasias to  cheeks.   Psychiatric: He has a normal mood and affect. His behavior is normal.    ED Course  Procedures (including critical care time) Labs Review Labs Reviewed  URINALYSIS, ROUTINE W REFLEX MICROSCOPIC - Abnormal; Notable for the following:    Color, Urine AMBER (*)    All other components within normal limits  CBC WITH DIFFERENTIAL - Abnormal; Notable for the following:    MCHC 36.4 (*)    Platelets 84 (*)    All other components within normal limits  OSMOLALITY - Abnormal; Notable for the following:    Osmolality 240 (*)    All other components within normal limits  MAGNESIUM - Abnormal; Notable for the following:    Magnesium 1.3 (*)    All other components within normal limits  LACTATE DEHYDROGENASE - Abnormal; Notable for the following:    LDH 3192 (*)    All other components within normal limits  HAPTOGLOBIN - Abnormal; Notable for the following:    Haptoglobin 369 (*)    All other components within normal limits  PHOSPHORUS - Abnormal; Notable for the following:    Phosphorus 5.4 (*)    All other components within normal limits  COMPREHENSIVE METABOLIC PANEL - Abnormal; Notable for the following:    Sodium 114 (*)    Chloride 79 (*)    Glucose, Bld 115 (*)    Calcium 10.7 (*)    Albumin 2.5 (*)    AST 104 (*)    ALT 55 (*)    Alkaline Phosphatase 340 (*)    All other components within normal limits  BASIC METABOLIC PANEL - Abnormal; Notable for the following:    Sodium 116 (*)    Chloride 80 (*)    Glucose, Bld 119 (*)    All other components within normal limits  CBC - Abnormal; Notable for the following:    WBC 3.4 (*)    RBC 3.92 (*)    HCT 36.4 (*)    Platelets 74 (*)    All other components within normal limits  COMPREHENSIVE METABOLIC PANEL - Abnormal; Notable for the following:    Sodium 119 (*)    Chloride 82 (*)    Albumin 2.3 (*)    AST 83 (*)    Alkaline Phosphatase 317 (*)    All other components within normal limits  BASIC METABOLIC  PANEL - Abnormal; Notable for the following:    Sodium 120 (*)    Chloride 83 (*)    Glucose, Bld 113 (*)    All other components within normal limits  GLUCOSE, CAPILLARY - Abnormal; Notable for the following:    Glucose-Capillary 110 (*)    All other components within normal limits  MRSA PCR SCREENING  SODIUM, URINE, RANDOM  TSH  CORTISOL  CREATININE, URINE, RANDOM  OSMOLALITY, URINE  HEPATITIS PANEL, ACUTE  SAVE SMEAR  PATHOLOGIST SMEAR REVIEW  GLUCOSE, CAPILLARY  MAGNESIUM  GLUCOSE, CAPILLARY  BASIC METABOLIC PANEL  BASIC METABOLIC PANEL   Imaging Review Dg Chest 2 View  05/06/2013   CLINICAL DATA:  Chest pain  EXAM: CHEST  2 VIEW  COMPARISON:  04/23/2013  FINDINGS: Artifact overlies the chest. Heart size is normal. Mediastinal shadows are normal. The lungs are clear. Ordinary degenerative changes affect the spine. No effusions. BB anterior to the sternum again noted.  IMPRESSION: No active cardiopulmonary disease.   Electronically Signed   By: Nelson Chimes M.D.   On: 05/06/2013 19:28     EKG Interpretation None      MDM   Final diagnoses:  Hyponatremia   UA negative for UTI.  CBC shows thrombocytopenia at 84.  CMP from prior to admission to ED shows  Hyponatremia to 116 with hyperkalemia of 5.4 Suspect likely from patient recent decreased oral intake in combination with his K+ sparing diuretic use.  Patient discussed with Dr. Davonna Belling. Plan to admit patient for further management of his electrolyte imbalance.  Discussed results and plan with patient. Patient agrees with plan. Patient started on IV fluids.           Sherrie George, PA-C 05/07/13 1500

## 2013-05-06 NOTE — Progress Notes (Addendum)
History of Present Illness:   Patient is a 58 year old male known to Dr. Hilarie Fredrickson from a screening colonoscopy February 2014. He is referred by PCP for evaluation of chest pain. Patient recently admitted to hospital with substernal chest pain, PE ruled out, troponin negative. Cardiology evaluated and ruled him out for acute coronary event.  The chest pain began approximately one month ago. It often radiates through to bilateral upper shoulders. Carafate tried but didn't help. Pain worse after eating but occurs independently of eating as well. Episodes can last hours at a time. Patient reports intermittent solid food dysphagia but this more of a chronic problem. No odynophagia.   Current Medications, Allergies, Past Medical History, Past Surgical History, Family History and Social History were reviewed in [employer] record.  Studies:   Dg Chest 2 View  04/23/2013   CLINICAL DATA:  Chest pain  EXAM: CHEST  2 VIEW  COMPARISON:  None  FINDINGS: The heart size and mediastinal contours are within normal limits. Both lungs are clear. The visualized skeletal structures are unremarkable. 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  IMPRESSION: No active cardiopulmonary disease.   Electronically Signed   By: [name] M.D.   On: 04/23/2013 18:10   Ct Angio Chest Pe W/cm &/or Wo Cm  04/23/2013   CLINICAL DATA:  Elevated D-dimer.  Chest pain.  EXAM: CT ANGIOGRAPHY CHEST WITH CONTRAST  TECHNIQUE: Multidetector CT imaging of the chest was performed using the standard protocol during bolus administration of intravenous contrast. Multiplanar CT image reconstructions and MIPs were obtained to evaluate the vascular anatomy.  CONTRAST:  155mL OMNIPAQUE IOHEXOL 350 MG/ML SOLN  COMPARISON:  DG CHEST 2 VIEW dated 04/23/2013  FINDINGS: Slight motion artifact in the mediastinum. Thoracic aorta normal in caliber. Atherosclerotic vascular changes are noted of the thoracic aorta.  No aneurysm or dissection. Great vessels patent. Pulmonary arteries are intact. No pulmonary embolus. Cardiomegaly. Coronary artery disease. No pericardial effusion.  No significant mediastinal or hilar adenopathy. Thoracic esophagus unremarkable.  Large airways are patent. Lungs are clear of acute infiltrates. No pleural effusion or pneumothorax.  Visualized upper abdominal structures are unremarkable.  Thyroid is unremarkable. No supraclavicular or axillary lymph nodes are noted. Chest wall is intact. Degenerative changes thoracic spine. No acute abnormality.  Review of the MIP images confirms the above findings.  IMPRESSION: 1. Coronary artery disease.  Cardiomegaly. 2. No evidence of pulmonary emboli.  No acute abnormality.   Electronically Signed   By: Marcello Moores  Register   On: 04/23/2013 22:51   Nm Myocar Multi W/spect W/wall Motion / Ef  04/25/2013   CLINICAL DATA:  Chest pain  EXAM: MYOCARDIAL IMAGING WITH SPECT (REST AND PHARMACOLOGIC-STRESS)  GATED LEFT VENTRICULAR WALL MOTION STUDY  LEFT VENTRICULAR EJECTION FRACTION  TECHNIQUE: Standard myocardial SPECT imaging was performed after resting intravenous injection of 10 mCi Tc-69m sestamibi. Subsequently, intravenous infusion of Lexiscan was performed under the supervision of the Cardiology staff. At peak effect of the drug, 30 mCi Tc-25m sestamibi was injected intravenously and standard myocardial SPECT imaging was performed. Quantitative gated imaging was also performed to evaluate left ventricular wall motion, and estimate left ventricular ejection fraction.  COMPARISON:  None.  FINDINGS: SPECT: Allowing for diaphragmatic attenuation artifact, there are no perfusion defects.  Wall motion:  Normal  Ejection fraction: 78%. End-diastolic volume 53 cc. End systolic volume 11 cc.  IMPRESSION: No perfusion defects allowing for diaphragmatic attenuation artifact.   Electronically  Signed   By: Maryclare Bean M.D.   On: 04/25/2013 16:27    Physical Exam: General:  Pleasant, well developed , male in no acute distress Head: Normocephalic and atraumatic Eyes:  sclerae anicteric, conjunctiva pink  Ears: Normal auditory acuity Lungs: Clear throughout to auscultation Heart: Regular rate and rhythm Abdomen: Soft, non distended, non-tender. No masses, no hepatomegaly. Normal bowel sounds Musculoskeletal: Symmetrical with no gross deformities  Extremities: No edema  Neurological: Alert oriented x 4, grossly nonfocal Psychological:  Alert and cooperative. Normal mood and affect  Assessment and Recommendations:  66. 58 year old male with a one month history of intermittent chest pain. Hospitalized earlier this month with the pain, cardiology evaluation negative. Patient also reports chronic, intermittent solid food dysphagia. His chest pain is refractory to PPI and carafate. For further evaluation patient will be scheduled for EGD with possible dilation.  The benefits, risks, and potential complications of EGD with possible biopsies and/or dilation were discussed with the patient and he agrees to proceed.   2. HTN / DM / very distant history of PUD  Addendum: Pt presented to the endoscopy unit today for EGD arranged Friday when he was seen by Tye Savoy, NP. Labs were reviewed and it is noted his Na has continued to decline and is now 116 (checked this am).  K is now elevated as is Ca, which is likely in part volume depletion. Patient does report sleepiness and also some mild confusion. Muscle tremors but no seizure activity. He has continued to have substernal chest pain which has been quite severe, worse with eating. He did try Gatorade and increasing the salt in his diet over the weekend, but he found Gatorade hurt his chest more and he switched back to water yesterday to maintain hydration. LFTs also continue to elevated, AST/ALT, alk phos for unclear reasons. I recommended transfer to the ED for monitoring and probable admission for hyponatremia and  further evaluation. EMS transport was offered and he preferred private car. He will go immediately to the for evaluation at [facility]. GI team can see him on hospitalized and consider upper endoscopy once electrolytes are improved Would also recommend right upper quadrant ultrasound to evaluate liver and gallbladder Jerene Bears, MD   [facility]     Component Value Date/Time   NA 116* 05/06/2013 0821   K 5.4* 05/06/2013 0821   CL 78* 05/06/2013 0821   CO2 27 05/06/2013 0821   GLUCOSE 113* 05/06/2013 0821   BUN 12 05/06/2013 0821   CREATININE 0.9 05/06/2013 0821   CREATININE 0.83 04/23/2013 1710   CALCIUM 11.1* 05/06/2013 0821   PROT 7.4 05/06/2013 0821   ALBUMIN 3.1* 05/06/2013 0821   AST 78* 05/06/2013 0821   ALT 74* 05/06/2013 0821   ALKPHOS 290* 05/06/2013 0821   BILITOT 0.9 05/06/2013 0821   GFRNONAA >90 04/25/2013 0535   GFRAA >90 04/25/2013 0535

## 2013-05-06 NOTE — ED Notes (Signed)
Pt being sent by Pyrtle MD.  Potassium found to be 116.  Pt is disoriented and sleepy, with substernal chest pain and elevated liver enzymes.  Pt has a saline lock IV.

## 2013-05-07 LAB — OSMOLALITY, URINE: Osmolality, Ur: 598 mOsm/kg (ref 390–1090)

## 2013-05-07 LAB — CBC
HCT: 36.4 % — ABNORMAL LOW (ref 39.0–52.0)
HEMOGLOBIN: 13.1 g/dL (ref 13.0–17.0)
MCH: 33.4 pg (ref 26.0–34.0)
MCHC: 36 g/dL (ref 30.0–36.0)
MCV: 92.9 fL (ref 78.0–100.0)
Platelets: 74 10*3/uL — ABNORMAL LOW (ref 150–400)
RBC: 3.92 MIL/uL — AB (ref 4.22–5.81)
RDW: 12.4 % (ref 11.5–15.5)
WBC: 3.4 10*3/uL — ABNORMAL LOW (ref 4.0–10.5)

## 2013-05-07 LAB — BASIC METABOLIC PANEL
BUN: 11 mg/dL (ref 6–23)
BUN: 13 mg/dL (ref 6–23)
BUN: 13 mg/dL (ref 6–23)
CALCIUM: 9.8 mg/dL (ref 8.4–10.5)
CALCIUM: 9.8 mg/dL (ref 8.4–10.5)
CHLORIDE: 80 meq/L — AB (ref 96–112)
CO2: 27 mEq/L (ref 19–32)
CO2: 27 mEq/L (ref 19–32)
CO2: 22 mEq/L (ref 19–32)
CREATININE: 0.77 mg/dL (ref 0.50–1.35)
CREATININE: 0.65 mg/dL (ref 0.50–1.35)
Calcium: 10.1 mg/dL (ref 8.4–10.5)
Chloride: 83 mEq/L — ABNORMAL LOW (ref 96–112)
Chloride: 83 mEq/L — ABNORMAL LOW (ref 96–112)
Creatinine, Ser: 0.7 mg/dL (ref 0.50–1.35)
GFR calc non Af Amer: >90 mL/min (ref >90)
GFR calc non Af Amer: >90 mL/min (ref >90)
GLUCOSE: 119 mg/dL — AB (ref 70–99)
Glucose, Bld: 113 mg/dL — ABNORMAL HIGH (ref 70–99)
Glucose, Bld: 123 mg/dL — ABNORMAL HIGH (ref 70–99)
Potassium: 4.9 mEq/L (ref 3.7–5.3)
Potassium: 4.9 mEq/L (ref 3.7–5.3)
Potassium: 4.6 mEq/L (ref 3.7–5.3)
SODIUM: 116 meq/L — AB (ref 137–147)
SODIUM: 116 meq/L — AB (ref 137–147)
Sodium: 120 mEq/L — CL (ref 137–147)

## 2013-05-07 LAB — HEPATITIS PANEL, ACUTE
HCV AB: NEGATIVE
Hep A IgM: NONREACTIVE
Hep B C IgM: NONREACTIVE
Hepatitis B Surface Ag: NEGATIVE

## 2013-05-07 LAB — CREATININE, URINE, RANDOM: Creatinine, Urine: 128.8 mg/dL

## 2013-05-07 LAB — CORTISOL: Cortisol, Plasma: 25.6 ug/dL

## 2013-05-07 LAB — COMPREHENSIVE METABOLIC PANEL
ALT: 46 U/L (ref 0–53)
AST: 83 U/L — AB (ref 0–37)
Albumin: 2.3 g/dL — ABNORMAL LOW (ref 3.5–5.2)
Alkaline Phosphatase: 317 U/L — ABNORMAL HIGH (ref 39–117)
BILIRUBIN TOTAL: 0.7 mg/dL (ref 0.3–1.2)
BUN: 12 mg/dL (ref 6–23)
CALCIUM: 10.5 mg/dL (ref 8.4–10.5)
CHLORIDE: 82 meq/L — AB (ref 96–112)
CO2: 27 meq/L (ref 19–32)
Creatinine, Ser: 0.73 mg/dL (ref 0.50–1.35)
GFR calc Af Amer: >90 mL/min (ref >90)
Glucose, Bld: 99 mg/dL (ref 70–99)
Potassium: 5.3 mEq/L (ref 3.7–5.3)
SODIUM: 119 meq/L — AB (ref 137–147)
Total Protein: 6 g/dL (ref 6.0–8.3)

## 2013-05-07 LAB — TSH: TSH: 2.023 u[IU]/mL (ref 0.350–4.500)

## 2013-05-07 LAB — PATHOLOGIST SMEAR REVIEW

## 2013-05-07 LAB — MAGNESIUM: Magnesium: 2.2 mg/dL (ref 1.5–2.5)

## 2013-05-07 LAB — SODIUM, URINE, RANDOM: Sodium, Ur: 16 mEq/L

## 2013-05-07 LAB — GLUCOSE, CAPILLARY
GLUCOSE-CAPILLARY: 90 mg/dL (ref 70–99)
GLUCOSE-CAPILLARY: 86 mg/dL (ref 70–99)
Glucose-Capillary: 110 mg/dL — ABNORMAL HIGH (ref 70–99)
Glucose-Capillary: 131 mg/dL — ABNORMAL HIGH (ref 70–99)

## 2013-05-07 LAB — OSMOLALITY: Osmolality: 240 mOsm/kg — CL (ref 275–300)

## 2013-05-07 LAB — HAPTOGLOBIN: Haptoglobin: 369 mg/dL — ABNORMAL HIGH (ref 45–215)

## 2013-05-07 LAB — ETHANOL: Alcohol, Ethyl (B): <11 mg/dL (ref 0–11)

## 2013-05-07 MED ORDER — MAGNESIUM SULFATE 4000MG/100ML IJ SOLN
4.0000 g | Freq: Once | INTRAMUSCULAR | Status: AC
  Administered 2013-05-07: 4 g via INTRAVENOUS
  Filled 2013-05-07: qty 100

## 2013-05-07 MED ORDER — SODIUM CHLORIDE 0.9 % IV BOLUS (SEPSIS)
500.0000 mL | Freq: Once | INTRAVENOUS | Status: AC
  Administered 2013-05-07: 500 mL via INTRAVENOUS

## 2013-05-07 NOTE — Progress Notes (Signed)
CRITICAL VALUE ALERT  Critical value received:  Sodium level 116  Date of notification:  05/07/2013  Time of notification:  0914  Critical value read back:yes  Nurse who received alert:  Daleen Bo RN  MD notified (1st page):  Tylene Fantasia NP  Time of first page:  0915  MD notified (2nd page):  Time of second page:  Responding MD:  Tylene Fantasia NP  Time MD responded:  [phone]

## 2013-05-07 NOTE — Progress Notes (Signed)
CRITICAL VALUE ALERT  Critical value received:  Na+ 116  Date of notification:  05/07/13  Time of notification:  0232  Critical value read back:yes  Nurse who received alert:  Beacher May, RN  MD notified (1st page):  Tylene Fantasia, NP  Time of first page:  0233  MD notified (2nd page):  Time of second page:  Responding MD:  Tylene Fantasia  Time MD responded:  [place] labs ordered to monitor trend.

## 2013-05-07 NOTE — Progress Notes (Signed)
CRITICAL VALUE ALERT  Critical value received:  Na+ 114   Date of notification:  05/06/13  Time of notification:  2153  Critical value read back:yes  Nurse who received alert:  Beacher May, RN  MD notified (1st page):  Tylene Fantasia, NP  Time of first page:  2153  MD notified (2nd page):  Time of second page:  Responding MD:  Tylene Fantasia, NP  Time MD responded:  2155  500 ml NS bolus ordered and Cont IVF rate changed to 150 ml/hr

## 2013-05-07 NOTE — Progress Notes (Signed)
[facility] PROGRESS NOTE  [name] [name] FTD:[id number] DOB: 07-Nov-1955 DOA: 05/06/2013 PCP: [name], MD  Assessment/Plan: Hyponatremia -Hypovolemic on admission, seems to be euvolemic at present. -Correcting at adequate pace with NS. Up to 120 from 116 on admission. -Now that he is no longer hypovolemic, can consider tolvaptan for treatment, but would prefer to continue IVF today and reassess in am. -Also takes a large amount of free water daily. -TSH/cortisol ok. -Dc spironolactone indefinitely.  Hyperkalemia -Off lisinopril/spironolactone. -Corrected with IVF.  Hypercalcemia -Suspect related to dehydration. -Continue NS and recheck CMET in am.  Dysphagia -For EGD once electrolyte abnormalities corrected.  Orthostatic Hypotension -Resolved.  Transaminitis, mild -AST/Alk phos only. -?closet drinker. This could also explain his severe hyponatremia. Check ETOH level. -Acute hepatitis panel negative. -Abdominal US pending.  DM II -Well controlled.  Code Status: Full Code Family Communication: Patient only  Disposition Plan: Home when ready   Consultants:  GI   Antibiotics:  None   Subjective: No complaints, feels sleepy.  Objective: Filed Vitals:   05/07/13 0800 05/07/13 1000 05/07/13 1115 05/07/13 1345  BP: 132/82 123/69 137/73 147/75  Pulse: 65 75 76 78  Temp: 98.3 F (36.8 C)  99.3 F (37.4 C) 98 F (36.7 C)  TempSrc: Oral  Oral Oral  Resp: $Remo'17 17 16 16  'odMaV$ Height:      Weight:      SpO2: 95% 95% 100% 99%    Intake/Output Summary (Last 24 hours) at 05/07/13 1516 Last data filed at 05/07/13 1500  Gross per 24 hour  Intake 3925.42 ml  Output   2525 ml  Net 1400.42 ml   Filed Weights   05/06/13 1937  Weight: 85.6 kg (188 lb 11.4 oz)    Exam:   General:  AA Ox3  Cardiovascular: RRR  Respiratory: CTA B  Abdomen: S/NT/ND/+BS  Extremities: no C/C/E   Neurologic:  Non-focal.  Data Reviewed: Basic Metabolic  Panel:  Recent Labs Lab 05/06/13 0821 05/06/13 1948 05/06/13 2100 05/07/13 0133 05/07/13 0810 05/07/13 1355  NA 116*  --  114* 116* 119* 120*  K 5.4*  --  4.9 4.9 5.3 4.9  CL 78*  --  79* 80* 82* 83*  CO2 27  --  $R'22 27 27 27  'YZ$ GLUCOSE 113*  --  115* 119* 99 113*  BUN 12  --  $R'10 11 12 13  'ET$ CREATININE 0.9  --  0.63 0.70 0.73 0.77  CALCIUM 11.1*  --  10.7* 10.1 10.5 9.8  MG  --   --  1.3*  --  2.2  --   PHOS  --  5.4*  --   --   --   --    Liver Function Tests:  Recent Labs Lab 05/01/13 1214 05/06/13 0821 05/06/13 2100 05/07/13 0810  AST 123* 78* 104* 83*  ALT 40 74* 55* 46  ALKPHOS 394* 290* 340* 317*  BILITOT 1.0 0.9 0.9 0.7  PROT 7.5 7.4 6.5 6.0  ALBUMIN 3.4* 3.1* 2.5* 2.3*   No results found for this basename: LIPASE, AMYLASE,  in the last 168 hours No results found for this basename: AMMONIA,  in the last 168 hours CBC:  Recent Labs Lab 05/01/13 1214 05/06/13 1730 05/07/13 0810  WBC 7.6 4.2 3.4*  NEUTROABS 5.4 2.0  --   HGB 15.7 14.3 13.1  HCT 46.4 39.3 36.4*  MCV 100.3* 92.9 92.9  PLT 172.0 84* 74*   Cardiac Enzymes: No results found for this basename: CKTOTAL, CKMB,  CKMBINDEX, TROPONINI,  in the last 168 hours BNP (last 3 results) No results found for this basename: PROBNP,  in the last 8760 hours CBG:  Recent Labs Lab 05/06/13 1446 05/06/13 2026 05/07/13 0748 05/07/13 1208  GLUCAP 150* 89 90 110*    Recent Results (from the past 240 hour(s))  MRSA PCR SCREENING     Status: None   Collection Time    05/06/13  8:08 PM      Result Value Ref Range Status   MRSA by PCR NEGATIVE  NEGATIVE Final   Comment:            The GeneXpert MRSA Assay (FDA     approved for NASAL specimens     only), is one component of a     comprehensive MRSA colonization     surveillance program. It is not     intended to diagnose MRSA     infection nor to guide or     monitor treatment for     MRSA infections.     Studies: Dg Chest 2 View  05/06/2013    CLINICAL DATA:  Chest pain  EXAM: CHEST  2 VIEW  COMPARISON:  04/23/2013  FINDINGS: Artifact overlies the chest. Heart size is normal. Mediastinal shadows are normal. The lungs are clear. Ordinary degenerative changes affect the spine. No effusions. BB anterior to the sternum again noted.  IMPRESSION: No active cardiopulmonary disease.   Electronically Signed   By: Nelson Chimes M.D.   On: 05/06/2013 19:28    Scheduled Meds: . insulin aspart  0-15 Units Subcutaneous TID WC  . metoprolol succinate  100 mg Oral Daily  . pantoprazole  80 mg Oral Q1200  . sodium chloride  3 mL Intravenous Q12H   Continuous Infusions: . sodium chloride 150 mL/hr at 05/07/13 1453    Principal Problem:   Hyponatremia Active Problems:   Type II or unspecified type diabetes mellitus with neurological manifestations, not stated as uncontrolled(250.60)   HYPERLIPIDEMIA   GOUT   HYPERTENSION   GERD   PEPTIC ULCER DISEASE   Diabetic neuropathy   Elevated LFTs   Other dysphagia   Chest pain   Hyperkalemia   Thrombocytopenia   Hypercalcemia   Orthostasis    Time spent: 45 minutes. Greater than 50% of this time was spent in direct contact with the patient coordinating care.    Lelon Frohlich  [facility] Pager [phone]  If 7PM-7AM, please contact night-coverage at [email or web address], password [facility] 05/07/2013, 3:16 PM  LOS: 1 day

## 2013-05-07 NOTE — Consult Note (Signed)
Consultation \    Primary Care Physician:  [name], MD Primary Gastroenterologist:    Hilarie Fredrickson               HPI:   [name] [name] is a 58 y.o. male known to Dr. Hilarie Fredrickson from a screening colonoscopy February 2014. He saw [place] in the office last week after being referred by PCP for evaluation of chest pain. Patient had recently been admitted to hospital with substernal chest pain. PE ruled out, troponin negative. Cardiology evaluated and ruled him out for acute coronary event.   The chest pain began approximately one month ago. It radiates through to bilateral upper shoulders. Carafate tried but didn't help. Pain worse after eating but occurs independently of eating as well. Episodes can last hours at a time. Patient reports intermittent solid food dysphagia but this more of a chronic problem. No odynophagia. Patient was scheduled for EGD to be done yesterday but procedure cancelled after labs revealed severe electrolyte abnormalities. He is admitted for evaluation and treatment of severe hyponatremia.   Past Medical History  Diagnosis Date  . GERD (gastroesophageal reflux disease)   . Hypertension   . Peptic ulcer   . Gout   . Hiatal hernia   . Diabetes mellitus without complication   . Hyperlipidemia     Past Surgical History  Procedure Laterality Date  . Pilonidal cyst / sinus excision    . 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 Tonsillectomy    . Colonoscopy      Family History  Problem Relation Age of Onset  . Gout Father   . COPD Father   . Diabetes type II Father   . Colon cancer Neg Hx   . Stomach cancer Neg Hx   . Esophageal cancer Neg Hx   . Pancreatic cancer Neg Hx   . Prostate cancer Neg Hx   . Rectal cancer Neg Hx   . Diabetes Mother      History  Substance Use Topics  . Smoking status: Never Smoker   . Smokeless tobacco: Current User    Types: Snuff  . Alcohol Use: No     Comment: last drink 3 weeks ago.  Per Pt not drinking alcohol now.  When  he did drink about 24 beers per week.    Prior to Admission medications   Medication Sig Start Date End Date Taking? Authorizing Provider  esomeprazole (NEXIUM) 40 MG packet Take 40 mg by mouth daily before breakfast. 05/01/13  Yes Amy E Bedsole, MD  lisinopril (PRINIVIL,ZESTRIL) 40 MG tablet TAKE 1 TABLET BY MOUTH DAILY. PLEASE RESCHEDULE YOUR 6 MONTH FOLLOW UP APPOINTMENT FOR ADDITIONAL REFILLS. 05/03/13  Yes Amy Cletis Athens, MD  metoprolol succinate (TOPROL-XL) 100 MG 24 hr tablet Take 100 mg by mouth daily. Take with or immediately following a meal.   Yes Historical Provider, MD  oxyCODONE-acetaminophen (PERCOCET/ROXICET) 5-325 MG per tablet Take 1-2 tablets by mouth every 3 (three) hours as needed for severe pain. 04/25/13  Yes Theodis Blaze, MD  spironolactone (ALDACTONE) 50 MG tablet Take 50 mg by mouth daily.   Yes Historical Provider, MD    Current Facility-Administered Medications  Medication Dose Route Frequency Provider Last Rate Last Dose  . 0.9 %  sodium chloride infusion   Intravenous Continuous Gardiner Barefoot, NP 150 mL/hr at 05/06/13 2334    . ibuprofen (ADVIL,MOTRIN) tablet 400 mg  400 mg Oral Q6H PRN Eugenie Filler, MD      .  insulin aspart (novoLOG) injection 0-15 Units  0-15 Units Subcutaneous TID WC Eugenie Filler, MD      . metoprolol succinate (TOPROL-XL) 24 hr tablet 100 mg  100 mg Oral Daily Eugenie Filler, MD   100 mg at 05/06/13 2139  . morphine 2 MG/ML injection 2 mg  2 mg Intravenous Q4H PRN Eugenie Filler, MD      . oxyCODONE (Oxy IR/ROXICODONE) immediate release tablet 5 mg  5 mg Oral Q4H PRN Eugenie Filler, MD   5 mg at 05/06/13 2139  . pantoprazole (PROTONIX) EC tablet 80 mg  80 mg Oral Q1200 Eugenie Filler, MD      . sodium chloride 0.9 % injection 3 mL  3 mL Intravenous Q12H Eugenie Filler, MD   3 mL at 05/06/13 2139   Facility-Administered Medications Ordered in Other Encounters  Medication Dose Route Frequency Provider Last Rate Last  Dose  . 0.9 %  sodium chloride infusion  500 mL Intravenous Continuous Jerene Bears, MD        Allergies as of 05/06/2013 - Review Complete 05/06/2013  Allergen Reaction Noted  . Penicillins Hives 12/16/2005    Review of Systems:    All systems reviewed and negative except where noted in HPI.   Physical Exam:  Vital signs in last 24 hours: Temp:  [97.4 F (36.3 C)-98.4 F (36.9 C)] 98.3 F (36.8 C) (03/31 0800) Pulse Rate:  [65-115] 65 (03/31 0800) Resp:  [16-29] 17 (03/31 0800) BP: (100-157)/(58-94) 132/82 mmHg (03/31 0800) SpO2:  [94 %-100 %] 95 % (03/31 0800) Weight:  [188 lb (85.276 kg)-188 lb 11.4 oz (85.6 kg)] 188 lb 11.4 oz (85.6 kg) (03/30 1937)   General:   Pleasant white male in NAD Head:  Normocephalic and atraumatic. Eyes:   No icterus.   Conjunctiva pink. Ears:  Normal auditory acuity. Neck:  Supple; no masses felt Lungs:  Respirations even and unlabored. Lungs clear to auscultation bilaterally.   No wheezes, crackles, or rhonchi.  Heart:  Regular rate and rhythm Abdomen:  Soft, nondistended, nontender. Normal bowel sounds. No appreciable masses or hepatomegaly.  Msk:  Symmetrical without gross deformities.  Extremities:  Without edema. Neurologic:  Alert and  oriented x4;  grossly normal neurologically. Skin:  Intact without significant lesions or rashes. Cervical Nodes:  No significant cervical adenopathy. Psych:  Alert and cooperative. Normal affect.  LAB RESULTS:  Recent Labs  05/06/13 1730 05/07/13 0810  WBC 4.2 3.4*  HGB 14.3 13.1  HCT 39.3 36.4*  PLT 84* PENDING   BMET  Recent Labs  05/06/13 2100 05/07/13 0133 05/07/13 0810  NA 114* 116* 119*  K 4.9 4.9 5.3  CL 79* 80* 82*  CO2 _0 GLUCOSE 115* 119* 99  BUN _1 CREATININE 0.63 0.70 0.73  CALCIUM 10.7* 10.1 10.5   LFT  Recent Labs  05/07/13 0810  PROT 6.0  ALBUMIN 2.3*  AST 83*  ALT 46  ALKPHOS 317*  BILITOT 0.7   PT/INR No results found for this basename:  LABPROT, INR,  in the last 72 hours  STUDIES: Dg Chest 2 View  05/06/2013   CLINICAL DATA:  Chest pain  EXAM: CHEST  2 VIEW  COMPARISON:  04/23/2013  FINDINGS: Artifact overlies the chest. Heart size is normal. Mediastinal shadows are normal. The lungs are clear. Ordinary degenerative changes affect the spine. No effusions. BB anterior to the sternum again noted.  IMPRESSION: No active cardiopulmonary disease.  Electronically Signed   By: Nelson Chimes M.D.   On: 05/06/2013 19:28    PREVIOUS ENDOSCOPIES:            Screening colonoscopy with polypectomy Feb 2014. Findings:  ENDOSCOPIC IMPRESSION:  1. Sessile polyp measuring 5 mm in size was found in the ascending  colon; polypectomy was performed with a cold snare  2. Pedunculated polyp measuring 8 mm in size was found in the  sigmoid colon; polypectomy was performed using snare cautery  3. Sessile polyp measuring 5 mm in size was found in the  rectosigmoid colon; polypectomy was performed with a cold snare  4. Sessile polyp measuring 4 mm in size was found in rectum seen  upon the retroflexed view; polypectomy was performed with cold  forceps  5. There was moderate diverticulosis noted in the descending colon  and sigmoid colon  6. Moderate sized internal hemorrhoids    Impression / Plan:   25. 58 year old male with one month history of chest pain radiating into and across upper shoulders. He has associated weight loss and intermittent solid food dysphagia which is more of a chronic issue. Cardiology ruled out coronary event during recent hospitalization. CTA chest negative for PE. Will need eventual EGD (when electrolytes corrected).   2. Severe hyponatremia felt to be secondary to aldactone, decrease oral intake. Orthostatic in ED but interestingly his renal function is normal. Primary team correcting sodium which is up to 119 today. Wonder about ?SIADH from an underlying malignancy  3. Hypercalcemia, workup ongoing per primary team.     4. Hyperkalemia, on Aldactone at home.   5. Elevated LFTs. New elevation in alk phos, in 300 range. Bili is normal, AST almost 3x normal, ALT mildly elevated. No abdominal pain. Will obtain abdominal ultrasound if not already ordered.     Thanks   LOS: 1 day   Tye Savoy  05/07/2013, 8:56 AM  ________________________________________________________________________  Velora Heckler GI MD note:  I personally examined the patient, reviewed the data and agree with the assessment and plan described above.  He has very low sodium, tells me he drinks water "all the time" 6 large cups per day, always thirsty.  This needs to be more fully understood, hypnatremia corrected.  ?adrenal insuf, ? .  I will leave to hospitalist.  For now, GI workup on hold.    Owens Loffler, MD [facility] Pager [phone]

## 2013-05-07 NOTE — Progress Notes (Addendum)
INITIAL NUTRITION ASSESSMENT  DOCUMENTATION CODES Per approved criteria  -Obesity Grade 1   INTERVENTION:  Counseled pt to decrease water intake.  (Patient stated that he was drinking approximately 20-25  12 ounce water bottles daily.)  Encouraged intake of meals  NUTRITION DIAGNOSIS: Predicted sub-optimal intake related to decreased appetite and excessive fluid intake AEB diet hx.  Goal: Intake of meals to meet >75% estimated needs.  Monitor:  Intake, labs, weight trend  Reason for Assessment: MST  58 y.o. male  Admitting Dx: Hyponatremia  ASSESSMENT: Patient admitted with severe hyponatremia.  Patient states that prior to admit, his mouth would stay dry and he was drinking 2-3 12 ounce bottles of water every hour while awake (20-25 daily per patient}.  HgbA1C 6.1 04/25/2013 and improved from last year.  States that he has been trying to eat more healthfully recently.  Some difficulty swallowing solids at times with hx or reflux noted.    Height: Ht Readings from Last 1 Encounters:  05/06/13 5\' 4"  (1.626 m)    Weight: Wt Readings from Last 1 Encounters:  05/06/13 188 lb 11.4 oz (85.6 kg)    Ideal Body Weight: 130 lbs  % Ideal Body Weight: 145  Wt Readings from Last 10 Encounters:  05/06/13 188 lb 11.4 oz (85.6 kg)  05/06/13 188 lb (85.276 kg)  05/03/13 188 lb 8 oz (85.503 kg)  05/01/13 189 lb 4 oz (85.843 kg)  04/24/13 192 lb 1.6 oz (87.136 kg)  04/23/13 194 lb 12 oz (88.338 kg)  04/16/13 197 lb 8 oz (89.585 kg)  04/27/12 210 lb (95.255 kg)  03/26/12 212 lb (96.163 kg)  03/12/12 212 lb 12.8 oz (96.525 kg)    Usual Body Weight: 197 lbs 3 weeks ago  % Usual Body Weight: 95.4  BMI:  Body mass index is 32.38 kg/(m^2).  Estimated Nutritional Needs: Kcal: 1900-2000 Protein: 70-80 gm  Fluid: 1.9-2L daily  Skin: intact  Diet Order: Carb Control  EDUCATION NEEDS: -Education needs addressed   Intake/Output Summary (Last 24 hours) at 05/07/13  1419 Last data filed at 05/07/13 1344  Gross per 24 hour  Intake 3175.42 ml  Output   2525 ml  Net 650.42 ml     Labs:   Recent Labs Lab 05/06/13 0821 05/06/13 1948 05/06/13 2100 05/07/13 0133 05/07/13 0810  NA 116*  --  114* 116* 119*  K 5.4*  --  4.9 4.9 5.3  CL 78*  --  79* 80* 82*  CO2 27  --  22 27 27   BUN 12  --  10 11 12   CREATININE 0.9  --  0.63 0.70 0.73  CALCIUM 11.1*  --  10.7* 10.1 10.5  MG  --   --  1.3*  --  2.2  PHOS  --  5.4*  --   --   --   GLUCOSE 113*  --  115* 119* 99    CBG (last 3)   Recent Labs  05/06/13 2026 05/07/13 0748 05/07/13 1208  GLUCAP 89 90 110*    Scheduled Meds: . insulin aspart  0-15 Units Subcutaneous TID WC  . metoprolol succinate  100 mg Oral Daily  . pantoprazole  80 mg Oral Q1200  . sodium chloride  3 mL Intravenous Q12H    Continuous Infusions: . sodium chloride 150 mL/hr at 05/07/13 0800    Past Medical History  Diagnosis Date  . GERD (gastroesophageal reflux disease)   . Hypertension   . Peptic ulcer   .  Gout   . Hiatal hernia   . Diabetes mellitus without complication   . Hyperlipidemia     Past Surgical History  Procedure Laterality Date  . Pilonidal cyst / sinus excision    . 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 Tonsillectomy    . Colonoscopy      Antonieta Iba, RD, LDN Clinical Inpatient Dietitian Pager:  [phone] Weekend and after hours pager:  [phone]

## 2013-05-08 ENCOUNTER — Inpatient Hospital Stay (HOSPITAL_COMMUNITY): Payer: No Typology Code available for payment source

## 2013-05-08 DIAGNOSIS — K219 Gastro-esophageal reflux disease without esophagitis: Secondary | ICD-10-CM

## 2013-05-08 DIAGNOSIS — E1149 Type 2 diabetes mellitus with other diabetic neurological complication: Secondary | ICD-10-CM

## 2013-05-08 DIAGNOSIS — D696 Thrombocytopenia, unspecified: Secondary | ICD-10-CM

## 2013-05-08 LAB — COMPREHENSIVE METABOLIC PANEL
ALBUMIN: 2.2 g/dL — AB (ref 3.5–5.2)
ALK PHOS: 256 U/L — AB (ref 39–117)
ALT: 34 U/L (ref 0–53)
AST: 55 U/L — ABNORMAL HIGH (ref 0–37)
BUN: 12 mg/dL (ref 6–23)
CHLORIDE: 86 meq/L — AB (ref 96–112)
CO2: 24 mEq/L (ref 19–32)
Calcium: 10.5 mg/dL (ref 8.4–10.5)
Creatinine, Ser: 0.73 mg/dL (ref 0.50–1.35)
GFR calc Af Amer: >90 mL/min (ref >90)
Glucose, Bld: 90 mg/dL (ref 70–99)
POTASSIUM: 4.5 meq/L (ref 3.7–5.3)
Sodium: 121 mEq/L — CL (ref 137–147)
Total Bilirubin: 0.8 mg/dL (ref 0.3–1.2)
Total Protein: 5.6 g/dL — ABNORMAL LOW (ref 6.0–8.3)

## 2013-05-08 LAB — BASIC METABOLIC PANEL
BUN: 12 mg/dL (ref 6–23)
BUN: 13 mg/dL (ref 6–23)
CALCIUM: 10.2 mg/dL (ref 8.4–10.5)
CO2: 23 meq/L (ref 19–32)
CO2: 22 meq/L (ref 19–32)
Calcium: 10.3 mg/dL (ref 8.4–10.5)
Chloride: 86 mEq/L — ABNORMAL LOW (ref 96–112)
Chloride: 83 mEq/L — ABNORMAL LOW (ref 96–112)
Creatinine, Ser: 0.63 mg/dL (ref 0.50–1.35)
Creatinine, Ser: 0.63 mg/dL (ref 0.50–1.35)
GFR calc Af Amer: >90 mL/min (ref >90)
GFR calc Af Amer: >90 mL/min (ref >90)
GFR calc non Af Amer: >90 mL/min (ref >90)
GLUCOSE: 90 mg/dL (ref 70–99)
Glucose, Bld: 110 mg/dL — ABNORMAL HIGH (ref 70–99)
POTASSIUM: 4.4 meq/L (ref 3.7–5.3)
Potassium: 4.4 mEq/L (ref 3.7–5.3)
SODIUM: 118 meq/L — AB (ref 137–147)
SODIUM: 120 meq/L — AB (ref 137–147)

## 2013-05-08 LAB — GLUCOSE, CAPILLARY
GLUCOSE-CAPILLARY: 123 mg/dL — AB (ref 70–99)
GLUCOSE-CAPILLARY: 107 mg/dL — AB (ref 70–99)
Glucose-Capillary: 92 mg/dL (ref 70–99)
Glucose-Capillary: 122 mg/dL — ABNORMAL HIGH (ref 70–99)

## 2013-05-08 LAB — CBC
HCT: 33.7 % — ABNORMAL LOW (ref 39.0–52.0)
Hemoglobin: 11.8 g/dL — ABNORMAL LOW (ref 13.0–17.0)
MCH: 33.1 pg (ref 26.0–34.0)
MCHC: 35 g/dL (ref 30.0–36.0)
MCV: 94.7 fL (ref 78.0–100.0)
Platelets: 66 10*3/uL — ABNORMAL LOW (ref 150–400)
RBC: 3.56 MIL/uL — AB (ref 4.22–5.81)
RDW: 12.5 % (ref 11.5–15.5)
WBC: 3.4 10*3/uL — AB (ref 4.0–10.5)

## 2013-05-08 MED ORDER — TOLVAPTAN 15 MG PO TABS
15.0000 mg | ORAL_TABLET | ORAL | Status: DC
  Administered 2013-05-08 – 2013-05-09 (×2): 15 mg via ORAL
  Filled 2013-05-08 (×4): qty 1

## 2013-05-08 NOTE — Progress Notes (Signed)
   CARE MANAGEMENT NOTE 05/08/2013  Patient:  [name] [name],[name] [name]   Account Number:  [id number]  Date Initiated:  05/08/2013  Documentation initiated by:  Gabriel Earing  Subjective/Objective Assessment:   PT ADMITTED WITH LABS     Action/Plan:   FROM HOME   Anticipated DC Date:  05/10/2013   Anticipated DC Plan:  [place]  CM consult      Choice offered to / List presented to:             Status of service:  In process, will continue to follow Medicare Important Message given?  NA - LOS <3 / Initial given by admissions (If response is "NO", the following Medicare IM given date fields will be blank) Date Medicare IM given:   Date Additional Medicare IM given:    Discharge Disposition:    Per UR Regulation:  Reviewed for med. necessity/level of care/duration of stay  If discussed at [place] of Stay Meetings, dates discussed:    Comments:  05/08/13 [id number], RN, BSN Pt admitted with abnormal labs. NA 121 from home. Plan to dc home with no needs at present time.

## 2013-05-08 NOTE — Progress Notes (Signed)
[facility] PROGRESS NOTE  [name] [name] JJH:[id number] DOB: 21-Jan-1956 DOA: 05/06/2013 PCP: [name], MD  Assessment/Plan: Hyponatremia -Hypovolemic on admission, seems to be euvolemic at present. -Correcting at adequate pace with NS. Up to 121 from 116 on admission. - will start him on tolvaptan if the sodium level does n't improve.  -Also takes a large amount of free water daily. -TSH/cortisol ok. -Dc spironolactone indefinitely.  Hyperkalemia -Off lisinopril/spironolactone. -Corrected with IVF.  Hypercalcemia -Suspect related to dehydration. -Continue NS and recheck CMET in am.  Dysphagia -For EGD once electrolyte abnormalities corrected.  Orthostatic Hypotension -Resolved.  Transaminitis, mild -AST/Alk phos only. -?closet drinker. This could also explain his severe hyponatremia.  -Acute hepatitis panel negative. -Abdominal US negative.   DM II -Well controlled.  Code Status: Full Code Family Communication: Patient only discussed with wife over the phone.  Disposition Plan: Home when ready   Consultants:  GI   Antibiotics:  None   Subjective: No complaints, feels sleepy.   Objective: Filed Vitals:   05/07/13 1345 05/07/13 2058 05/08/13 0535 05/08/13 1428  BP: 147/75 140/77 122/69 132/82  Pulse: 78 85 79 87  Temp: 98 F (36.7 C) 100.3 F (37.9 C) 99.3 F (37.4 C) 99.8 F (37.7 C)  TempSrc: Oral Oral Oral Oral  Resp: $Remo'16 17 16 18  'PRIGF$ Height:      Weight:   86.864 kg (191 lb 8 oz)   SpO2: 99% 97% 96% 97%    Intake/Output Summary (Last 24 hours) at 05/08/13 1626 Last data filed at 05/08/13 1500  Gross per 24 hour  Intake   4080 ml  Output   2725 ml  Net   1355 ml   Filed Weights   05/06/13 1937 05/08/13 0535  Weight: 85.6 kg (188 lb 11.4 oz) 86.864 kg (191 lb 8 oz)    Exam:   General:  AA Ox3  Cardiovascular: RRR  Respiratory: CTA B  Abdomen: S/NT/ND/+BS  Extremities: no C/C/E   Neurologic:  Non-focal.  Data  Reviewed: Basic Metabolic Panel:  Recent Labs Lab 05/06/13 0821 05/06/13 1948 05/06/13 2100  05/07/13 0810 05/07/13 1355 05/07/13 2020 05/08/13 0040 05/08/13 0341  NA 116*  --  114*  < > 119* 120* 116* 120* 121*  K 5.4*  --  4.9  < > 5.3 4.9 4.6 4.4 4.5  CL 78*  --  79*  < > 82* 83* 83* 86* 86*  CO2 27  --  22  < > $R'27 27 22 22 24  'YX$ GLUCOSE 113*  --  115*  < > 99 113* 123* 90 90  BUN 12  --  10  < > $R'12 13 13 12 12  'yl$ CREATININE 0.9  --  0.63  < > 0.73 0.77 0.65 0.63 0.73  CALCIUM 11.1*  --  10.7*  < > 10.5 9.8 9.8 10.2 10.5  MG  --   --  1.3*  --  2.2  --   --   --   --   PHOS  --  5.4*  --   --   --   --   --   --   --   < > = values in this interval not displayed. Liver Function Tests:  Recent Labs Lab 05/06/13 0821 05/06/13 2100 05/07/13 0810 05/08/13 0341  AST 78* 104* 83* 55*  ALT 74* 55* 46 34  ALKPHOS 290* 340* 317* 256*  BILITOT 0.9 0.9 0.7 0.8  PROT 7.4 6.5 6.0 5.6*  ALBUMIN  3.1* 2.5* 2.3* 2.2*   No results found for this basename: LIPASE, AMYLASE,  in the last 168 hours No results found for this basename: AMMONIA,  in the last 168 hours CBC:  Recent Labs Lab 05/06/13 1730 05/07/13 0810 05/08/13 0341  WBC 4.2 3.4* 3.4*  NEUTROABS 2.0  --   --   HGB 14.3 13.1 11.8*  HCT 39.3 36.4* 33.7*  MCV 92.9 92.9 94.7  PLT 84* 74* 66*   Cardiac Enzymes: No results found for this basename: CKTOTAL, CKMB, CKMBINDEX, TROPONINI,  in the last 168 hours BNP (last 3 results) No results found for this basename: PROBNP,  in the last 8760 hours CBG:  Recent Labs Lab 05/07/13 1208 05/07/13 1646 05/07/13 2103 05/08/13 0736 05/08/13 1151  GLUCAP 110* 86 131* 92 122*    Recent Results (from the past 240 hour(s))  MRSA PCR SCREENING     Status: None   Collection Time    05/06/13  8:08 PM      Result Value Ref Range Status   MRSA by PCR NEGATIVE  NEGATIVE Final   Comment:            The GeneXpert MRSA Assay (FDA     approved for NASAL specimens     only), is  one component of a     comprehensive MRSA colonization     surveillance program. It is not     intended to diagnose MRSA     infection nor to guide or     monitor treatment for     MRSA infections.     Studies: Dg Chest 2 View  05/06/2013   CLINICAL DATA:  Chest pain  EXAM: CHEST  2 VIEW  COMPARISON:  04/23/2013  FINDINGS: Artifact overlies the chest. Heart size is normal. Mediastinal shadows are normal. The lungs are clear. Ordinary degenerative changes affect the spine. No effusions. BB anterior to the sternum again noted.  IMPRESSION: No active cardiopulmonary disease.   Electronically Signed   By: Nelson Chimes M.D.   On: 05/06/2013 19:28   US Abdomen Complete  05/08/2013   CLINICAL DATA:  Elevated LFTs  EXAM: ULTRASOUND ABDOMEN COMPLETE  COMPARISON:  None.  FINDINGS: Gallbladder:  No gallstones or wall thickening visualized. No sonographic Murphy sign noted.  Common bile duct:  Diameter: 3.4 mm  Liver:  Mildly increased in echogenicity likely related to fatty infiltration. There is an 8 mm relatively hypoechoic lesion within the right lobe of the liver. Some slight increased echogenicity is noted within. This is of uncertain etiology.  IVC:  No abnormality visualized.  Pancreas:  Visualized portion unremarkable.  Spleen:  Size and appearance within normal limits.  Right Kidney:  Length: 12.1 cm. Echogenicity within normal limits. No mass or hydronephrosis visualized.  Left Kidney:  Length: 12.2 cm. Echogenicity within normal limits. No mass or hydronephrosis visualized.  Abdominal aorta:  No aneurysm visualized.  Other findings:  None.  IMPRESSION: Hypoechoic lesion within the liver as described. This is incompletely evaluated on this exam. Short-term followup may be helpful to assess for stability. Alternatively nonemergent MRI of the liver could be performed.   Electronically Signed   By: Inez Catalina M.D.   On: 05/08/2013 11:51    Scheduled Meds: . insulin aspart  0-15 Units Subcutaneous TID  WC  . metoprolol succinate  100 mg Oral Daily  . pantoprazole  80 mg Oral Q1200  . sodium chloride  3 mL Intravenous Q12H   Continuous Infusions: .  sodium chloride 150 mL/hr at 05/08/13 9528    Principal Problem:   Hyponatremia Active Problems:   Type II or unspecified type diabetes mellitus with neurological manifestations, not stated as uncontrolled(250.60)   HYPERLIPIDEMIA   GOUT   HYPERTENSION   GERD   PEPTIC ULCER DISEASE   Diabetic neuropathy   Elevated LFTs   Other dysphagia   Chest pain   Hyperkalemia   Thrombocytopenia   Hypercalcemia   Orthostasis    Time spent: 45 minutes. Greater than 50% of this time was spent in direct contact with the patient coordinating care.    [facility] Pager [phone]  If 7PM-7AM, please contact night-coverage at [email or web address], password [facility] 05/08/2013, 4:26 PM  LOS: 2 days

## 2013-05-08 NOTE — Progress Notes (Signed)
Progress Note   Subjective  back from ultrasound. Feels okay. Some occasional sharp chest pain radiate upward toward left shoulder.    Objective   Vital signs in last 24 hours: Temp:  [98 F (36.7 C)-100.3 F (37.9 C)] 99.3 F (37.4 C) (04/01 0535) Pulse Rate:  [75-85] 79 (04/01 0535) Resp:  [16-17] 16 (04/01 0535) BP: (122-147)/(69-77) 122/69 mmHg (04/01 0535) SpO2:  [95 %-100 %] 96 % (04/01 0535) Weight:  [191 lb 8 oz (86.864 kg)] 191 lb 8 oz (86.864 kg) (04/01 0535) Last BM Date: 04/28/13 General:    white male in NAD Heart:  Regular rate and rhythm Abdomen:  Soft, protuberant, nontender  Normal bowel sounds. Extremities:  Without edema. Neurologic:  Alert and oriented,  grossly normal neurologically. Psych:  Cooperative. Normal mood and affect.   Lab Results:  Recent Labs  05/06/13 1730 05/07/13 0810 05/08/13 0341  WBC 4.2 3.4* 3.4*  HGB 14.3 13.1 11.8*  HCT 39.3 36.4* 33.7*  PLT 84* 74* 66*   BMET  Recent Labs  05/07/13 2020 05/08/13 0040 05/08/13 0341  NA 116* 120* 121*  K 4.6 4.4 4.5  CL 83* 86* 86*  CO2 $Re'22 22 24  'blS$ GLUCOSE 123* 90 90  BUN $Re'13 12 12  'IuY$ CREATININE 0.65 0.63 0.73  CALCIUM 9.8 10.2 10.5   LFT  Recent Labs  05/08/13 0341  PROT 5.6*  ALBUMIN 2.2*  AST 55*  ALT 34  ALKPHOS 256*  BILITOT 0.8    Studies/Results: Dg Chest 2 View  05/06/2013   CLINICAL DATA:  Chest pain  EXAM: CHEST  2 VIEW  COMPARISON:  04/23/2013  FINDINGS: Artifact overlies the chest. Heart size is normal. Mediastinal shadows are normal. The lungs are clear. Ordinary degenerative changes affect the spine. No effusions. BB anterior to the sternum again noted.  IMPRESSION: No active cardiopulmonary disease.   Electronically Signed   By: Nelson Chimes M.D.   On: 05/06/2013 19:28   EXAM:  ULTRASOUND ABDOMEN COMPLETE  COMPARISON: None.  FINDINGS:  Gallbladder:  No gallstones or wall thickening visualized. No sonographic Murphy  sign noted.  Common bile duct:    Diameter: 3.4 mm  Liver:  Mildly increased in echogenicity likely related to fatty  infiltration. There is an 8 mm relatively hypoechoic lesion within  the right lobe of the liver. Some slight increased echogenicity is  noted within. This is of uncertain etiology.  IVC:  No abnormality visualized.  Pancreas:  Visualized portion unremarkable.  Spleen:  Size and appearance within normal limits.  Right Kidney:  Length: 12.1 cm. Echogenicity within normal limits. No mass or  hydronephrosis visualized.  Left Kidney:  Length: 12.2 cm. Echogenicity within normal limits. No mass or  hydronephrosis visualized.  Abdominal aorta:  No aneurysm visualized.  Other findings:  None.  IMPRESSION:  Hypoechoic lesion within the liver as described. This is  incompletely evaluated on this exam. Short-term followup may be  helpful to assess for stability. Alternatively nonemergent MRI of  the liver could be performed.    Assessment / Plan:   32. 58 year old male with one month history of chest pain radiating into and across upper shoulders. He has associated weight loss and intermittent solid food dysphagia which is more of a chronic issue. Will need eventual EGD. Ultrasound done today was basically unrevealing for source of pain, see above.   2. Severe hyponatremia.  Primary team correcting sodium which is up to 120 today. Wonder about ?SIADH.  3. Elevated LFTs. New elevation in alk phos, in 300 range but down to 256 today. AST was nearly 3x normal, also improving.  No abdominal pain. Abdominal  ultrasound was nondiagnostic. Will consider other sources of elevated alk phos / AST (? Bone).   4. 74mm  hypoechoic liver lesion on ultrasound. Likely incidental finding and unrelated to symptoms / abnormal LFTs. May still need follow up imaging at some point.     LOS: 2 days   Tye Savoy  05/08/2013, 9:55 AM

## 2013-05-08 NOTE — Progress Notes (Signed)
Pharmacist-Physician Communication  Discussed the use of Tolvaptan with Dr. Karleen Hampshire.  She is aware that in order to receive Tolvaptan, the patient must have either failed a demeclocycline trial, or a reason must be documented in the chart as to why no trial of demeclocycline was given.    [place] DellsPh. 05/08/2013 10:52 PM

## 2013-05-08 NOTE — Progress Notes (Signed)
Patient refused for lab to collect am blood labs.  Dr. Karleen Hampshire notified.  Will ask lab to try later this am.  Will continue to monitor patient.

## 2013-05-08 NOTE — Progress Notes (Signed)
CRITICAL VALUE ALERT  Critical value received:  Sodium 121  Date of notification:  05/08/2013  Time of notification:  0430  Critical value read back:yes  Nurse who received alert:  L. Vergel de Larkin Ina RN  MD notified (1st page):  Tylene Fantasia NP  Time of first page:  [phone]  MD notified (2nd page):  Time of second page:  Responding MD:  Tylene Fantasia NP  Time MD responded:  [phone]

## 2013-05-08 NOTE — Progress Notes (Signed)
[facility] Progress Note    Since last GI note: Sitting up eating BF.  No nausea or vomiting.  He tells me he does have post prandial chest discomfort and some dysphagia.   Objective: Vital signs in last 24 hours: Temp:  [98 F (36.7 C)-100.3 F (37.9 C)] 99.3 F (37.4 C) (04/01 0535) Pulse Rate:  [76-85] 79 (04/01 0535) Resp:  [16-17] 16 (04/01 0535) BP: (122-147)/(69-77) 122/69 mmHg (04/01 0535) SpO2:  [96 %-100 %] 96 % (04/01 0535) Weight:  [191 lb 8 oz (86.864 kg)] 191 lb 8 oz (86.864 kg) (04/01 0535) Last BM Date: 04/28/13 General: alert and oriented times 3 Heart: regular rate and rythm Abdomen: soft, non-tender, non-distended, normal bowel sounds   Lab Results:  Recent Labs  05/06/13 1730 05/07/13 0810 05/08/13 0341  WBC 4.2 3.4* 3.4*  HGB 14.3 13.1 11.8*  PLT 84* 74* 66*  MCV 92.9 92.9 94.7    Recent Labs  05/07/13 2020 05/08/13 0040 05/08/13 0341  NA 116* 120* 121*  K 4.6 4.4 4.5  CL 83* 86* 86*  CO2 22 22 24   GLUCOSE 123* 90 90  BUN 13 12 12   CREATININE 0.65 0.63 0.73  CALCIUM 9.8 10.2 10.5    Recent Labs  05/06/13 2100 05/07/13 0810 05/08/13 0341  PROT 6.5 6.0 5.6*  ALBUMIN 2.5* 2.3* 2.2*  AST 104* 83* 55*  ALT 55* 46 34  ALKPHOS 340* 317* 256*  BILITOT 0.9 0.7 0.8  Studies/Results: Dg Chest 2 View  05/06/2013   CLINICAL DATA:  Chest pain  EXAM: CHEST  2 VIEW  COMPARISON:  04/23/2013  FINDINGS: Artifact overlies the chest. Heart size is normal. Mediastinal shadows are normal. The lungs are clear. Ordinary degenerative changes affect the spine. No effusions. BB anterior to the sternum again noted.  IMPRESSION: No active cardiopulmonary disease.   Electronically Signed   By: Nelson Chimes M.D.   On: 05/06/2013 19:28     Medications: Scheduled Meds: . insulin aspart  0-15 Units Subcutaneous TID WC  . metoprolol succinate  100 mg Oral Daily  . pantoprazole  80 mg Oral Q1200  . sodium chloride  3 mL Intravenous Q12H    Continuous Infusions: . sodium chloride 150 mL/hr at 05/08/13 0819   PRN Meds:.ibuprofen, morphine injection, oxyCODONE    Assessment/Plan: 58 y.o. male with chest pain, hyponatremia  Electrolyte issues still predominate.    His UGI issues will require evaluation with EGD but his hyponatremia is most important problem here.  His Na level will need to be normalized, etiology of his severe hyponatremia understood before EGD. His wife tells me he's been drinking huge amounts of water at home for several months.    [name] Banister, MD  05/08/2013, 10:43 AM [facility] Pager [phone]

## 2013-05-08 NOTE — Progress Notes (Signed)
IR PA was asked to evaluate patient in ultrasound after he rolled from a lateral position onto his back and bumped the back of his head on the ultrasound machine, small amount of blood was noted on the pillow with a small 1cm skin break, no active bleeding visualized upon exam, no palpable hematoma felt. Patient was alert and oriented to self and date and reason for admission, however did state that he thought he was at [facility] and not [place]. I have called the patient's floor RN who states he has been disoriented this admission. He has been admitted for hyponatremia per chart review. I have spoke to Dr. Karleen Hampshire regarding this event and she will evaluate patient today after he returns back to his room. Ultrasound technologist was instructed to write up a healthcare safety zone portal.   Tsosie Billing PA-C Interventional Radiology  05/08/13  10:21 AM

## 2013-05-08 NOTE — Progress Notes (Signed)
CRITICAL VALUE ALERT  Critical value received:  Sodium level 120  Date of notification:  05/08/2013  Time of notification:  6387  Critical value read back:yes  Nurse who received alert:  Daleen Bo RN  MD notified (1st page):  Tylene Fantasia NP  Time of first page:  0155  MD notified (2nd page):  Time of second page:  Responding MD:  Tylene Fantasia NP  Time MD responded:  [phone]

## 2013-05-08 NOTE — Progress Notes (Signed)
CRITICAL VALUE ALERT  Critical value received:  Sodium level 118  Date of notification:  05/08/2013  Time of notification:  6378  Critical value read back:yes  Nurse who received alert:  Faith Rogue, RN   MD notified (1st page):  Dr. Karleen Hampshire   Time of first page: 1703  MD notified (2nd page):  Time of second page:  Responding MD:  Dr. Karleen Hampshire   Time MD responded:  [phone]

## 2013-05-09 ENCOUNTER — Telehealth: Payer: Self-pay

## 2013-05-09 LAB — SODIUM
SODIUM: 125 meq/L — AB (ref 137–147)
SODIUM: 126 meq/L — AB (ref 137–147)
Sodium: 125 meq/L — ABNORMAL LOW (ref 137–147)

## 2013-05-09 LAB — HEPATIC FUNCTION PANEL
ALK PHOS: 245 U/L — AB (ref 39–117)
ALT: 33 U/L (ref 0–53)
AST: 59 U/L — ABNORMAL HIGH (ref 0–37)
Albumin: 2.3 g/dL — ABNORMAL LOW (ref 3.5–5.2)
BILIRUBIN TOTAL: 0.8 mg/dL (ref 0.3–1.2)
Bilirubin, Direct: 0.3 mg/dL (ref 0.0–0.3)
Indirect Bilirubin: 0.5 mg/dL (ref 0.3–0.9)
Total Protein: 5.9 g/dL — ABNORMAL LOW (ref 6.0–8.3)

## 2013-05-09 LAB — GLUCOSE, CAPILLARY
Glucose-Capillary: 96 mg/dL (ref 70–99)
Glucose-Capillary: 104 mg/dL — ABNORMAL HIGH (ref 70–99)
Glucose-Capillary: 119 mg/dL — ABNORMAL HIGH (ref 70–99)
Glucose-Capillary: 108 mg/dL — ABNORMAL HIGH (ref 70–99)

## 2013-05-09 NOTE — Progress Notes (Signed)
Progress Note   Subjective  feels okay. Not having any significant chest pain like he was having prior to admission. Eating solids   Objective   Vital signs in last 24 hours: Temp:  [99 F (37.2 C)-100.1 F (37.8 C)] 99 F (37.2 C) (04/02 0636) Pulse Rate:  [79-87] 84 (04/02 0636) Resp:  [18] 18 (04/02 0636) BP: (111-139)/(69-82) 111/69 mmHg (04/02 0636) SpO2:  [96 %-97 %] 97 % (04/02 0636) Weight:  [184 lb 8 oz (83.689 kg)] 184 lb 8 oz (83.689 kg) (04/02 0636) Last BM Date: 06/03/13 General:    white male in NAD Heart:  Regular rate and rhythm; Abdomen:  Soft, nontender and nondistended. Normal bowel sounds. Extremities:  Without edema. Neurologic:  Alert and oriented,  grossly normal neurologically. Psych:  Cooperative. Normal mood and affect.  Lab Results:  Recent Labs  05/06/13 1730 05/07/13 0810 05/08/13 0341  WBC 4.2 3.4* 3.4*  HGB 14.3 13.1 11.8*  HCT 39.3 36.4* 33.7*  PLT 84* 74* 66*   BMET  Recent Labs  05/08/13 0040 05/08/13 0341 05/08/13 1610 05/09/13 0334  NA 120* 121* 118* 125*  K 4.4 4.5 4.4  --   CL 86* 86* 83*  --   CO2 22 24 23   --   GLUCOSE 90 90 110*  --   BUN 12 12 13   --   CREATININE 0.63 0.73 0.63  --   CALCIUM 10.2 10.5 10.3  --    LFT  Recent Labs  05/08/13 0341  PROT 5.6*  ALBUMIN 2.2*  AST 55*  ALT 34  ALKPHOS 256*  BILITOT 0.8    Studies/Results: US Abdomen Complete  05/08/2013   CLINICAL DATA:  Elevated LFTs  EXAM: ULTRASOUND ABDOMEN COMPLETE  COMPARISON:  None.  FINDINGS: Gallbladder:  No gallstones or wall thickening visualized. No sonographic Murphy sign noted.  Common bile duct:  Diameter: 3.4 mm  Liver:  Mildly increased in echogenicity likely related to fatty infiltration. There is an 8 mm relatively hypoechoic lesion within the right lobe of the liver. Some slight increased echogenicity is noted within. This is of uncertain etiology.  IVC:  No abnormality visualized.  Pancreas:  Visualized portion  unremarkable.  Spleen:  Size and appearance within normal limits.  Right Kidney:  Length: 12.1 cm. Echogenicity within normal limits. No mass or hydronephrosis visualized.  Left Kidney:  Length: 12.2 cm. Echogenicity within normal limits. No mass or hydronephrosis visualized.  Abdominal aorta:  No aneurysm visualized.  Other findings:  None.  IMPRESSION: Hypoechoic lesion within the liver as described. This is incompletely evaluated on this exam. Short-term followup may be helpful to assess for stability. Alternatively nonemergent MRI of the liver could be performed.   Electronically Signed   By: Inez Catalina M.D.   On: 05/08/2013 11:51     Assessment / Plan:   1. Chest pain radiating into and across upper shoulders. No significant chest pain like he was having in the month prior to this admission. Will need eventual EGD.   2. Intermittent solid food dysphagia,  chronic issue. He has been tolerating solids this admission without problems.   3. Recently abnormal LFTs, ultrasound done yesterday was basically unrevealing.    4. Severe hyponatremia. Etiology not completely understood but he was drinking massive amounts of water at home. Primary team correcting sodium which is up to 125 today. Getting tolvaptan  5. 86mm hypoechoic liver lesion on ultrasound. Likely incidental finding and unrelated to symptoms / abnormal LFTs.  May still need follow up imaging at some point.     LOS: 3 days   Tye Savoy  05/09/2013, 9:11 AM   ________________________________________________________________________  [facility] MD note:  I personally examined the patient, reviewed the data and agree with the assessment and plan described above.  He is eating normally today.  Given his other issues (unexplained significant hyponatremia, low platelets) I do not plan invasive GI procedures at this point. He did have an 50mm hypoechoic lesion in liver noted on [place] and I will order an MRI follow up as recommended by  radiologist.     Owens Loffler, MD [facility] Pager [phone]

## 2013-05-09 NOTE — Telephone Encounter (Signed)
Relevant patient education assigned to patient using Emmi. ° °

## 2013-05-09 NOTE — ED Provider Notes (Signed)
Medical screening examination/treatment/procedure(s) were conducted as a shared visit with non-physician practitioner(s) and myself.  I personally evaluated the patient during the encounter.   EKG Interpretation   Date/Time:  Monday May 06 2013 19:25:45 EDT Ventricular Rate:  93 PR Interval:  142 QRS Duration: 90 QT Interval:  337 QTC Calculation: 419 R Axis:   -65 Text Interpretation:  Sinus rhythm Left anterior fascicular block Low  voltage, precordial leads Abnormal R-wave progression, late transition  Baseline wander in lead(s) V1 ED PHYSICIAN INTERPRETATION AVAILABLE IN  [facility] Confirmed by TEST, Record ([id number]) on 05/08/2013 2:03:24 PM     Patient with hyponatremia on outside labs. Well appearing. Admit.  Jasper Riling. Alvino Chapel, MD 05/09/13 [phone]

## 2013-05-09 NOTE — Progress Notes (Signed)
[facility] PROGRESS NOTE  [name] [name] JYN:[id number] DOB: February 22, 1955 DOA: 05/06/2013 PCP: [name], MD  Assessment/Plan: Hyponatremia -Hypovolemic on admission, seems to be euvolemic at present. -Correcting at adequate pace with NS. Up to 126 from 116 on admission. -started him on tolvaptan yesterday.  -Also takes a large amount of free water daily. -TSH/cortisol ok. -Dc spironolactone indefinitely.  Hyperkalemia -Off lisinopril/spironolactone. -Corrected with IVF.  Hypercalcemia -Suspect related to dehydration. -recheck CMET in am.  Dysphagia -For EGD once electrolyte abnormalities corrected.  Orthostatic Hypotension -Resolved.  Transaminitis, mild -AST/Alk phos only. -?closet drinker. This could also explain his severe hyponatremia.  -Acute hepatitis panel negative. -Abdominal US negative.   DM II -Well controlled.  Code Status: Full Code Family Communication: Patient only discussed with wife over the phone.  Disposition Plan: Home when ready   Consultants:  GI   Antibiotics:  None   Subjective: No complaints, feels sleepy.   Objective: Filed Vitals:   05/08/13 1428 05/08/13 2100 05/09/13 0636 05/09/13 1343  BP: 132/82 139/74 111/69 120/69  Pulse: 87 79 84 83  Temp: 99.8 F (37.7 C) 100.1 F (37.8 C) 99 F (37.2 C) 98.9 F (37.2 C)  TempSrc: Oral Oral Oral Oral  Resp: $Remo'18 18 18 18  'fPvZq$ Height:      Weight:   83.689 kg (184 lb 8 oz)   SpO2: 97% 96% 97% 98%    Intake/Output Summary (Last 24 hours) at 05/09/13 1727 Last data filed at 05/09/13 1300  Gross per 24 hour  Intake   1440 ml  Output      0 ml  Net   1440 ml   Filed Weights   05/06/13 1937 05/08/13 0535 05/09/13 0636  Weight: 85.6 kg (188 lb 11.4 oz) 86.864 kg (191 lb 8 oz) 83.689 kg (184 lb 8 oz)    Exam:   General:  AA Ox3  Cardiovascular: RRR  Respiratory: CTA B  Abdomen: S/NT/ND/+BS  Extremities: no C/C/E   Neurologic:  Non-focal.  Data  Reviewed: Basic Metabolic Panel:  Recent Labs Lab 05/06/13 0821 05/06/13 1948 05/06/13 2100  05/07/13 0810 05/07/13 1355 05/07/13 2020 05/08/13 0040 05/08/13 0341 05/08/13 1610 05/09/13 0334 05/09/13 1257  NA 116*  --  114*  < > 119* 120* 116* 120* 121* 118* 125* 126*  K 5.4*  --  4.9  < > 5.3 4.9 4.6 4.4 4.5 4.4  --   --   CL 78*  --  79*  < > 82* 83* 83* 86* 86* 83*  --   --   CO2 27  --  22  < > $R'27 27 22 22 24 23  'lg$ --   --   GLUCOSE 113*  --  115*  < > 99 113* 123* 90 90 110*  --   --   BUN 12  --  10  < > $R'12 13 13 12 12 13  'UZ$ --   --   CREATININE 0.9  --  0.63  < > 0.73 0.77 0.65 0.63 0.73 0.63  --   --   CALCIUM 11.1*  --  10.7*  < > 10.5 9.8 9.8 10.2 10.5 10.3  --   --   MG  --   --  1.3*  --  2.2  --   --   --   --   --   --   --   PHOS  --  5.4*  --   --   --   --   --   --   --   --   --   --   < > =  values in this interval not displayed. Liver Function Tests:  Recent Labs Lab 05/06/13 0821 05/06/13 2100 05/07/13 0810 05/08/13 0341 05/09/13 0334  AST 78* 104* 83* 55* 59*  ALT 74* 55* 46 34 33  ALKPHOS 290* 340* 317* 256* 245*  BILITOT 0.9 0.9 0.7 0.8 0.8  PROT 7.4 6.5 6.0 5.6* 5.9*  ALBUMIN 3.1* 2.5* 2.3* 2.2* 2.3*   No results found for this basename: LIPASE, AMYLASE,  in the last 168 hours No results found for this basename: AMMONIA,  in the last 168 hours CBC:  Recent Labs Lab 05/06/13 1730 05/07/13 0810 05/08/13 0341  WBC 4.2 3.4* 3.4*  NEUTROABS 2.0  --   --   HGB 14.3 13.1 11.8*  HCT 39.3 36.4* 33.7*  MCV 92.9 92.9 94.7  PLT 84* 74* 66*   Cardiac Enzymes: No results found for this basename: CKTOTAL, CKMB, CKMBINDEX, TROPONINI,  in the last 168 hours BNP (last 3 results) No results found for this basename: PROBNP,  in the last 8760 hours CBG:  Recent Labs Lab 05/08/13 1705 05/08/13 2058 05/09/13 0746 05/09/13 1137 05/09/13 1708  GLUCAP 123* 107* 96 104* 119*    Recent Results (from the past 240 hour(s))  MRSA PCR SCREENING      Status: None   Collection Time    05/06/13  8:08 PM      Result Value Ref Range Status   MRSA by PCR NEGATIVE  NEGATIVE Final   Comment:            The GeneXpert MRSA Assay (FDA     approved for NASAL specimens     only), is one component of a     comprehensive MRSA colonization     surveillance program. It is not     intended to diagnose MRSA     infection nor to guide or     monitor treatment for     MRSA infections.     Studies: US Abdomen Complete  05/08/2013   CLINICAL DATA:  Elevated LFTs  EXAM: ULTRASOUND ABDOMEN COMPLETE  COMPARISON:  None.  FINDINGS: Gallbladder:  No gallstones or wall thickening visualized. No sonographic Murphy sign noted.  Common bile duct:  Diameter: 3.4 mm  Liver:  Mildly increased in echogenicity likely related to fatty infiltration. There is an 8 mm relatively hypoechoic lesion within the right lobe of the liver. Some slight increased echogenicity is noted within. This is of uncertain etiology.  IVC:  No abnormality visualized.  Pancreas:  Visualized portion unremarkable.  Spleen:  Size and appearance within normal limits.  Right Kidney:  Length: 12.1 cm. Echogenicity within normal limits. No mass or hydronephrosis visualized.  Left Kidney:  Length: 12.2 cm. Echogenicity within normal limits. No mass or hydronephrosis visualized.  Abdominal aorta:  No aneurysm visualized.  Other findings:  None.  IMPRESSION: Hypoechoic lesion within the liver as described. This is incompletely evaluated on this exam. Short-term followup may be helpful to assess for stability. Alternatively nonemergent MRI of the liver could be performed.   Electronically Signed   By: Inez Catalina M.D.   On: 05/08/2013 11:51    Scheduled Meds: . insulin aspart  0-15 Units Subcutaneous TID WC  . metoprolol succinate  100 mg Oral Daily  . pantoprazole  80 mg Oral Q1200  . sodium chloride  3 mL Intravenous Q12H  . tolvaptan  15 mg Oral Q24H   Continuous Infusions:    Principal Problem:    Hyponatremia Active Problems:  Type II or unspecified type diabetes mellitus with neurological manifestations, not stated as uncontrolled(250.60)   HYPERLIPIDEMIA   GOUT   HYPERTENSION   GERD   PEPTIC ULCER DISEASE   Diabetic neuropathy   Elevated LFTs   Other dysphagia   Chest pain   Hyperkalemia   Thrombocytopenia   Hypercalcemia   Orthostasis    Time spent: 45 minutes. Greater than 50% of this time was spent in direct contact with the patient coordinating care.    [facility] Pager [phone]  If 7PM-7AM, please contact night-coverage at [email or web address], password [facility] 05/09/2013, 5:27 PM  LOS: 3 days

## 2013-05-10 ENCOUNTER — Inpatient Hospital Stay (HOSPITAL_COMMUNITY): Payer: No Typology Code available for payment source

## 2013-05-10 ENCOUNTER — Encounter (HOSPITAL_COMMUNITY): Payer: Self-pay | Admitting: Radiology

## 2013-05-10 LAB — BASIC METABOLIC PANEL
BUN: 14 mg/dL (ref 6–23)
CO2: 30 mEq/L (ref 19–32)
CREATININE: 0.83 mg/dL (ref 0.50–1.35)
Calcium: 11.8 mg/dL — ABNORMAL HIGH (ref 8.4–10.5)
Chloride: 89 mEq/L — ABNORMAL LOW (ref 96–112)
GFR calc Af Amer: >90 mL/min (ref >90)
Glucose, Bld: 104 mg/dL — ABNORMAL HIGH (ref 70–99)
Potassium: 4.9 mEq/L (ref 3.7–5.3)
Sodium: 131 mEq/L — ABNORMAL LOW (ref 137–147)

## 2013-05-10 LAB — GLUCOSE, CAPILLARY
GLUCOSE-CAPILLARY: 112 mg/dL — AB (ref 70–99)
Glucose-Capillary: 103 mg/dL — ABNORMAL HIGH (ref 70–99)
Glucose-Capillary: 122 mg/dL — ABNORMAL HIGH (ref 70–99)
Glucose-Capillary: 110 mg/dL — ABNORMAL HIGH (ref 70–99)

## 2013-05-10 LAB — AFP TUMOR MARKER: AFP-Tumor Marker: 1.6 ng/mL (ref 0.0–8.0)

## 2013-05-10 MED ORDER — GADOBENATE DIMEGLUMINE 529 MG/ML IV SOLN
17.0000 mL | Freq: Once | INTRAVENOUS | Status: AC | PRN
  Administered 2013-05-10: 17 mL via INTRAVENOUS

## 2013-05-10 MED ORDER — IOHEXOL 300 MG/ML  SOLN
100.0000 mL | Freq: Once | INTRAMUSCULAR | Status: AC | PRN
  Administered 2013-05-10: 100 mL via INTRAVENOUS

## 2013-05-10 NOTE — Progress Notes (Signed)
[facility] Progress Note    Since last GI note: Reviewed MRI report.  Discussed with wife and patient. Chronic etoh abuse (6 beers per day) until about 4-5 weeks ago. Then crash diet (one small meal per day) while driking a lot of water "killing it" per patient.  Contniued on all his usual meds, diruetics.    Objective: Vital signs in last 24 hours: Temp:  [98.5 F (36.9 C)-98.9 F (37.2 C)] 98.5 F (36.9 C) (04/03 0506) Pulse Rate:  [82-84] 84 (04/03 0506) Resp:  [18-19] 19 (04/03 0506) BP: (117-133)/(69-74) 117/74 mmHg (04/03 0506) SpO2:  [94 %-98 %] 94 % (04/03 0506) Weight:  [184 lb 7.8 oz (83.684 kg)] 184 lb 7.8 oz (83.684 kg) (04/03 0506) Last BM Date: 05/09/13 General: alert and oriented times 3 Heart: regular rate and rythm Abdomen: soft, non-tender, non-distended, normal bowel sounds Ext: no edema in legs  Lab Results:  Recent Labs  05/08/13 0341  WBC 3.4*  HGB 11.8*  PLT 66*  MCV 94.7    Recent Labs  05/08/13 0341 05/08/13 1610  05/09/13 1257 05/09/13 2140 05/10/13 0345  NA 121* 118*  < > 126* 125* 131*  K 4.5 4.4  --   --   --  4.9  CL 86* 83*  --   --   --  89*  CO2 24 23  --   --   --  30  GLUCOSE 90 110*  --   --   --  104*  BUN 12 13  --   --   --  14  CREATININE 0.73 0.63  --   --   --  0.83  CALCIUM 10.5 10.3  --   --   --  11.8*  < > = values in this interval not displayed.  Recent Labs  05/08/13 0341 05/09/13 0334  PROT 5.6* 5.9*  ALBUMIN 2.2* 2.3*  AST 55* 59*  ALT 34 33  ALKPHOS 256* 245*  BILITOT 0.8 0.8  BILIDIR  --  0.3  IBILI  --  0.5   No results found for this basename: INR,  in the last 72 hours   Studies/Results: Mr Abdomen W Wo Contrast  05/10/2013   CLINICAL DATA:  Elevated liver function studies. Hypoechoic liver lesion on ultrasound.  EXAM: MRI ABDOMEN WITHOUT AND WITH CONTRAST  TECHNIQUE: Multiplanar multisequence MR imaging of the abdomen was performed both before and after the administration of  intravenous contrast.  CONTRAST:  56mL MULTIHANCE GADOBENATE DIMEGLUMINE 529 MG/ML IV SOLN  COMPARISON:  US ABDOMEN COMPLETE dated 05/08/2013; CT ANGIO CHEST W/CM &/OR WO/CM dated 04/23/2013  FINDINGS: Examination is mildly motion limited, especially on the dynamic post-contrast images. The liver contours are mildly irregular, and there is asymmetric enlargement of the caudate and medial segment of the left hepatic lobes. In addition, there is moderate bilateral gynecomastia. These findings suggest underlying cirrhosis. Best seen on the axial T2 FSE images are multiple less than 1 cm liver lesions with T2 hyperintensity. The largest measures 9 mm in the dome of the right lobe on image 12. The liver appears fairly homogeneous on T1 weighted images. Specifically, no T1 hyperintense lesion or significant steatosis is identified. Post-contrast, there are several small foci of nodular enhancement in the liver, suboptimally evaluated due to motion. In the portal phase, the hepatic parenchyma appears fairly homogeneous.  Several mildly prominent lymph nodes are present within the porta hepatis and portacaval space. On series 3, these measure 10 mm on image  27 and 12 mm on image 31. There is no retroperitoneal lymphadenopathy, ascites or peritoneal nodularity. The portal, superior mesenteric, splenic and hepatic veins are patent. No significant varices are identified. On the coronal images, there is questionable retraction and abnormal enhancement of the mesentery in the right lower quadrant. This area is not included on the axial images. There appear to be prominent right iliac nodes as well on the coronal images.  The gallbladder, biliary system and pancreas appear normal. The spleen, adrenal glands and kidneys appear normal. There is no hydronephrosis. There is a probable hemangioma within the L3 vertebral body.  IMPRESSION: 1. There are multiple small T2 hyperintense liver lesions, some of which demonstrate apparent  arterial phase enhancement. Dynamic images are limited by breathing artifact. In the setting of apparent underlying cirrhosis, these findings are consistent with regenerating and dysplastic nodules. 2. Questionable retraction and abnormal enhancement within the ileocolonic mesentery. Carcinoid tumor is a consideration and could explain the liver lesions. For initial evaluation, contrast-enhanced abdominal pelvic CT (or CT enterography) is recommended. 3. If CT does not show mesenteric lesion, MR follow-up of the liver lesions in 3-6 months would be recommended. Correlation with alpha-fetoprotein levels also recommended. 4. Nonspecific adenopathy in the porta hepatis, likely related to cirrhosis.   Electronically Signed   By: Camie Patience M.D.   On: 05/10/2013 08:59   US Abdomen Complete  05/08/2013   CLINICAL DATA:  Elevated LFTs  EXAM: ULTRASOUND ABDOMEN COMPLETE  COMPARISON:  None.  FINDINGS: Gallbladder:  No gallstones or wall thickening visualized. No sonographic Murphy sign noted.  Common bile duct:  Diameter: 3.4 mm  Liver:  Mildly increased in echogenicity likely related to fatty infiltration. There is an 8 mm relatively hypoechoic lesion within the right lobe of the liver. Some slight increased echogenicity is noted within. This is of uncertain etiology.  IVC:  No abnormality visualized.  Pancreas:  Visualized portion unremarkable.  Spleen:  Size and appearance within normal limits.  Right Kidney:  Length: 12.1 cm. Echogenicity within normal limits. No mass or hydronephrosis visualized.  Left Kidney:  Length: 12.2 cm. Echogenicity within normal limits. No mass or hydronephrosis visualized.  Abdominal aorta:  No aneurysm visualized.  Other findings:  None.  IMPRESSION: Hypoechoic lesion within the liver as described. This is incompletely evaluated on this exam. Short-term followup may be helpful to assess for stability. Alternatively nonemergent MRI of the liver could be performed.   Electronically Signed    By: Inez Catalina M.D.   On: 05/08/2013 11:51     Medications: Scheduled Meds: . insulin aspart  0-15 Units Subcutaneous TID WC  . metoprolol succinate  100 mg Oral Daily  . pantoprazole  80 mg Oral Q1200  . sodium chloride  3 mL Intravenous Q12H   Continuous Infusions:  PRN Meds:.ibuprofen, morphine injection, oxyCODONE    Assessment/Plan: 58 y.o. male with chest pains, intermittent dysphagia, ? Cirrhosis on recent MRI  MRI findings are interesting.  Suggest he MAY have underlying cirrhosis. Will check coags.,   He does tend to abuse etoh and LFTs have been abnormal for a long time.  I will order the CT scan suggested by radiology to check for carcinoid tumor in abd.    Still unusual that his platelets have dropped (normal 2 weeks ago, now 60K).  Hyponatremia etiology still not clear.  Perhaps beer potomania, ?water intoxifcation (admits to drink large amounts), ?diabetes insipidus.    His sodium is 131 now, I think it is safe  to proceed with EGD tomorrow.    Milus Banister, MD  05/10/2013, 10:14 AM [facility] Pager [phone]

## 2013-05-10 NOTE — Progress Notes (Addendum)
[facility] PROGRESS NOTE  [name] [name] ZOX:[id number] DOB: 06/05/1955 DOA: 05/06/2013 PCP: [name], MD  Assessment/Plan: Hyponatremia -Hypovolemic on admission, seems to be euvolemic at present. -Correcting at adequate pace with NS. Up to 131 from 116 on admission. -started him on tolvaptan on 4/1 and discontinued on 4/3.  -Also takes a large amount of free water daily. -TSH/cortisol ok. -Dc spironolactone indefinitely.  Hyperkalemia -Off lisinopril/spironolactone. -Corrected with IVF.  Hypercalcemia -Suspect related to dehydration. -recheck CMET in am.  Dysphagia -For EGD once electrolyte abnormalities corrected.  Orthostatic Hypotension -Resolved.  Transaminitis, mild -AST/Alk phos only. -?closet drinker. This could also explain his severe hyponatremia.  -Acute hepatitis panel negative. -Abdominal US negative.    Abnormal MRI of the abdomen: revealing multiple liver lesions and abnormal area of mesentery. CT abd and pelvis ordered for further evaluation.   DM II -Well controlled.  Hypercalcemia: Unclear etiology. rpeat level in am.   Code Status: Full Code Family Communication: Patient only discussed with wife over the phone.  Disposition Plan: Home when ready   Consultants:  GI   Antibiotics:  None   Subjective: No complaints, feels sleepy.   Objective: Filed Vitals:   05/09/13 1343 05/09/13 2220 05/10/13 0506 05/10/13 1411  BP: 120/69 133/70 117/74 112/72  Pulse: 83 82 84 78  Temp: 98.9 F (37.2 C) 98.9 F (37.2 C) 98.5 F (36.9 C) 98.7 F (37.1 C)  TempSrc: Oral Oral Oral Oral  Resp: _0 Height:      Weight:   83.684 kg (184 lb 7.8 oz)   SpO2: 98% 98% 94% 99%    Intake/Output Summary (Last 24 hours) at 05/10/13 1745 Last data filed at 05/10/13 1500  Gross per 24 hour  Intake   2400 ml  Output      0 ml  Net   2400 ml   Filed Weights   05/08/13 0535 05/09/13 0636 05/10/13 0506  Weight: 86.864 kg (191  lb 8 oz) 83.689 kg (184 lb 8 oz) 83.684 kg (184 lb 7.8 oz)    Exam:   General:  AA Ox3  Cardiovascular: RRR  Respiratory: CTA B  Abdomen: S/NT/ND/+BS  Extremities: no C/C/E   Neurologic:  Non-focal.  Data Reviewed: Basic Metabolic Panel:  Recent Labs Lab 05/06/13 0821 05/06/13 1948 05/06/13 2100  05/07/13 0810  05/07/13 2020 05/08/13 0040 05/08/13 0341 05/08/13 1610 05/09/13 0334 05/09/13 1257 05/09/13 2140 05/10/13 0345  NA 116*  --  114*  < > 119*  < > 116* 120* 121* 118* 125* 126* 125* 131*  K 5.4*  --  4.9  < > 5.3  < > 4.6 4.4 4.5 4.4  --   --   --  4.9  CL 78*  --  79*  < > 82*  < > 83* 86* 86* 83*  --   --   --  89*  CO2 27  --  22  < > 27  < > _1 --   --   --  30  GLUCOSE 113*  --  115*  < > 99  < > 123* 90 90 110*  --   --   --  104*  BUN 12  --  10  < > 12  < > _2 --   --   --  14  CREATININE 0.9  --  0.63  < > 0.73  < > 0.65 0.63 0.73 0.63  --   --   --  0.83  CALCIUM 11.1*  --  10.7*  < > 10.5  < > 9.8 10.2 10.5 10.3  --   --   --  11.8*  MG  --   --  1.3*  --  2.2  --   --   --   --   --   --   --   --   --   PHOS  --  5.4*  --   --   --   --   --   --   --   --   --   --   --   --   < > = values in this interval not displayed. Liver Function Tests:  Recent Labs Lab 05/06/13 0821 05/06/13 2100 05/07/13 0810 05/08/13 0341 05/09/13 0334  AST 78* 104* 83* 55* 59*  ALT 74* 55* 46 34 33  ALKPHOS 290* 340* 317* 256* 245*  BILITOT 0.9 0.9 0.7 0.8 0.8  PROT 7.4 6.5 6.0 5.6* 5.9*  ALBUMIN 3.1* 2.5* 2.3* 2.2* 2.3*   No results found for this basename: LIPASE, AMYLASE,  in the last 168 hours No results found for this basename: AMMONIA,  in the last 168 hours CBC:  Recent Labs Lab 05/06/13 1730 05/07/13 0810 05/08/13 0341  WBC 4.2 3.4* 3.4*  NEUTROABS 2.0  --   --   HGB 14.3 13.1 11.8*  HCT 39.3 36.4* 33.7*  MCV 92.9 92.9 94.7  PLT 84* 74* 66*   Cardiac Enzymes: No results found for this basename: CKTOTAL, CKMB,  CKMBINDEX, TROPONINI,  in the last 168 hours BNP (last 3 results) No results found for this basename: PROBNP,  in the last 8760 hours CBG:  Recent Labs Lab 05/09/13 1708 05/09/13 2217 05/10/13 0810 05/10/13 1151 05/10/13 1629  GLUCAP 119* 108* 103* 122* 110*    Recent Results (from the past 240 hour(s))  MRSA PCR SCREENING     Status: None   Collection Time    05/06/13  8:08 PM      Result Value Ref Range Status   MRSA by PCR NEGATIVE  NEGATIVE Final   Comment:            The GeneXpert MRSA Assay (FDA     approved for NASAL specimens     only), is one component of a     comprehensive MRSA colonization     surveillance program. It is not     intended to diagnose MRSA     infection nor to guide or     monitor treatment for     MRSA infections.     Studies: Mr Abdomen W Wo Contrast  05/10/2013   CLINICAL DATA:  Elevated liver function studies. Hypoechoic liver lesion on ultrasound.  EXAM: MRI ABDOMEN WITHOUT AND WITH CONTRAST  TECHNIQUE: Multiplanar multisequence MR imaging of the abdomen was performed both before and after the administration of intravenous contrast.  CONTRAST:  82m MULTIHANCE GADOBENATE DIMEGLUMINE 529 MG/ML IV SOLN  COMPARISON:  [place] dated 05/08/2013; CT ANGIO CHEST W/CM &/OR WO/CM dated 04/23/2013  FINDINGS: Examination is mildly motion limited, especially on the dynamic post-contrast images. The liver contours are mildly irregular, and there is asymmetric enlargement of the caudate and medial segment of the left hepatic lobes. In addition, there is moderate bilateral gynecomastia. These findings suggest underlying cirrhosis. Best seen on the axial T2 FSE images are multiple less than 1 cm liver lesions with T2 hyperintensity. The largest measures 9 mm in  the dome of the right lobe on image 12. The liver appears fairly homogeneous on T1 weighted images. Specifically, no T1 hyperintense lesion or significant steatosis is identified. Post-contrast, there  are several small foci of nodular enhancement in the liver, suboptimally evaluated due to motion. In the portal phase, the hepatic parenchyma appears fairly homogeneous.  Several mildly prominent lymph nodes are present within the porta hepatis and portacaval space. On series 3, these measure 10 mm on image 27 and 12 mm on image 31. There is no retroperitoneal lymphadenopathy, ascites or peritoneal nodularity. The portal, superior mesenteric, splenic and hepatic veins are patent. No significant varices are identified. On the coronal images, there is questionable retraction and abnormal enhancement of the mesentery in the right lower quadrant. This area is not included on the axial images. There appear to be prominent right iliac nodes as well on the coronal images.  The gallbladder, biliary system and pancreas appear normal. The spleen, adrenal glands and kidneys appear normal. There is no hydronephrosis. There is a probable hemangioma within the L3 vertebral body.  IMPRESSION: 1. There are multiple small T2 hyperintense liver lesions, some of which demonstrate apparent arterial phase enhancement. Dynamic images are limited by breathing artifact. In the setting of apparent underlying cirrhosis, these findings are consistent with regenerating and dysplastic nodules. 2. Questionable retraction and abnormal enhancement within the ileocolonic mesentery. Carcinoid tumor is a consideration and could explain the liver lesions. For initial evaluation, contrast-enhanced abdominal pelvic CT (or CT enterography) is recommended. 3. If CT does not show mesenteric lesion, MR follow-up of the liver lesions in 3-6 months would be recommended. Correlation with alpha-fetoprotein levels also recommended. 4. Nonspecific adenopathy in the porta hepatis, likely related to cirrhosis.   Electronically Signed   By: Camie Patience M.D.   On: 05/10/2013 08:59    Scheduled Meds: . insulin aspart  0-15 Units Subcutaneous TID WC  . metoprolol  succinate  100 mg Oral Daily  . pantoprazole  80 mg Oral Q1200  . sodium chloride  3 mL Intravenous Q12H   Continuous Infusions:    Principal Problem:   Hyponatremia Active Problems:   Type II or unspecified type diabetes mellitus with neurological manifestations, not stated as uncontrolled(250.60)   HYPERLIPIDEMIA   GOUT   HYPERTENSION   GERD   PEPTIC ULCER DISEASE   Diabetic neuropathy   Elevated LFTs   Other dysphagia   Chest pain   Hyperkalemia   Thrombocytopenia   Hypercalcemia   Orthostasis    Time spent: 45 minutes. Greater than 50% of this time was spent in direct contact with the patient coordinating care.    [facility] Pager [phone]  If 7PM-7AM, please contact night-coverage at [email or web address], password [facility] 05/10/2013, 5:45 PM  LOS: 4 days

## 2013-05-11 ENCOUNTER — Encounter (HOSPITAL_COMMUNITY): Payer: Self-pay | Admitting: Gastroenterology

## 2013-05-11 ENCOUNTER — Encounter (HOSPITAL_COMMUNITY)
Admission: AD | Disposition: A | Discharge status: Home / Self Care | Payer: Self-pay | Source: Home / Self Care | Attending: Internal Medicine

## 2013-05-11 DIAGNOSIS — F1021 Alcohol dependence, in remission: Secondary | ICD-10-CM

## 2013-05-11 DIAGNOSIS — K7460 Unspecified cirrhosis of liver: Secondary | ICD-10-CM

## 2013-05-11 DIAGNOSIS — E878 Other disorders of electrolyte and fluid balance, not elsewhere classified: Secondary | ICD-10-CM

## 2013-05-11 DIAGNOSIS — D375 Neoplasm of uncertain behavior of rectum: Secondary | ICD-10-CM

## 2013-05-11 DIAGNOSIS — K7689 Other specified diseases of liver: Secondary | ICD-10-CM

## 2013-05-11 DIAGNOSIS — D378 Neoplasm of uncertain behavior of other specified digestive organs: Secondary | ICD-10-CM

## 2013-05-11 DIAGNOSIS — D371 Neoplasm of uncertain behavior of stomach: Secondary | ICD-10-CM

## 2013-05-11 HISTORY — PX: ESOPHAGOGASTRODUODENOSCOPY: SHX5428

## 2013-05-11 LAB — PROTIME-INR
INR: 1.31 (ref 0.00–1.49)
PROTHROMBIN TIME: 16 s — AB (ref 11.6–15.2)

## 2013-05-11 LAB — DIC (DISSEMINATED INTRAVASCULAR COAGULATION)PANEL
D-Dimer, Quant: >20 ug/mL-FEU — ABNORMAL HIGH (ref 0.00–0.48)
Fibrinogen: >800 mg/dL — ABNORMAL HIGH (ref 204–475)
INR: 1.29 (ref 0.00–1.49)
Platelets: 60 10*3/uL — ABNORMAL LOW (ref 150–400)
Prothrombin Time: 15.8 seconds — ABNORMAL HIGH (ref 11.6–15.2)
aPTT: 32 seconds (ref 24–37)

## 2013-05-11 LAB — CBC
HCT: 35.9 % — ABNORMAL LOW (ref 39.0–52.0)
HEMOGLOBIN: 12.2 g/dL — AB (ref 13.0–17.0)
MCH: 33 pg (ref 26.0–34.0)
MCHC: 34 g/dL (ref 30.0–36.0)
MCV: 97 fL (ref 78.0–100.0)
Platelets: 61 10*3/uL — ABNORMAL LOW (ref 150–400)
RBC: 3.7 MIL/uL — AB (ref 4.22–5.81)
RDW: 12.7 % (ref 11.5–15.5)
WBC: 4 10*3/uL (ref 4.0–10.5)

## 2013-05-11 LAB — BASIC METABOLIC PANEL
BUN: 14 mg/dL (ref 6–23)
CO2: 27 mEq/L (ref 19–32)
Calcium: 11.5 mg/dL — ABNORMAL HIGH (ref 8.4–10.5)
Chloride: 87 mEq/L — ABNORMAL LOW (ref 96–112)
Creatinine, Ser: 0.71 mg/dL (ref 0.50–1.35)
GFR calc non Af Amer: >90 mL/min (ref >90)
Glucose, Bld: 102 mg/dL — ABNORMAL HIGH (ref 70–99)
POTASSIUM: 4.4 meq/L (ref 3.7–5.3)
Sodium: 127 mEq/L — ABNORMAL LOW (ref 137–147)

## 2013-05-11 LAB — GLUCOSE, CAPILLARY
GLUCOSE-CAPILLARY: 112 mg/dL — AB (ref 70–99)
GLUCOSE-CAPILLARY: 120 mg/dL — AB (ref 70–99)
Glucose-Capillary: 83 mg/dL (ref 70–99)
Glucose-Capillary: 113 mg/dL — ABNORMAL HIGH (ref 70–99)

## 2013-05-11 LAB — DIC (DISSEMINATED INTRAVASCULAR COAGULATION) PANEL: Smear Review: NONE SEEN

## 2013-05-11 SURGERY — EGD (ESOPHAGOGASTRODUODENOSCOPY)
Anesthesia: Moderate Sedation

## 2013-05-11 MED ORDER — MIDAZOLAM HCL 10 MG/2ML IJ SOLN
INTRAMUSCULAR | Status: AC
Start: 2013-05-11 — End: 2013-05-11
  Filled 2013-05-11: qty 2

## 2013-05-11 MED ORDER — FENTANYL CITRATE 0.05 MG/ML IJ SOLN
INTRAMUSCULAR | Status: AC
  Filled 2013-05-11: qty 2

## 2013-05-11 MED ORDER — FENTANYL CITRATE 0.05 MG/ML IJ SOLN
INTRAMUSCULAR | Status: DC | PRN
  Administered 2013-05-11: 25 ug via INTRAVENOUS

## 2013-05-11 MED ORDER — MIDAZOLAM HCL 10 MG/2ML IJ SOLN
INTRAMUSCULAR | Status: DC | PRN
  Administered 2013-05-11: 2 mg via INTRAVENOUS

## 2013-05-11 MED ORDER — BUTAMBEN-TETRACAINE-BENZOCAINE 2-2-14 % EX AERO
INHALATION_SPRAY | CUTANEOUS | Status: DC | PRN
  Administered 2013-05-11: 2 via TOPICAL

## 2013-05-11 MED ORDER — SODIUM CHLORIDE 0.9 % IV SOLN: INTRAVENOUS | Status: DC

## 2013-05-11 NOTE — Interval H&P Note (Signed)
History and Physical Interval Note:  05/11/2013 7:42 AM  [name] [name]  has presented today for surgery, with the diagnosis of dysphagia, chest pain, also ?varices  The various methods of treatment have been discussed with the patient and family. After consideration of risks, benefits and other options for treatment, the patient has consented to  Procedure(s): ESOPHAGOGASTRODUODENOSCOPY (EGD) (N/A) as a surgical intervention .  The patient's history has been reviewed, patient examined, no change in status, stable for surgery.  I have reviewed the patient's chart and labs.  Questions were answered to the patient's satisfaction.     Milus Banister

## 2013-05-11 NOTE — H&P (View-Only) (Signed)
[facility] Progress Note    Since last GI note: Reviewed MRI report.  Discussed with wife and patient. Chronic etoh abuse (6 beers per day) until about 4-5 weeks ago. Then crash diet (one small meal per day) while driking a lot of water "killing it" per patient.  Contniued on all his usual meds, diruetics.    Objective: Vital signs in last 24 hours: Temp:  [98.5 F (36.9 C)-98.9 F (37.2 C)] 98.5 F (36.9 C) (04/03 0506) Pulse Rate:  [82-84] 84 (04/03 0506) Resp:  [18-19] 19 (04/03 0506) BP: (117-133)/(69-74) 117/74 mmHg (04/03 0506) SpO2:  [94 %-98 %] 94 % (04/03 0506) Weight:  [184 lb 7.8 oz (83.684 kg)] 184 lb 7.8 oz (83.684 kg) (04/03 0506) Last BM Date: 05/09/13 General: alert and oriented times 3 Heart: regular rate and rythm Abdomen: soft, non-tender, non-distended, normal bowel sounds Ext: no edema in legs  Lab Results:  Recent Labs  05/08/13 0341  WBC 3.4*  HGB 11.8*  PLT 66*  MCV 94.7    Recent Labs  05/08/13 0341 05/08/13 1610  05/09/13 1257 05/09/13 2140 05/10/13 0345  NA 121* 118*  < > 126* 125* 131*  K 4.5 4.4  --   --   --  4.9  CL 86* 83*  --   --   --  89*  CO2 24 23  --   --   --  30  GLUCOSE 90 110*  --   --   --  104*  BUN 12 13  --   --   --  14  CREATININE 0.73 0.63  --   --   --  0.83  CALCIUM 10.5 10.3  --   --   --  11.8*  < > = values in this interval not displayed.  Recent Labs  05/08/13 0341 05/09/13 0334  PROT 5.6* 5.9*  ALBUMIN 2.2* 2.3*  AST 55* 59*  ALT 34 33  ALKPHOS 256* 245*  BILITOT 0.8 0.8  BILIDIR  --  0.3  IBILI  --  0.5   No results found for this basename: INR,  in the last 72 hours   Studies/Results: Mr Abdomen W Wo Contrast  05/10/2013   CLINICAL DATA:  Elevated liver function studies. Hypoechoic liver lesion on ultrasound.  EXAM: MRI ABDOMEN WITHOUT AND WITH CONTRAST  TECHNIQUE: Multiplanar multisequence MR imaging of the abdomen was performed both before and after the administration of  intravenous contrast.  CONTRAST:  17mL MULTIHANCE GADOBENATE DIMEGLUMINE 529 MG/ML IV SOLN  COMPARISON:  US ABDOMEN COMPLETE dated 05/08/2013; CT ANGIO CHEST W/CM &/OR WO/CM dated 04/23/2013  FINDINGS: Examination is mildly motion limited, especially on the dynamic post-contrast images. The liver contours are mildly irregular, and there is asymmetric enlargement of the caudate and medial segment of the left hepatic lobes. In addition, there is moderate bilateral gynecomastia. These findings suggest underlying cirrhosis. Best seen on the axial T2 FSE images are multiple less than 1 cm liver lesions with T2 hyperintensity. The largest measures 9 mm in the dome of the right lobe on image 12. The liver appears fairly homogeneous on T1 weighted images. Specifically, no T1 hyperintense lesion or significant steatosis is identified. Post-contrast, there are several small foci of nodular enhancement in the liver, suboptimally evaluated due to motion. In the portal phase, the hepatic parenchyma appears fairly homogeneous.  Several mildly prominent lymph nodes are present within the porta hepatis and portacaval space. On series 3, these measure 10 mm on image   27 and 12 mm on image 31. There is no retroperitoneal lymphadenopathy, ascites or peritoneal nodularity. The portal, superior mesenteric, splenic and hepatic veins are patent. No significant varices are identified. On the coronal images, there is questionable retraction and abnormal enhancement of the mesentery in the right lower quadrant. This area is not included on the axial images. There appear to be prominent right iliac nodes as well on the coronal images.  The gallbladder, biliary system and pancreas appear normal. The spleen, adrenal glands and kidneys appear normal. There is no hydronephrosis. There is a probable hemangioma within the L3 vertebral body.  IMPRESSION: 1. There are multiple small T2 hyperintense liver lesions, some of which demonstrate apparent  arterial phase enhancement. Dynamic images are limited by breathing artifact. In the setting of apparent underlying cirrhosis, these findings are consistent with regenerating and dysplastic nodules. 2. Questionable retraction and abnormal enhancement within the ileocolonic mesentery. Carcinoid tumor is a consideration and could explain the liver lesions. For initial evaluation, contrast-enhanced abdominal pelvic CT (or CT enterography) is recommended. 3. If CT does not show mesenteric lesion, MR follow-up of the liver lesions in 3-6 months would be recommended. Correlation with alpha-fetoprotein levels also recommended. 4. Nonspecific adenopathy in the porta hepatis, likely related to cirrhosis.   Electronically Signed   By: Camie Patience M.D.   On: 05/10/2013 08:59   US Abdomen Complete  05/08/2013   CLINICAL DATA:  Elevated LFTs  EXAM: ULTRASOUND ABDOMEN COMPLETE  COMPARISON:  None.  FINDINGS: Gallbladder:  No gallstones or wall thickening visualized. No sonographic Murphy sign noted.  Common bile duct:  Diameter: 3.4 mm  Liver:  Mildly increased in echogenicity likely related to fatty infiltration. There is an 8 mm relatively hypoechoic lesion within the right lobe of the liver. Some slight increased echogenicity is noted within. This is of uncertain etiology.  IVC:  No abnormality visualized.  Pancreas:  Visualized portion unremarkable.  Spleen:  Size and appearance within normal limits.  Right Kidney:  Length: 12.1 cm. Echogenicity within normal limits. No mass or hydronephrosis visualized.  Left Kidney:  Length: 12.2 cm. Echogenicity within normal limits. No mass or hydronephrosis visualized.  Abdominal aorta:  No aneurysm visualized.  Other findings:  None.  IMPRESSION: Hypoechoic lesion within the liver as described. This is incompletely evaluated on this exam. Short-term followup may be helpful to assess for stability. Alternatively nonemergent MRI of the liver could be performed.   Electronically Signed    By: Inez Catalina M.D.   On: 05/08/2013 11:51     Medications: Scheduled Meds: . insulin aspart  0-15 Units Subcutaneous TID WC  . metoprolol succinate  100 mg Oral Daily  . pantoprazole  80 mg Oral Q1200  . sodium chloride  3 mL Intravenous Q12H   Continuous Infusions:  PRN Meds:.ibuprofen, morphine injection, oxyCODONE    Assessment/Plan: 58 y.o. male with chest pains, intermittent dysphagia, ? Cirrhosis on recent MRI  MRI findings are interesting.  Suggest he MAY have underlying cirrhosis. Will check coags.,   He does tend to abuse etoh and LFTs have been abnormal for a long time.  I will order the CT scan suggested by radiology to check for carcinoid tumor in abd.    Still unusual that his platelets have dropped (normal 2 weeks ago, now 60K).  Hyponatremia etiology still not clear.  Perhaps beer potomania, ?water intoxifcation (admits to drink large amounts), ?diabetes insipidus.    His sodium is 131 now, I think it is safe  to proceed with EGD tomorrow.    Jacobs, Daniel P, MD  05/10/2013, 10:14 AM [facility] Pager [phone]     

## 2013-05-11 NOTE — Progress Notes (Signed)
[facility] PROGRESS NOTE  [name] [name] UKG:[id number] DOB: [birth year] DOA: 05/06/2013 PCP: [name], MD  Assessment/Plan: Hyponatremia -Hypovolemic on admission, seems to be euvolemic at present. -Correcting at adequate pace with NS. SODIUM down to 127 from 131 after tolvaptan was stopped.  -started him on tolvaptan on 4/1 and discontinued on 4/3.  -Also takes a large amount of free water daily. -TSH/cortisol ok. -Dc spironolactone indefinitely.  Hyperkalemia -Off lisinopril/spironolactone. -Corrected with IVF.  Hypercalcemia -Suspect related to dehydration. -recheck CMET in am.  Dysphagia -For EGD once electrolyte abnormalities corrected.  Orthostatic Hypotension -Resolved.  Transaminitis, mild -AST/Alk phos only. -?closet drinker. This could also explain his severe hyponatremia.  -Acute hepatitis panel negative. -Abdominal US negative.    Abnormal MRI of the abdomen: revealing multiple liver lesions and abnormal area of mesentery. CT abd and pelvis ordered for further evaluation.  CT showed inguiinal lymphadenopathy, and IR consulted for biopsy.   Thrombocytopenia:  - unclear etiology.  - DIC panel ordered and hematology consulted for further input.   DM II -Well controlled.  Hypercalcemia: Unclear etiology. rpeat level in am is persistently high. He is asymptomatic. Wills tart him on gentle hydration.   Code Status: Full Code Family Communication: Patient only discussed with wife over the phone.  Disposition Plan: Home when ready   Consultants:  GI  Ir   Hematology/oncology    Antibiotics:  None   Subjective: No complaints, denies any sob, cp, or abdominal pain.    Objective: Filed Vitals:   05/11/13 0803 05/11/13 0805 05/11/13 0815 05/11/13 0825  BP:  116/72 116/72 106/67  Pulse:  78 77 76  Temp: 97.7 F (36.5 C)     TempSrc: Axillary     Resp:  $Remo'16 9 16  'wkQiD$ Height:      Weight:      SpO2:  99% 99% 93%    Intake/Output  Summary (Last 24 hours) at 05/11/13 1348 Last data filed at 05/10/13 2300  Gross per 24 hour  Intake   1080 ml  Output      0 ml  Net   1080 ml   Filed Weights   05/09/13 0636 05/10/13 0506 05/11/13 0447  Weight: 83.689 kg (184 lb 8 oz) 83.684 kg (184 lb 7.8 oz) 81.693 kg (180 lb 1.6 oz)    Exam:   General:  AA Ox3  Cardiovascular: RRR  Respiratory: CTA B  Abdomen: S/NT/ND/+BS  Extremities: no C/C/E   Neurologic:  Non-focal.  Data Reviewed: Basic Metabolic Panel:  Recent Labs Lab 05/06/13 0821 05/06/13 1948 05/06/13 2100  05/07/13 0810  05/08/13 0040 05/08/13 0341 05/08/13 1610 05/09/13 0334 05/09/13 1257 05/09/13 2140 05/10/13 0345 05/11/13 0505  NA 116*  --  114*  < > 119*  < > 120* 121* 118* 125* 126* 125* 131* 127*  K 5.4*  --  4.9  < > 5.3  < > 4.4 4.5 4.4  --   --   --  4.9 4.4  CL 78*  --  79*  < > 82*  < > 86* 86* 83*  --   --   --  89* 87*  CO2 27  --  22  < > 27  < > $R'22 24 23  'Om$ --   --   --  30 27  GLUCOSE 113*  --  115*  < > 99  < > 90 90 110*  --   --   --  104* 102*  BUN 12  --  10  < > 12  < > $R'12 12 13  'uq$ --   --   --  14 14  CREATININE 0.9  --  0.63  < > 0.73  < > 0.63 0.73 0.63  --   --   --  0.83 0.71  CALCIUM 11.1*  --  10.7*  < > 10.5  < > 10.2 10.5 10.3  --   --   --  11.8* 11.5*  MG  --   --  1.3*  --  2.2  --   --   --   --   --   --   --   --   --   PHOS  --  5.4*  --   --   --   --   --   --   --   --   --   --   --   --   < > = values in this interval not displayed. Liver Function Tests:  Recent Labs Lab 05/06/13 0821 05/06/13 2100 05/07/13 0810 05/08/13 0341 05/09/13 0334  AST 78* 104* 83* 55* 59*  ALT 74* 55* 46 34 33  ALKPHOS 290* 340* 317* 256* 245*  BILITOT 0.9 0.9 0.7 0.8 0.8  PROT 7.4 6.5 6.0 5.6* 5.9*  ALBUMIN 3.1* 2.5* 2.3* 2.2* 2.3*   No results found for this basename: LIPASE, AMYLASE,  in the last 168 hours No results found for this basename: AMMONIA,  in the last 168 hours CBC:  Recent Labs Lab  05/06/13 1730 05/07/13 0810 05/08/13 0341 05/11/13 0505 05/11/13 1055  WBC 4.2 3.4* 3.4* 4.0  --   NEUTROABS 2.0  --   --   --   --   HGB 14.3 13.1 11.8* 12.2*  --   HCT 39.3 36.4* 33.7* 35.9*  --   MCV 92.9 92.9 94.7 97.0  --   PLT 84* 74* 66* 61* 60*   Cardiac Enzymes: No results found for this basename: CKTOTAL, CKMB, CKMBINDEX, TROPONINI,  in the last 168 hours BNP (last 3 results) No results found for this basename: PROBNP,  in the last 8760 hours CBG:  Recent Labs Lab 05/10/13 1151 05/10/13 1629 05/10/13 2117 05/11/13 0813 05/11/13 1228  GLUCAP 122* 110* 112* 83 113*    Recent Results (from the past 240 hour(s))  MRSA PCR SCREENING     Status: None   Collection Time    05/06/13  8:08 PM      Result Value Ref Range Status   MRSA by PCR NEGATIVE  NEGATIVE Final   Comment:            The GeneXpert MRSA Assay (FDA     approved for NASAL specimens     only), is one component of a     comprehensive MRSA colonization     surveillance program. It is not     intended to diagnose MRSA     infection nor to guide or     monitor treatment for     MRSA infections.     Studies: Mr Abdomen W Wo Contrast  05/10/2013   CLINICAL DATA:  Elevated liver function studies. Hypoechoic liver lesion on ultrasound.  EXAM: MRI ABDOMEN WITHOUT AND WITH CONTRAST  TECHNIQUE: Multiplanar multisequence MR imaging of the abdomen was performed both before and after the administration of intravenous contrast.  CONTRAST:  77mL MULTIHANCE GADOBENATE DIMEGLUMINE 529 MG/ML IV SOLN  COMPARISON:  US ABDOMEN COMPLETE dated 05/08/2013; CT ANGIO CHEST  W/CM &/OR WO/CM dated 04/23/2013  FINDINGS: Examination is mildly motion limited, especially on the dynamic post-contrast images. The liver contours are mildly irregular, and there is asymmetric enlargement of the caudate and medial segment of the left hepatic lobes. In addition, there is moderate bilateral gynecomastia. These findings suggest underlying  cirrhosis. Best seen on the axial T2 FSE images are multiple less than 1 cm liver lesions with T2 hyperintensity. The largest measures 9 mm in the dome of the right lobe on image 12. The liver appears fairly homogeneous on T1 weighted images. Specifically, no T1 hyperintense lesion or significant steatosis is identified. Post-contrast, there are several small foci of nodular enhancement in the liver, suboptimally evaluated due to motion. In the portal phase, the hepatic parenchyma appears fairly homogeneous.  Several mildly prominent lymph nodes are present within the porta hepatis and portacaval space. On series 3, these measure 10 mm on image 27 and 12 mm on image 31. There is no retroperitoneal lymphadenopathy, ascites or peritoneal nodularity. The portal, superior mesenteric, splenic and hepatic veins are patent. No significant varices are identified. On the coronal images, there is questionable retraction and abnormal enhancement of the mesentery in the right lower quadrant. This area is not included on the axial images. There appear to be prominent right iliac nodes as well on the coronal images.  The gallbladder, biliary system and pancreas appear normal. The spleen, adrenal glands and kidneys appear normal. There is no hydronephrosis. There is a probable hemangioma within the L3 vertebral body.  IMPRESSION: 1. There are multiple small T2 hyperintense liver lesions, some of which demonstrate apparent arterial phase enhancement. Dynamic images are limited by breathing artifact. In the setting of apparent underlying cirrhosis, these findings are consistent with regenerating and dysplastic nodules. 2. Questionable retraction and abnormal enhancement within the ileocolonic mesentery. Carcinoid tumor is a consideration and could explain the liver lesions. For initial evaluation, contrast-enhanced abdominal pelvic CT (or CT enterography) is recommended. 3. If CT does not show mesenteric lesion, MR follow-up of the  liver lesions in 3-6 months would be recommended. Correlation with alpha-fetoprotein levels also recommended. 4. Nonspecific adenopathy in the porta hepatis, likely related to cirrhosis.   Electronically Signed   By: Camie Patience M.D.   On: 05/10/2013 08:59   Ct Entero Abd/pelvis W/cm  05/11/2013   CLINICAL DATA:  Elevated liver function studies with multiple liver lesions and possible right ileocolonic mesenteric mass on MRI.  EXAM: CT ABDOMEN AND PELVIS WITH CONTRAST (ENTEROGRAPHY)  TECHNIQUE: Multidetector CT of the abdomen and pelvis during bolus administration of intravenous contrast. Negative oral contrast VoLumen was given.  CONTRAST:  173mL OMNIPAQUE IOHEXOL 300 MG/ML  SOLN  COMPARISON:  MR ABDOMEN WO/W CM dated 05/10/2013; US ABDOMEN COMPLETE dated 05/08/2013  FINDINGS: Patchy dependent opacity at the left lung base likely represents atelectasis. The right lung base is clear. There is no significant pleural or pericardial effusion. Bilateral gynecomastia noted.  The liver lesions demonstrated by MRI are subtle on CT and not further characterized. Hepatic contours remain mildly irregular, consistent with underlying cirrhosis. The spleen is normal in size. There is a 8 mm low-density lesion centrally, likely an incidental hemangioma. The gallbladder, biliary system and pancreas appear normal.  There is no adrenal mass. Both kidneys appear normal. There is no hydronephrosis.  The stomach appears unremarkable aside from apparent peristalsis in the pre pyloric region, not clearly demonstrated on MRI. No focal mucosal abnormalities are identified within the small bowel or colon. The colon  is stool-filled. There is edema throughout the mesenteric fat. No focal nodularity, calcifications or masses seen within the ileocolonic mesentery as suggested on MRI. The appendix appears normal. However, there are several enlarged lymph nodes along the right iliac vessels, including a dominant nodal mass superiorly in the right  groin, measuring up to 3.8 x 4.9 cm on image 70. Prominent lymph nodes within the porta hepatis are again noted, likely related to underlying liver disease.  There is mild aortoiliac atherosclerosis. The urinary bladder appears normal. The prostate gland is not significantly enlarged. There is no perirectal adenopathy.  There are no worrisome osseous findings.  IMPRESSION: 1. Dominant nodal mass in the right external iliac chain with additional smaller lymph nodes along the right iliac vessels. No primary small bowel, colonic or mesenteric lesion is identified to suggest underlying carcinoid tumor or adenocarcinoma. These findings are presumably unrelated the liver disease and worrisome for possible lymphoma. Biopsy of the right iliac nodal mass recommended. 2. No evidence of bowel obstruction or extraluminal fluid collection. Mild edema throughout the mesenteric fat is nonspecific although likely related to the patient's liver disease. 3. The liver lesions seen on MRI are not further characterized. Please refer to separate report for follow up recommendations.   Electronically Signed   By: Camie Patience M.D.   On: 05/11/2013 07:46    Scheduled Meds: . insulin aspart  0-15 Units Subcutaneous TID WC  . metoprolol succinate  100 mg Oral Daily  . pantoprazole  80 mg Oral Q1200  . sodium chloride  3 mL Intravenous Q12H   Continuous Infusions:    Principal Problem:   Hyponatremia Active Problems:   Type II or unspecified type diabetes mellitus with neurological manifestations, not stated as uncontrolled(250.60)   HYPERLIPIDEMIA   GOUT   HYPERTENSION   GERD   PEPTIC ULCER DISEASE   Diabetic neuropathy   Elevated LFTs   Other dysphagia   Chest pain   Hyperkalemia   Thrombocytopenia   Hypercalcemia   Orthostasis    Time spent: 45 minutes. Greater than 50% of this time was spent in direct contact with the patient coordinating care.    [facility] Pager [phone]  If  7PM-7AM, please contact night-coverage at [email or web address], password [facility] 05/11/2013, 1:48 PM  LOS: 5 days

## 2013-05-11 NOTE — Op Note (Signed)
[facility] [place], [address]   ENDOSCOPY PROCEDURE REPORT  PATIENT: [name] [name], [name] [name]  MR#: [id number] BIRTHDATE: 1955-06-02 , 57  yrs. old GENDER: Male ENDOSCOPIST: Milus Banister, MD PROCEDURE DATE:  05/11/2013 PROCEDURE:  EGD, diagnostic ASA CLASS:     Class III INDICATIONS:  Chest pain, intermittent dysphagia MEDICATIONS: Fentanyl 25 mcg IV and Versed 2 mg IV TOPICAL ANESTHETIC: none  DESCRIPTION OF PROCEDURE: After the risks benefits and alternatives of the procedure were thoroughly explained, informed consent was obtained.  The Pentax [id number] endoscope was introduced through the mouth and advanced to the second portion of the duodenum. Without limitations.  The instrument was slowly withdrawn as the mucosa was fully examined.    There was very mild, non-specific gastritis.  No signs of portal hypertension (no gastric, esophageal varices, no portal gastropathy).  The GE junction was normal, not stenotic or strictured.  The examination was otherwise normal.  Retroflexed views revealed no abnormalities.     The scope was then withdrawn from the patient and the procedure completed. COMPLICATIONS: There were no complications.  ENDOSCOPIC IMPRESSION: There was very mild, non-specific gastritis.  No signs of portal hypertension (no gastric, esophageal varices, no portal gastropathy).  The GE junction was normal, not stenotic or strictured.  The examination was otherwise normal.  RECOMMENDATIONS: These findings do not explain his intermittent chest pain which I do not feel is GI related.  The esophagus and GE junction were normal. Perhaps mild underlying motility disorder, for now he should simply chew his food well, eat slowly and take small bites.  If swallowing worsens, then could consider esophageal manometry testing.  These findings argue against underlying cirrhosis.  I cannot explain his new thombocytopenia or his  hyponatremia.  He should follow up with GI as needed.   eSigned:  Milus Banister, MD 05/11/2013 8:10 AM   CC: Zenovia Jarred, MD

## 2013-05-11 NOTE — Consult Note (Signed)
  Patient Information    Patient Name Sex DOB SSN    [name], [name] Male 01/12/1956 [id number]       Progress Notes by [name] C Magrinat, MD at 05/11/2013  3:46 PM    Author: Gustav C Magrinat, MD Service: Oncology Author Type: Physician    Filed: 05/11/2013  4:22 PM Note Time: 05/11/2013  3:46 PM Status: Signed    Editor: [name] C Magrinat, MD (Physician)         [name] [name]   [phone] [phone]         ID: [name] [name] OB: 06/09/1955  [name]#: [id number]  CSN#:[id number]   PCP: [name] Bedsole, MD SU:   OTHER MD: [name] [name]   CHIEF COMPLAINT: "I wasn't eating right."   HPI: [name] [name] tells me he decided to try and lose weight and obtained some diet pills --"like amphetamine"--from a friend. These were successful at suppressing his appetite and he started drinking water "to fill up." Around the same time her developed atypical chest pain which was extensively evaluated for a cardiac cause prior admission, and this admission for a GI cause: the patient underwent EGD today showing only gastritis, no evidence of portal HTN and no obvious cause of his pain (which however is not currently present). As part of his workup he was found to have abnormal LFTs, leading to an abdominal US then liver MRI then CT of the abdomen today. The upshot of these tests appears to be that the patient (who admits to drinking up to 72 oz beer/day) may be developing cirrhosis and the liver lesions may be regenerating nodules (AFP was 1.6). However CT of the abd/pelvis todays shows an unexpected lymphoid mass in the R iliac area.   We were consulted for evaluation of the patient's thrombocytopenia. Specifically he had a normal platelet count 05/01/2013, down to   84 on 05/06/2013, in the 60's since 05/08/2013.   INTERVAL HISTORY: I met with [name] [name] in his room 05/11/2013; no family was present   REVIEW OF SYSTEMS: The patient currently denies pain. He describes  his prior pain as "in his stomach" and point to the epigastrium. He denies nausea, vomiting, hematemesis, or hematochezia; denies flushing or dizzyness; denies changes in bowel or bladder habits; denies epistaxis or easy bruising. There have been no drenching sweats, fevers, rash, pruritus, adenopathy, or unexplained fatigue. He has lost about 8 lbs with his "pills and water" regimen. He feels he is now gaining some weight back. A detailed ROS today was otherwise noncontributory   PAST MEDICAL HISTORY: Past Medical History   Diagnosis  Date   .  GERD (gastroesophageal reflux disease)     .  Hypertension     .  Peptic ulcer     .  Gout     .  Hiatal hernia     .  Diabetes mellitus without complication     .  Hyperlipidemia          PAST SURGICAL HISTORY: Past Surgical History   Procedure  Laterality  Date   .  Pilonidal cyst / sinus excision       .  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  Tonsillectomy       .  Colonoscopy            FAMILY HISTORY Family History   Problem  Relation  Age of Onset   .    Gout  Father     .  COPD  Father     .  Diabetes type II  Father     .  Colon cancer  Neg Hx     .  Stomach cancer  Neg Hx     .  Esophageal cancer  Neg Hx     .  Pancreatic cancer  Neg Hx     .  Prostate cancer  Neg Hx     .  Rectal cancer  Neg Hx     .  Diabetes  Mother      The patient's father died from an MI at age 60. The patient's mother is living, age 78. The patient had four brothers, no sisters.   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 His wife [name] [name] is a realtor. They have 4 children only one still living at home                          ADVANCED DIRECTIVES:      HEALTH MAINTENANCE: History   Substance Use Topics   .  Smoking status:  Never Smoker    .  Smokeless tobacco:  Current User       Types:  Snuff   .  Alcohol Use:  No         Comment: last drink 3 weeks ago.  Per Pt not  drinking alcohol now.  When he did drink about 24 beers per week.                    Colonoscopy:             PSA: < 1.0 when last checked (2014)             Lipid panel:    Allergies   Allergen  Reactions   .  Penicillins  Hives         Current Facility-Administered Medications   Medication  Dose  Route  Frequency  Provider  Last Rate  Last Dose   .  insulin aspart (novoLOG) injection 0-15 Units   0-15 Units  Subcutaneous  TID WC  [name] V Thompson, MD     2 Units at 05/10/13 1213   .  metoprolol succinate (TOPROL-XL) 24 hr tablet 100 mg   100 mg  Oral  Daily  [name] V Thompson, MD     100 mg at 05/11/13 1053   .  morphine 2 MG/ML injection 2 mg   2 mg  Intravenous  Q4H PRN  [name] V Thompson, MD     2 mg at 05/08/13 0856   .  oxyCODONE (Oxy IR/ROXICODONE) immediate release tablet 5 mg   5 mg  Oral  Q4H PRN  [name] V Thompson, MD     5 mg at 05/11/13 0424   .  pantoprazole (PROTONIX) EC tablet 80 mg   80 mg  Oral  Q1200  [name] V Thompson, MD     80 mg at 05/11/13 1228   .  sodium chloride 0.9 % injection 3 mL   3 mL  Intravenous  Q12H  [name] V Thompson, MD     3 mL at 05/10/13 2200        OBJECTIVE: middle aged White male examined in recliner Filed Vitals:     05/11/13 1448   BP:     Pulse:       Temp:  98.4 F (36.9 C)   Resp:         Body mass index is 30.9 kg/(m^2).    ECOG FS:1 - Symptomatic but completely ambulatory   Ocular: Sclerae unicteric, EOMs intact Ear-nose-throat: Oropharynx clear and moist Lymphatic: No cervical or supraclavicular adenopathy Lungs no rales or rhonchi, fair excursion bilaterally Heart regular rate and rhythm, no murmur appreciated Abd soft, nontender, positive bowel sounds MSK no focal spinal tenderness, no joint edema Neuro: non-focal, oriented x 3 but hesitant in many answers and sometimes losing train of thought (narcotics? Hypercalcemia?), bland affect       LAB RESULTS: Results for [name] [name] (MRN [id number]) as of  05/11/2013 15:48   Ref. Range  04/23/2013 17:10  04/25/2013 05:35  05/01/2013 12:14  05/06/2013 17:30  05/07/2013 08:10  05/08/2013 03:41  05/11/2013 05:05  05/11/2013 10:55   Platelets  Latest Range: 150-400 K/uL  205  165  172.0  84 (L)  74 (L)  66 (L)  61 (L)  60 (L)    Results for [name] [name] (MRN [id number]) as of 05/11/2013 15:48   Ref. Range  05/06/2013 21:00   LDH  Latest Range: 94-250 U/L  3192 (H)      CMP         Component  Value  Date/Time     NA  127*  05/11/2013 0505     K  4.4  05/11/2013 0505     CL  87*  05/11/2013 0505     CO2  27  05/11/2013 0505     GLUCOSE  102*  05/11/2013 0505     BUN  14  05/11/2013 0505     CREATININE  0.71  05/11/2013 0505     CREATININE  0.83  04/23/2013 1710     CALCIUM  11.5*  05/11/2013 0505     PROT  5.9*  05/09/2013 0334     ALBUMIN  2.3*  05/09/2013 0334     AST  59*  05/09/2013 0334     ALT  33  05/09/2013 0334     ALKPHOS  245*  05/09/2013 0334     BILITOT  0.8  05/09/2013 0334     GFRNONAA  >90  05/11/2013 0505     GFRAA  >90  05/11/2013 0505        I No results found for this basename: SPEP, UPEP,  kappa and lambda light chains         Lab Results   Component  Value  Date     WBC  4.0  05/11/2013     NEUTROABS  2.0  05/06/2013     HGB  12.2*  05/11/2013     HCT  35.9*  05/11/2013     MCV  97.0  05/11/2013     PLT  60*  05/11/2013        _0 @    No results found for this basename: LABCA2         No components found with this basename: LABCA125          Recent Labs Lab  05/11/13 1055   INR  1.29        Urinalysis     Component  Value  Date/Time     COLORURINE  AMBER*  05/06/2013 1747     APPEARANCEUR  CLEAR  05/06/2013 1747     LABSPEC  1.020  05/06/2013 1747     PHURINE  5.5  05/06/2013 1747  GLUCOSEU  NEGATIVE  05/06/2013 1747     GLUCOSEU  NEG mg/dL  07/14/2006 2147     HGBUR  NEGATIVE  05/06/2013 1747     BILIRUBINUR  NEGATIVE  05/06/2013 1747     KETONESUR  NEGATIVE  05/06/2013 1747     PROTEINUR  NEGATIVE  05/06/2013 1747      UROBILINOGEN  0.2  05/06/2013 1747     NITRITE  NEGATIVE  05/06/2013 1747     LEUKOCYTESUR  NEGATIVE  05/06/2013 1747        STUDIES: Dg Chest 2 View   05/06/2013   CLINICAL DATA:  Chest pain  EXAM: CHEST  2 VIEW  COMPARISON:  04/23/2013  FINDINGS: Artifact overlies the chest. Heart size is normal. Mediastinal shadows are normal. The lungs are clear. Ordinary degenerative changes affect the spine. No effusions. BB anterior to the sternum again noted.  IMPRESSION: No active cardiopulmonary disease.   Electronically Signed   By: Mark  Shogry M.D.   On: 05/06/2013 19:28    Dg Chest 2 View   04/23/2013   CLINICAL DATA:  Chest pain  EXAM: CHEST  2 VIEW  COMPARISON:  None  FINDINGS: The heart size and mediastinal contours are within normal limits. Both lungs are clear. The visualized skeletal structures are unremarkable. 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  IMPRESSION: No active cardiopulmonary disease.   Electronically Signed   By: Taylor  Stroud M.D.   On: 04/23/2013 18:10    Ct Angio Chest Pe W/cm &/or Wo Cm   04/23/2013   CLINICAL DATA:  Elevated D-dimer.  Chest pain.  EXAM: CT ANGIOGRAPHY CHEST WITH CONTRAST  TECHNIQUE: Multidetector CT imaging of the chest was performed using the standard protocol during bolus administration of intravenous contrast. Multiplanar CT image reconstructions and MIPs were obtained to evaluate the vascular anatomy.  CONTRAST:  100mL OMNIPAQUE IOHEXOL 350 MG/ML SOLN  COMPARISON:  DG CHEST 2 VIEW dated 04/23/2013  FINDINGS: Slight motion artifact in the mediastinum. Thoracic aorta normal in caliber. Atherosclerotic vascular changes are noted of the thoracic aorta. No aneurysm or dissection. Great vessels patent. Pulmonary arteries are intact. No pulmonary embolus. Cardiomegaly. Coronary artery disease. No pericardial effusion.  No significant mediastinal or hilar adenopathy. Thoracic esophagus unremarkable.  Large airways are patent. Lungs are clear  of acute infiltrates. No pleural effusion or pneumothorax.  Visualized upper abdominal structures are unremarkable.  Thyroid is unremarkable. No supraclavicular or axillary lymph nodes are noted. Chest wall is intact. Degenerative changes thoracic spine. No acute abnormality.  Review of the MIP images confirms the above findings.  IMPRESSION: 1. Coronary artery disease.  Cardiomegaly. 2. No evidence of pulmonary emboli.  No acute abnormality.   Electronically Signed   By: Thomas  Register   On: 04/23/2013 22:51    [name] Abdomen W Wo Contrast   05/10/2013   CLINICAL DATA:  Elevated liver function studies. Hypoechoic liver lesion on ultrasound.  EXAM: MRI ABDOMEN WITHOUT AND WITH CONTRAST  TECHNIQUE: Multiplanar multisequence [name] imaging of the abdomen was performed both before and after the administration of intravenous contrast.  CONTRAST:  17mL MULTIHANCE GADOBENATE DIMEGLUMINE 529 MG/ML IV SOLN  COMPARISON:  US ABDOMEN COMPLETE dated 05/08/2013; CT ANGIO CHEST W/CM &/OR WO/CM dated 04/23/2013  FINDINGS: Examination is mildly motion limited, especially on the dynamic post-contrast images. The liver contours are mildly irregular, and there is asymmetric enlargement of the caudate and medial segment of the left hepatic lobes. In addition, there is   moderate bilateral gynecomastia. These findings suggest underlying cirrhosis. Best seen on the axial T2 FSE images are multiple less than 1 cm liver lesions with T2 hyperintensity. The largest measures 9 mm in the dome of the right lobe on image 12. The liver appears fairly homogeneous on T1 weighted images. Specifically, no T1 hyperintense lesion or significant steatosis is identified. Post-contrast, there are several small foci of nodular enhancement in the liver, suboptimally evaluated due to motion. In the portal phase, the hepatic parenchyma appears fairly homogeneous.  Several mildly prominent lymph nodes are present within the porta hepatis and portacaval space. On  series 3, these measure 10 mm on image 27 and 12 mm on image 31. There is no retroperitoneal lymphadenopathy, ascites or peritoneal nodularity. The portal, superior mesenteric, splenic and hepatic veins are patent. No significant varices are identified. On the coronal images, there is questionable retraction and abnormal enhancement of the mesentery in the right lower quadrant. This area is not included on the axial images. There appear to be prominent right iliac nodes as well on the coronal images.  The gallbladder, biliary system and pancreas appear normal. The spleen, adrenal glands and kidneys appear normal. There is no hydronephrosis. There is a probable hemangioma within the L3 vertebral body.  IMPRESSION: 1. There are multiple small T2 hyperintense liver lesions, some of which demonstrate apparent arterial phase enhancement. Dynamic images are limited by breathing artifact. In the setting of apparent underlying cirrhosis, these findings are consistent with regenerating and dysplastic nodules. 2. Questionable retraction and abnormal enhancement within the ileocolonic mesentery. Carcinoid tumor is a consideration and could explain the liver lesions. For initial evaluation, contrast-enhanced abdominal pelvic CT (or CT enterography) is recommended. 3. If CT does not show mesenteric lesion, [name] follow-up of the liver lesions in 3-6 months would be recommended. Correlation with alpha-fetoprotein levels also recommended. 4. Nonspecific adenopathy in the porta hepatis, likely related to cirrhosis.   Electronically Signed   By: Bill  Veazey M.D.   On: 05/10/2013 08:59    Us Abdomen Complete   05/08/2013   CLINICAL DATA:  Elevated LFTs  EXAM: ULTRASOUND ABDOMEN COMPLETE  COMPARISON:  None.  FINDINGS: Gallbladder:  No gallstones or wall thickening visualized. No sonographic Murphy sign noted.  Common bile duct:  Diameter: 3.4 mm  Liver:  Mildly increased in echogenicity likely related to fatty infiltration. There is  an 8 mm relatively hypoechoic lesion within the right lobe of the liver. Some slight increased echogenicity is noted within. This is of uncertain etiology.  IVC:  No abnormality visualized.  Pancreas:  Visualized portion unremarkable.  Spleen:  Size and appearance within normal limits.  Right Kidney:  Length: 12.1 cm. Echogenicity within normal limits. No mass or hydronephrosis visualized.  Left Kidney:  Length: 12.2 cm. Echogenicity within normal limits. No mass or hydronephrosis visualized.  Abdominal aorta:  No aneurysm visualized.  Other findings:  None.  IMPRESSION: Hypoechoic lesion within the liver as described. This is incompletely evaluated on this exam. Short-term followup may be helpful to assess for stability. Alternatively nonemergent MRI of the liver could be performed.   Electronically Signed   By: Mark  Lukens M.D.   On: 05/08/2013 11:51    Nm Myocar Multi W/spect W/wall Motion / Ef   04/25/2013   CLINICAL DATA:  Chest pain  EXAM: MYOCARDIAL IMAGING WITH SPECT (REST AND PHARMACOLOGIC-STRESS)  GATED LEFT VENTRICULAR WALL MOTION STUDY  LEFT VENTRICULAR EJECTION FRACTION  TECHNIQUE: Standard myocardial SPECT imaging was performed after resting   intravenous injection of 10 mCi Tc-99m sestamibi. Subsequently, intravenous infusion of Lexiscan was performed under the supervision of the Cardiology staff. At peak effect of the drug, 30 mCi Tc-99m sestamibi was injected intravenously and standard myocardial SPECT imaging was performed. Quantitative gated imaging was also performed to evaluate left ventricular wall motion, and estimate left ventricular ejection fraction.  COMPARISON:  None.  FINDINGS: SPECT: Allowing for diaphragmatic attenuation artifact, there are no perfusion defects.  Wall motion:  Normal  Ejection fraction: 78%. End-diastolic volume 53 cc. End systolic volume 11 cc.  IMPRESSION: No perfusion defects allowing for diaphragmatic attenuation artifact.   Electronically Signed   By: Art   Hoss M.D.   On: 04/25/2013 16:27    Ct Entero Abd/pelvis W/cm   05/11/2013   CLINICAL DATA:  Elevated liver function studies with multiple liver lesions and possible right ileocolonic mesenteric mass on MRI.  EXAM: CT ABDOMEN AND PELVIS WITH CONTRAST (ENTEROGRAPHY)  TECHNIQUE: Multidetector CT of the abdomen and pelvis during bolus administration of intravenous contrast. Negative oral contrast VoLumen was given.  CONTRAST:  100mL OMNIPAQUE IOHEXOL 300 MG/ML  SOLN  COMPARISON:  [name] ABDOMEN WO/W CM dated 05/10/2013; US ABDOMEN COMPLETE dated 05/08/2013  FINDINGS: Patchy dependent opacity at the left lung base likely represents atelectasis. The right lung base is clear. There is no significant pleural or pericardial effusion. Bilateral gynecomastia noted.  The liver lesions demonstrated by MRI are subtle on CT and not further characterized. Hepatic contours remain mildly irregular, consistent with underlying cirrhosis. The spleen is normal in size. There is a 8 mm low-density lesion centrally, likely an incidental hemangioma. The gallbladder, biliary system and pancreas appear normal.  There is no adrenal mass. Both kidneys appear normal. There is no hydronephrosis.  The stomach appears unremarkable aside from apparent peristalsis in the pre pyloric region, not clearly demonstrated on MRI. No focal mucosal abnormalities are identified within the small bowel or colon. The colon is stool-filled. There is edema throughout the mesenteric fat. No focal nodularity, calcifications or masses seen within the ileocolonic mesentery as suggested on MRI. The appendix appears normal. However, there are several enlarged lymph nodes along the right iliac vessels, including a dominant nodal mass superiorly in the right groin, measuring up to 3.8 x 4.9 cm on image 70. Prominent lymph nodes within the porta hepatis are again noted, likely related to underlying liver disease.  There is mild aortoiliac atherosclerosis. The urinary bladder  appears normal. The prostate gland is not significantly enlarged. There is no perirectal adenopathy.  There are no worrisome osseous findings.  IMPRESSION: 1. Dominant nodal mass in the right external iliac chain with additional smaller lymph nodes along the right iliac vessels. No primary small bowel, colonic or mesenteric lesion is identified to suggest underlying carcinoid tumor or adenocarcinoma. These findings are presumably unrelated the liver disease and worrisome for possible lymphoma. Biopsy of the right iliac nodal mass recommended. 2. No evidence of bowel obstruction or extraluminal fluid collection. Mild edema throughout the mesenteric fat is nonspecific although likely related to the patient's liver disease. 3. The liver lesions seen on MRI are not further characterized. Please refer to separate report for follow up recommendations.   Electronically Signed   By: Bill  Veazey M.D.   On: 05/11/2013 07:46       ASSESSMENT: 57 y.o. [place] man admitted with multiple electrolyte abnormalities possibly related to "diet pills"/ polydipsia/ spironolactone,   (1) unexplained moderate thrombocytopenia first noted 05/06/2013 (normal 05/01/2013), with normal creatinine, PT/INR   and PTT and no schistocytes on smear.   (2) Also with poorly characterized liver lesions which may be regenerating nodules in the presence of cirrhosis in this patient with documented history of ETOH abuse. (3) R iliac adenopathy with a dominant nodal mass measuring 4.9 cm felt worrisome for lymphoma   PLAN: I discussed [name] [name]' situation in detail with him for >45 minutes. The absence of schistocytes and normal coagulation screens are not consistent with DIC. The same with a normal creatinine are not c/w TTP/HUS. ITP is unlikely given the very recent normal platelet count. There is no splenomegaly. . Heparin induced thrombocytopenia is a possibility and I have requested a HIT panel. Until this is available I would avoid  heparin products. My working diagnosis at this point is marrow damage from ETOH an if this is so platelets should slowly recover without other intervention within the next 2 weeks.   The liver "spots" seen on MRI and CT seem most likely regenerating nodules but I will obtain a 24-hour 5-HIAA. Will also send a CEA.. Note normal AFP and (less recent) PSA.   The R iliac nodal mass is of more concern, and if lymphoma may explain the elevated LDH and hypercalcemia. Note normal absolute lymphocyte count. Agree with recommendation for core biopsy.   Will follow with you [name],[name] C, MD   05/11/2013 3:47 PM           

## 2013-05-11 NOTE — H&P (Signed)
[name] [name] is an 58 y.o. male.   Chief Complaint: pt had been evaluated and hospitalized in 04/2013 for chest pain And abd pain Negative cardiac work up dc'd to return for EGD as OP Sent to ER post evaluation secondary abn labs Admitted with abnormal liver functions US revealed liver lesions MR of Abd revealed liver lesions and Rt inguinal lymphadenopathy Now scheduled for Rt inguinal LN biopsy  HPI: GERD; HTN; DM; HLD  Past Medical History  Diagnosis Date  . GERD (gastroesophageal reflux disease)   . Hypertension   . Peptic ulcer   . Gout   . Hiatal hernia   . Diabetes mellitus without complication   . Hyperlipidemia     Past Surgical History  Procedure Laterality Date  . Pilonidal cyst / sinus excision    . 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 Tonsillectomy    . Colonoscopy      Family History  Problem Relation Age of Onset  . Gout Father   . COPD Father   . Diabetes type II Father   . Colon cancer Neg Hx   . Stomach cancer Neg Hx   . Esophageal cancer Neg Hx   . Pancreatic cancer Neg Hx   . Prostate cancer Neg Hx   . Rectal cancer Neg Hx   . Diabetes Mother    Social History:  reports that he has never smoked. His smokeless tobacco use includes Snuff. He reports that he does not drink alcohol or use illicit drugs.  Allergies:  Allergies  Allergen Reactions  . Penicillins Hives    Medications Prior to Admission  Medication Sig Dispense Refill  . esomeprazole (NEXIUM) 40 MG packet Take 40 mg by mouth daily before breakfast.  30 each  3  . lisinopril (PRINIVIL,ZESTRIL) 40 MG tablet TAKE 1 TABLET BY MOUTH DAILY. PLEASE RESCHEDULE YOUR 6 MONTH FOLLOW UP APPOINTMENT FOR ADDITIONAL REFILLS.  90 tablet  1  . metoprolol succinate (TOPROL-XL) 100 MG 24 hr tablet Take 100 mg by mouth daily. Take with or immediately following a meal.      . oxyCODONE-acetaminophen (PERCOCET/ROXICET) 5-325 MG per tablet Take 1-2 tablets by mouth every 3 (three) hours as  needed for severe pain.  65 tablet  0  . spironolactone (ALDACTONE) 50 MG tablet Take 50 mg by mouth daily.        Results for orders placed during the hospital encounter of 05/06/13 (from the past 48 hour(s))  SODIUM     Status: Abnormal   Collection Time    05/09/13 12:57 PM      Result Value Ref Range   Sodium 126 (*) 137 - 147 mEq/L  GLUCOSE, CAPILLARY     Status: Abnormal   Collection Time    05/09/13  5:08 PM      Result Value Ref Range   Glucose-Capillary 119 (*) 70 - 99 mg/dL  SODIUM     Status: Abnormal   Collection Time    05/09/13  9:40 PM      Result Value Ref Range   Sodium 125 (*) 137 - 147 mEq/L  GLUCOSE, CAPILLARY     Status: Abnormal   Collection Time    05/09/13 10:17 PM      Result Value Ref Range   Glucose-Capillary 108 (*) 70 - 99 mg/dL  BASIC METABOLIC PANEL     Status: Abnormal   Collection Time    05/10/13  3:45 AM      Result  Value Ref Range   Sodium 131 (*) 137 - 147 mEq/L   Potassium 4.9  3.7 - 5.3 mEq/L   Chloride 89 (*) 96 - 112 mEq/L   CO2 30  19 - 32 mEq/L   Glucose, Bld 104 (*) 70 - 99 mg/dL   BUN 14  6 - 23 mg/dL   Creatinine, Ser 0.83  0.50 - 1.35 mg/dL   Calcium 11.8 (*) 8.4 - 10.5 mg/dL   GFR calc non Af Amer >90  >90 mL/min   GFR calc Af Amer >90  >90 mL/min   Comment: (NOTE)     The eGFR has been calculated using the CKD EPI equation.     This calculation has not been validated in all clinical situations.     eGFR's persistently <90 mL/min signify possible Chronic Kidney     Disease.  GLUCOSE, CAPILLARY     Status: Abnormal   Collection Time    05/10/13  8:10 AM      Result Value Ref Range   Glucose-Capillary 103 (*) 70 - 99 mg/dL  GLUCOSE, CAPILLARY     Status: Abnormal   Collection Time    05/10/13 11:51 AM      Result Value Ref Range   Glucose-Capillary 122 (*) 70 - 99 mg/dL  GLUCOSE, CAPILLARY     Status: Abnormal   Collection Time    05/10/13  4:29 PM      Result Value Ref Range   Glucose-Capillary 110 (*) 70 - 99  mg/dL  GLUCOSE, CAPILLARY     Status: Abnormal   Collection Time    05/10/13  9:17 PM      Result Value Ref Range   Glucose-Capillary 112 (*) 70 - 99 mg/dL   Comment 1 Documented in Chart     Comment 2 Notify RN    CBC     Status: Abnormal   Collection Time    05/11/13  5:05 AM      Result Value Ref Range   WBC 4.0  4.0 - 10.5 K/uL   RBC 3.70 (*) 4.22 - 5.81 MIL/uL   Hemoglobin 12.2 (*) 13.0 - 17.0 g/dL   HCT 35.9 (*) 39.0 - 52.0 %   MCV 97.0  78.0 - 100.0 fL   MCH 33.0  26.0 - 34.0 pg   MCHC 34.0  30.0 - 36.0 g/dL   RDW 12.7  11.5 - 15.5 %   Platelets 61 (*) 150 - 400 K/uL   Comment: CONSISTENT WITH PREVIOUS RESULT  PROTIME-INR     Status: Abnormal   Collection Time    05/11/13  5:05 AM      Result Value Ref Range   Prothrombin Time 16.0 (*) 11.6 - 15.2 seconds   INR 1.31  0.00 - 1.61  BASIC METABOLIC PANEL     Status: Abnormal   Collection Time    05/11/13  5:05 AM      Result Value Ref Range   Sodium 127 (*) 137 - 147 mEq/L   Potassium 4.4  3.7 - 5.3 mEq/L   Chloride 87 (*) 96 - 112 mEq/L   CO2 27  19 - 32 mEq/L   Glucose, Bld 102 (*) 70 - 99 mg/dL   BUN 14  6 - 23 mg/dL   Creatinine, Ser 0.71  0.50 - 1.35 mg/dL   Calcium 11.5 (*) 8.4 - 10.5 mg/dL   GFR calc non Af Amer >90  >90 mL/min   GFR calc Af Amer >90  >  90 mL/min   Comment: (NOTE)     The eGFR has been calculated using the CKD EPI equation.     This calculation has not been validated in all clinical situations.     eGFR's persistently <90 mL/min signify possible Chronic Kidney     Disease.  GLUCOSE, CAPILLARY     Status: None   Collection Time    05/11/13  8:13 AM      Result Value Ref Range   Glucose-Capillary 83  70 - 99 mg/dL  DIC (DISSEMINATED INTRAVASCULAR COAGULATION) PANEL     Status: Abnormal (Preliminary result)   Collection Time    05/11/13 10:55 AM      Result Value Ref Range   Prothrombin Time 15.8 (*) 11.6 - 15.2 seconds   INR 1.29  0.00 - 1.49   aPTT 32  24 - 37 seconds   Fibrinogen  >800 (*) 204 - 475 mg/dL   D-Dimer, Quant PENDING  0.00 - 0.48 ug/mL-FEU   Platelets 60 (*) 150 - 400 K/uL   Comment: CONSISTENT WITH PREVIOUS RESULT   Smear Review NO SCHISTOCYTES SEEN     Mr Abdomen W Wo Contrast  05/10/2013   CLINICAL DATA:  Elevated liver function studies. Hypoechoic liver lesion on ultrasound.  EXAM: MRI ABDOMEN WITHOUT AND WITH CONTRAST  TECHNIQUE: Multiplanar multisequence MR imaging of the abdomen was performed both before and after the administration of intravenous contrast.  CONTRAST:  76mL MULTIHANCE GADOBENATE DIMEGLUMINE 529 MG/ML IV SOLN  COMPARISON:  US ABDOMEN COMPLETE dated 05/08/2013; CT ANGIO CHEST W/CM &/OR WO/CM dated 04/23/2013  FINDINGS: Examination is mildly motion limited, especially on the dynamic post-contrast images. The liver contours are mildly irregular, and there is asymmetric enlargement of the caudate and medial segment of the left hepatic lobes. In addition, there is moderate bilateral gynecomastia. These findings suggest underlying cirrhosis. Best seen on the axial T2 FSE images are multiple less than 1 cm liver lesions with T2 hyperintensity. The largest measures 9 mm in the dome of the right lobe on image 12. The liver appears fairly homogeneous on T1 weighted images. Specifically, no T1 hyperintense lesion or significant steatosis is identified. Post-contrast, there are several small foci of nodular enhancement in the liver, suboptimally evaluated due to motion. In the portal phase, the hepatic parenchyma appears fairly homogeneous.  Several mildly prominent lymph nodes are present within the porta hepatis and portacaval space. On series 3, these measure 10 mm on image 27 and 12 mm on image 31. There is no retroperitoneal lymphadenopathy, ascites or peritoneal nodularity. The portal, superior mesenteric, splenic and hepatic veins are patent. No significant varices are identified. On the coronal images, there is questionable retraction and abnormal  enhancement of the mesentery in the right lower quadrant. This area is not included on the axial images. There appear to be prominent right iliac nodes as well on the coronal images.  The gallbladder, biliary system and pancreas appear normal. The spleen, adrenal glands and kidneys appear normal. There is no hydronephrosis. There is a probable hemangioma within the L3 vertebral body.  IMPRESSION: 1. There are multiple small T2 hyperintense liver lesions, some of which demonstrate apparent arterial phase enhancement. Dynamic images are limited by breathing artifact. In the setting of apparent underlying cirrhosis, these findings are consistent with regenerating and dysplastic nodules. 2. Questionable retraction and abnormal enhancement within the ileocolonic mesentery. Carcinoid tumor is a consideration and could explain the liver lesions. For initial evaluation, contrast-enhanced abdominal pelvic CT (or CT  enterography) is recommended. 3. If CT does not show mesenteric lesion, MR follow-up of the liver lesions in 3-6 months would be recommended. Correlation with alpha-fetoprotein levels also recommended. 4. Nonspecific adenopathy in the porta hepatis, likely related to cirrhosis.   Electronically Signed   By: Camie Patience M.D.   On: 05/10/2013 08:59   Ct Entero Abd/pelvis W/cm  05/11/2013   CLINICAL DATA:  Elevated liver function studies with multiple liver lesions and possible right ileocolonic mesenteric mass on MRI.  EXAM: CT ABDOMEN AND PELVIS WITH CONTRAST (ENTEROGRAPHY)  TECHNIQUE: Multidetector CT of the abdomen and pelvis during bolus administration of intravenous contrast. Negative oral contrast VoLumen was given.  CONTRAST:  118mL OMNIPAQUE IOHEXOL 300 MG/ML  SOLN  COMPARISON:  MR ABDOMEN WO/W CM dated 05/10/2013; US ABDOMEN COMPLETE dated 05/08/2013  FINDINGS: Patchy dependent opacity at the left lung base likely represents atelectasis. The right lung base is clear. There is no significant pleural or  pericardial effusion. Bilateral gynecomastia noted.  The liver lesions demonstrated by MRI are subtle on CT and not further characterized. Hepatic contours remain mildly irregular, consistent with underlying cirrhosis. The spleen is normal in size. There is a 8 mm low-density lesion centrally, likely an incidental hemangioma. The gallbladder, biliary system and pancreas appear normal.  There is no adrenal mass. Both kidneys appear normal. There is no hydronephrosis.  The stomach appears unremarkable aside from apparent peristalsis in the pre pyloric region, not clearly demonstrated on MRI. No focal mucosal abnormalities are identified within the small bowel or colon. The colon is stool-filled. There is edema throughout the mesenteric fat. No focal nodularity, calcifications or masses seen within the ileocolonic mesentery as suggested on MRI. The appendix appears normal. However, there are several enlarged lymph nodes along the right iliac vessels, including a dominant nodal mass superiorly in the right groin, measuring up to 3.8 x 4.9 cm on image 70. Prominent lymph nodes within the porta hepatis are again noted, likely related to underlying liver disease.  There is mild aortoiliac atherosclerosis. The urinary bladder appears normal. The prostate gland is not significantly enlarged. There is no perirectal adenopathy.  There are no worrisome osseous findings.  IMPRESSION: 1. Dominant nodal mass in the right external iliac chain with additional smaller lymph nodes along the right iliac vessels. No primary small bowel, colonic or mesenteric lesion is identified to suggest underlying carcinoid tumor or adenocarcinoma. These findings are presumably unrelated the liver disease and worrisome for possible lymphoma. Biopsy of the right iliac nodal mass recommended. 2. No evidence of bowel obstruction or extraluminal fluid collection. Mild edema throughout the mesenteric fat is nonspecific although likely related to the  patient's liver disease. 3. The liver lesions seen on MRI are not further characterized. Please refer to separate report for follow up recommendations.   Electronically Signed   By: Camie Patience M.D.   On: 05/11/2013 07:46    Review of Systems  Constitutional: Negative for fever.  Respiratory: Negative for shortness of breath.   Gastrointestinal: Positive for abdominal pain. Negative for nausea and vomiting.  Musculoskeletal: Negative for back pain.  Neurological: Negative for dizziness, weakness and headaches.    Blood pressure 106/67, pulse 76, temperature 97.7 F (36.5 C), temperature source Axillary, resp. rate 16, height $RemoveBe'5\' 4"'[id number]$  (1.626 m), weight 81.693 kg (180 lb 1.6 oz), SpO2 93.00%. Physical Exam  Constitutional: He is oriented to person, place, and time. He appears well-nourished.  Cardiovascular: Normal rate and regular rhythm.   No murmur heard.  Respiratory: Effort normal and breath sounds normal. He has no wheezes.  GI: Soft. Bowel sounds are normal. There is no tenderness.  Musculoskeletal: Normal range of motion.  Neurological: He is alert and oriented to person, place, and time.  Seems mildly confused; is able to state name; dob; month/yr; place; time  Skin: Skin is warm and dry.  Psychiatric: He has a normal mood and affect. His behavior is normal. Judgment and thought content normal.     Assessment/Plan Elevated liver functions US revealed liver lesions MR abd: liver lesions and inguinal LAN Now scheduled for inguinal LN biopsy in CT 05/13/13 Pt aware of procedure benefits and risks and agreeable to poceed Consent signed and in chart  Kmari Halter A 05/11/2013, 11:38 AM

## 2013-05-11 NOTE — Progress Notes (Signed)
[place]  Telephone:(336) [phone] Fax:(336) [phone]     ID: [name] [name] OB: Sep 11, 1955  MR#: [id number]  YBO#:[id number]  PCP: [name], MD SU:  OTHER MD: Owens Loffler  CHIEF COMPLAINT: "I wasn't eating right."  HPI: [name] tells me he decided to try and lose weight and obtained some diet pills --"like amphetamine"--from a friend. These were successful at suppressing his appetite and he started drinking water "to fill up." Around the same time her developed atypical chest pain which was extensively evaluated for a cardiac cause prior admission, and this admission for a GI cause: the patient underwent EGD today showing only gastritis, no evidence of portal HTN and no obvious cause of his pain (which however is not currently present). As part of his workup he was found to have abnormal LFTs, leading to an abdominal US then liver MRI then CT of the abdomen today. The upshot of these tests appears to be that the patient (who admits to drinking up to 72 oz beer/day) may be developing cirrhosis and the liver lesions may be regenerating nodules (AFP was 1.6). However CT of the abd/pelvis todays shows an unexpected lymphoid mass in the R iliac area.  We were consulted for evaluation of the patient's thrombocytopenia. Specifically he had a normal platelet count 05/01/2013, down to  84 on 05/06/2013, in the 60's since 05/08/2013.  INTERVAL HISTORY: I met with Mr Prevo in his room 05/11/2013; no family was present  REVIEW OF SYSTEMS: The patient currently denies pain. He describes his prior pain as "in his stomach" and point to the epigastrium. He denies nausea, vomiting, hematemesis, or hematochezia; denies flushing or dizzyness; denies changes in bowel or bladder habits; denies epistaxis or easy bruising. There have been no drenching sweats, fevers, rash, pruritus, adenopathy, or unexplained fatigue. He has lost about 8 lbs with his "pills and water" regimen. He feels  he is now gaining some weight back. A detailed ROS today was otherwise noncontributory  PAST MEDICAL HISTORY: Past Medical History  Diagnosis Date  . GERD (gastroesophageal reflux disease)   . Hypertension   . Peptic ulcer   . Gout   . Hiatal hernia   . Diabetes mellitus without complication   . Hyperlipidemia     PAST SURGICAL HISTORY: Past Surgical History  Procedure Laterality Date  . Pilonidal cyst / sinus excision    . 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 Tonsillectomy    . Colonoscopy      FAMILY HISTORY Family History  Problem Relation Age of Onset  . Gout Father   . COPD Father   . Diabetes type II Father   . Colon cancer Neg Hx   . Stomach cancer Neg Hx   . Esophageal cancer Neg Hx   . Pancreatic cancer Neg Hx   . Prostate cancer Neg Hx   . Rectal cancer Neg Hx   . Diabetes Mother   The patient's father died from an MI at age 68. The patient's mother is living, age 58. The patient had four brothers, no sisters.  SOCIAL HISTORY:  The patient works as a Fish farm manager work himself as well. His wife [name] [name] is a Cabin crew. They have 4 children only one still living at home    ADVANCED DIRECTIVES:    HEALTH MAINTENANCE: History  Substance Use Topics  . Smoking status: Never Smoker   . Smokeless tobacco: Current User    Types: Snuff  . Alcohol  Use: No     Comment: last drink 3 weeks ago.  Per Pt not drinking alcohol now.  When he did drink about 24 beers per week.     Colonoscopy:  PSA: < 1.0 when last checked (2014)  Lipid panel:  Allergies  Allergen Reactions  . Penicillins Hives    Current Facility-Administered Medications  Medication Dose Route Frequency Provider Last Rate Last Dose  . insulin aspart (novoLOG) injection 0-15 Units  0-15 Units Subcutaneous TID WC Eugenie Filler, MD   2 Units at 05/10/13 1213  . metoprolol succinate (TOPROL-XL) 24 hr tablet 100 mg  100 mg Oral Daily Eugenie Filler, MD   100 mg at  05/11/13 1053  . morphine 2 MG/ML injection 2 mg  2 mg Intravenous Q4H PRN Eugenie Filler, MD   2 mg at 05/08/13 0856  . oxyCODONE (Oxy IR/ROXICODONE) immediate release tablet 5 mg  5 mg Oral Q4H PRN Eugenie Filler, MD   5 mg at 05/11/13 0424  . pantoprazole (PROTONIX) EC tablet 80 mg  80 mg Oral Q1200 Eugenie Filler, MD   80 mg at 05/11/13 1228  . sodium chloride 0.9 % injection 3 mL  3 mL Intravenous Q12H Eugenie Filler, MD   3 mL at 05/10/13 2200    OBJECTIVE: middle aged [place] male examined in [place]:   05/11/13 1448  BP:   Pulse:   Temp: 98.4 F (36.9 C)  Resp:      Body mass index is 30.9 kg/(m^2).    ECOG FS:1 - Symptomatic but completely ambulatory  Ocular: Sclerae unicteric, EOMs intact Ear-nose-throat: Oropharynx clear and moist Lymphatic: No cervical or supraclavicular adenopathy Lungs no rales or rhonchi, fair excursion bilaterally Heart regular rate and rhythm, no murmur appreciated Abd soft, nontender, positive bowel sounds MSK no focal spinal tenderness, no joint edema Neuro: non-focal, oriented x 3 but hesitant in many answers and sometimes losing train of thought (narcotics? Hypercalcemia?), bland affect    LAB RESULTS: Results for EGOR, FULLILOVE (MRN [id number]) as of 05/11/2013 15:48  Ref. Range 04/23/2013 17:10 04/25/2013 05:35 05/01/2013 12:14 05/06/2013 17:30 05/07/2013 08:10 05/08/2013 03:41 05/11/2013 05:05 05/11/2013 10:55  Platelets Latest Range: 150-400 K/uL 205 165 172.0 84 (L) 74 (L) 66 (L) 61 (L) 60 (L)  Results for DRESDEN, LOZITO (MRN [id number]) as of 05/11/2013 15:48  Ref. Range 05/06/2013 21:00  LDH Latest Range: 94-250 U/L 3192 (H)   CMP     Component Value Date/Time   NA 127* 05/11/2013 0505   K 4.4 05/11/2013 0505   CL 87* 05/11/2013 0505   CO2 27 05/11/2013 0505   GLUCOSE 102* 05/11/2013 0505   BUN 14 05/11/2013 0505   CREATININE 0.71 05/11/2013 0505   CREATININE 0.83 04/23/2013 1710   CALCIUM 11.5* 05/11/2013 0505   PROT 5.9* 05/09/2013  0334   ALBUMIN 2.3* 05/09/2013 0334   AST 59* 05/09/2013 0334   ALT 33 05/09/2013 0334   ALKPHOS 245* 05/09/2013 0334   BILITOT 0.8 05/09/2013 0334   GFRNONAA >90 05/11/2013 0505   GFRAA >90 05/11/2013 0505    I No results found for this basename: SPEP, UPEP,  kappa and lambda light chains    Lab Results  Component Value Date   WBC 4.0 05/11/2013   NEUTROABS 2.0 05/06/2013   HGB 12.2* 05/11/2013   HCT 35.9* 05/11/2013   MCV 97.0 05/11/2013   PLT 60* 05/11/2013    _0 @  No results found for this basename:  LABCA2    No components found with this basename: LABCA125     Recent Labs Lab 05/11/13 1055  INR 1.29    Urinalysis    Component Value Date/Time   COLORURINE AMBER* 05/06/2013 1747   APPEARANCEUR CLEAR 05/06/2013 1747   LABSPEC 1.020 05/06/2013 1747   PHURINE 5.5 05/06/2013 1747   GLUCOSEU NEGATIVE 05/06/2013 1747   GLUCOSEU NEG mg/dL 07/14/2006 2147   HGBUR NEGATIVE 05/06/2013 1747   BILIRUBINUR NEGATIVE 05/06/2013 1747   KETONESUR NEGATIVE 05/06/2013 1747   PROTEINUR NEGATIVE 05/06/2013 1747   UROBILINOGEN 0.2 05/06/2013 1747   NITRITE NEGATIVE 05/06/2013 1747   LEUKOCYTESUR NEGATIVE 05/06/2013 1747    STUDIES: Dg Chest 2 View  05/06/2013   CLINICAL DATA:  Chest pain  EXAM: CHEST  2 VIEW  COMPARISON:  04/23/2013  FINDINGS: Artifact overlies the chest. Heart size is normal. Mediastinal shadows are normal. The lungs are clear. Ordinary degenerative changes affect the spine. No effusions. BB anterior to the sternum again noted.  IMPRESSION: No active cardiopulmonary disease.   Electronically Signed   By: Nelson Chimes M.D.   On: 05/06/2013 19:28   Dg Chest 2 View  04/23/2013   CLINICAL DATA:  Chest pain  EXAM: CHEST  2 VIEW  COMPARISON:  None  FINDINGS: The heart size and mediastinal contours are within normal limits. Both lungs are clear. The visualized skeletal structures are unremarkable. 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  IMPRESSION: No active  cardiopulmonary disease.   Electronically Signed   By: [name] M.D.   On: 04/23/2013 18:10   Ct Angio Chest Pe W/cm &/or Wo Cm  04/23/2013   CLINICAL DATA:  Elevated D-dimer.  Chest pain.  EXAM: CT ANGIOGRAPHY CHEST WITH CONTRAST  TECHNIQUE: Multidetector CT imaging of the chest was performed using the standard protocol during bolus administration of intravenous contrast. Multiplanar CT image reconstructions and MIPs were obtained to evaluate the vascular anatomy.  CONTRAST:  148m OMNIPAQUE IOHEXOL 350 MG/ML SOLN  COMPARISON:  DG CHEST 2 VIEW dated 04/23/2013  FINDINGS: Slight motion artifact in the mediastinum. Thoracic aorta normal in caliber. Atherosclerotic vascular changes are noted of the thoracic aorta. No aneurysm or dissection. Great vessels patent. Pulmonary arteries are intact. No pulmonary embolus. Cardiomegaly. Coronary artery disease. No pericardial effusion.  No significant mediastinal or hilar adenopathy. Thoracic esophagus unremarkable.  Large airways are patent. Lungs are clear of acute infiltrates. No pleural effusion or pneumothorax.  Visualized upper abdominal structures are unremarkable.  Thyroid is unremarkable. No supraclavicular or axillary lymph nodes are noted. Chest wall is intact. Degenerative changes thoracic spine. No acute abnormality.  Review of the MIP images confirms the above findings.  IMPRESSION: 1. Coronary artery disease.  Cardiomegaly. 2. No evidence of pulmonary emboli.  No acute abnormality.   Electronically Signed   By: TMarcello Moores Register   On: 04/23/2013 22:51   Mr Abdomen W Wo Contrast  05/10/2013   CLINICAL DATA:  Elevated liver function studies. Hypoechoic liver lesion on ultrasound.  EXAM: MRI ABDOMEN WITHOUT AND WITH CONTRAST  TECHNIQUE: Multiplanar multisequence MR imaging of the abdomen was performed both before and after the administration of intravenous contrast.  CONTRAST:  161mMULTIHANCE GADOBENATE DIMEGLUMINE 529 MG/ML IV SOLN  COMPARISON:  [place] COMPLETE dated 05/08/2013; CT ANGIO CHEST W/CM &/OR WO/CM dated 04/23/2013  FINDINGS: Examination is mildly motion limited, especially on the dynamic post-contrast images. The liver contours are mildly irregular, and there is asymmetric enlargement of  the caudate and medial segment of the left hepatic lobes. In addition, there is moderate bilateral gynecomastia. These findings suggest underlying cirrhosis. Best seen on the axial T2 FSE images are multiple less than 1 cm liver lesions with T2 hyperintensity. The largest measures 9 mm in the dome of the right lobe on image 12. The liver appears fairly homogeneous on T1 weighted images. Specifically, no T1 hyperintense lesion or significant steatosis is identified. Post-contrast, there are several small foci of nodular enhancement in the liver, suboptimally evaluated due to motion. In the portal phase, the hepatic parenchyma appears fairly homogeneous.  Several mildly prominent lymph nodes are present within the porta hepatis and portacaval space. On series 3, these measure 10 mm on image 27 and 12 mm on image 31. There is no retroperitoneal lymphadenopathy, ascites or peritoneal nodularity. The portal, superior mesenteric, splenic and hepatic veins are patent. No significant varices are identified. On the coronal images, there is questionable retraction and abnormal enhancement of the mesentery in the right lower quadrant. This area is not included on the axial images. There appear to be prominent right iliac nodes as well on the coronal images.  The gallbladder, biliary system and pancreas appear normal. The spleen, adrenal glands and kidneys appear normal. There is no hydronephrosis. There is a probable hemangioma within the L3 vertebral body.  IMPRESSION: 1. There are multiple small T2 hyperintense liver lesions, some of which demonstrate apparent arterial phase enhancement. Dynamic images are limited by breathing artifact. In the setting of apparent  underlying cirrhosis, these findings are consistent with regenerating and dysplastic nodules. 2. Questionable retraction and abnormal enhancement within the ileocolonic mesentery. Carcinoid tumor is a consideration and could explain the liver lesions. For initial evaluation, contrast-enhanced abdominal pelvic CT (or CT enterography) is recommended. 3. If CT does not show mesenteric lesion, MR follow-up of the liver lesions in 3-6 months would be recommended. Correlation with alpha-fetoprotein levels also recommended. 4. Nonspecific adenopathy in the porta hepatis, likely related to cirrhosis.   Electronically Signed   By: Camie Patience M.D.   On: 05/10/2013 08:59   US Abdomen Complete  05/08/2013   CLINICAL DATA:  Elevated LFTs  EXAM: ULTRASOUND ABDOMEN COMPLETE  COMPARISON:  None.  FINDINGS: Gallbladder:  No gallstones or wall thickening visualized. No sonographic Murphy sign noted.  Common bile duct:  Diameter: 3.4 mm  Liver:  Mildly increased in echogenicity likely related to fatty infiltration. There is an 8 mm relatively hypoechoic lesion within the right lobe of the liver. Some slight increased echogenicity is noted within. This is of uncertain etiology.  IVC:  No abnormality visualized.  Pancreas:  Visualized portion unremarkable.  Spleen:  Size and appearance within normal limits.  Right [name]:  Length: 12.1 cm. Echogenicity within normal limits. No mass or hydronephrosis visualized.  Left [name]:  Length: 12.2 cm. Echogenicity within normal limits. No mass or hydronephrosis visualized.  Abdominal aorta:  No aneurysm visualized.  Other findings:  None.  IMPRESSION: Hypoechoic lesion within the liver as described. This is incompletely evaluated on this exam. Short-term followup may be helpful to assess for stability. Alternatively nonemergent MRI of the liver could be performed.   Electronically Signed   By: Inez Catalina M.D.   On: 05/08/2013 11:51   Nm Myocar Multi W/spect W/wall Motion / Ef  04/25/2013    CLINICAL DATA:  Chest pain  EXAM: MYOCARDIAL IMAGING WITH SPECT (REST AND PHARMACOLOGIC-STRESS)  GATED LEFT VENTRICULAR WALL MOTION STUDY  LEFT VENTRICULAR EJECTION FRACTION  TECHNIQUE: Standard myocardial SPECT imaging was performed after resting intravenous injection of 10 mCi [id number]. Subsequently, intravenous infusion of Lexiscan was performed under the supervision of the Cardiology staff. At peak effect of the drug, 30 mCi [id number] was injected intravenously and standard myocardial SPECT imaging was performed. Quantitative gated imaging was also performed to evaluate left ventricular wall motion, and estimate left ventricular ejection fraction.  COMPARISON:  None.  FINDINGS: SPECT: Allowing for diaphragmatic attenuation artifact, there are no perfusion defects.  Wall motion:  Normal  Ejection fraction: 78%. End-diastolic volume 53 cc. End systolic volume 11 cc.  IMPRESSION: No perfusion defects allowing for diaphragmatic attenuation artifact.   Electronically Signed   By: ArMaryclare Bean.D.   On: 04/25/2013 16:27   Ct Entero Abd/pelvis W/cm  05/11/2013   CLINICAL DATA:  Elevated liver function studies with multiple liver lesions and possible right ileocolonic mesenteric mass on MRI.  EXAM: CT ABDOMEN AND PELVIS WITH CONTRAST (ENTEROGRAPHY)  TECHNIQUE: Multidetector CT of the abdomen and pelvis during bolus administration of intravenous contrast. Negative oral contrast VoLumen was given.  CONTRAST:  10060mMNIPAQUE IOHEXOL 300 MG/ML  SOLN  COMPARISON:  MR ABDOMEN WO/W CM dated 05/10/2013; [place] COMPLETE dated 05/08/2013  FINDINGS: Patchy dependent opacity at the left lung base likely represents atelectasis. The right lung base is clear. There is no significant pleural or pericardial effusion. Bilateral gynecomastia noted.  The liver lesions demonstrated by MRI are subtle on CT and not further characterized. Hepatic contours remain mildly irregular, consistent with underlying cirrhosis. The  spleen is normal in size. There is a 8 mm low-density lesion centrally, likely an incidental hemangioma. The gallbladder, biliary system and pancreas appear normal.  There is no adrenal mass. Both kidneys appear normal. There is no hydronephrosis.  The stomach appears unremarkable aside from apparent peristalsis in the pre pyloric region, not clearly demonstrated on MRI. No focal mucosal abnormalities are identified within the small bowel or colon. The colon is stool-filled. There is edema throughout the mesenteric fat. No focal nodularity, calcifications or masses seen within the ileocolonic mesentery as suggested on MRI. The appendix appears normal. However, there are several enlarged lymph nodes along the right iliac vessels, including a dominant nodal mass superiorly in the right groin, measuring up to 3.8 x 4.9 cm on image 70. Prominent lymph nodes within the porta hepatis are again noted, likely related to underlying liver disease.  There is mild aortoiliac atherosclerosis. The urinary bladder appears normal. The prostate gland is not significantly enlarged. There is no perirectal adenopathy.  There are no worrisome osseous findings.  IMPRESSION: 1. Dominant nodal mass in the right external iliac chain with additional smaller lymph nodes along the right iliac vessels. No primary small bowel, colonic or mesenteric lesion is identified to suggest underlying carcinoid tumor or adenocarcinoma. These findings are presumably unrelated the liver disease and worrisome for possible lymphoma. Biopsy of the right iliac nodal mass recommended. 2. No evidence of bowel obstruction or extraluminal fluid collection. Mild edema throughout the mesenteric fat is nonspecific although likely related to the patient's liver disease. 3. The liver lesions seen on MRI are not further characterized. Please refer to separate report for follow up recommendations.   Electronically Signed   By: BilCamie PatienceD.   On: 05/11/2013 07:46     ASSESSMENT: 57 1o. [place] man admitted with multiple electrolyte abnormalities possibly related to "diet pills"/ polydipsia/ spironolactone,  (1) unexplained moderate thrombocytopenia first noted 05/06/2013 (normal 05/01/2013), with  normal creatinine, PT/INR and PTT and no schistocytes on smear.  (2) Also with poorly characterized liver lesions which may be regenerating nodules in the presence of cirrhosis in this patient with documented history of ETOH abuse. (3) R iliac adenopathy with a dominant nodal mass measuring 4.9 cm felt worrisome for lymphoma  PLAN: I discussed Mr. Donaldson' situation in detail with him for >45 minutes. The absence of schistocytes and normal coagulation screens are not consistent with DIC. The same with a normal creatinine are not c/w TTP/HUS. ITP is unlikely given the very recent normal platelet count. There is no splenomegaly. . Heparin induced thrombocytopenia is a possibility and I have requested a HIT panel. Until this is available I would avoid heparin products. My working diagnosis at this point is marrow damage from ETOH an if this is so platelets should slowly recover without other intervention within the next 2 weeks.  The liver "spots" seen on MRI and CT seem most likely regenerating nodules but I will obtain a 24-hour 5-HIAA. Will also send a CEA.. Note normal AFP and (less recent) PSA.  The R iliac nodal mass is of more concern, and if lymphoma may explain the elevated LDH and hypercalcemia. Note normal absolute lymphocyte count. Agree with recommendation for core biopsy.  Will follow with you Chauncey Cruel, MD   05/11/2013 3:47 PM

## 2013-05-12 ENCOUNTER — Inpatient Hospital Stay (HOSPITAL_COMMUNITY): Payer: No Typology Code available for payment source

## 2013-05-12 ENCOUNTER — Other Ambulatory Visit: Payer: Self-pay | Admitting: Oncology

## 2013-05-12 LAB — BASIC METABOLIC PANEL
BUN: 20 mg/dL (ref 6–23)
BUN: 18 mg/dL (ref 6–23)
CALCIUM: 11.4 mg/dL — AB (ref 8.4–10.5)
CHLORIDE: 85 meq/L — AB (ref 96–112)
CO2: 26 mEq/L (ref 19–32)
CO2: 26 mEq/L (ref 19–32)
CREATININE: 0.78 mg/dL (ref 0.50–1.35)
CREATININE: 0.78 mg/dL (ref 0.50–1.35)
Calcium: 10.8 mg/dL — ABNORMAL HIGH (ref 8.4–10.5)
Chloride: 86 mEq/L — ABNORMAL LOW (ref 96–112)
GFR calc Af Amer: >90 mL/min (ref >90)
GFR calc non Af Amer: >90 mL/min (ref >90)
Glucose, Bld: 108 mg/dL — ABNORMAL HIGH (ref 70–99)
Glucose, Bld: 162 mg/dL — ABNORMAL HIGH (ref 70–99)
Potassium: 5.9 mEq/L — ABNORMAL HIGH (ref 3.7–5.3)
Potassium: 4.3 mEq/L (ref 3.7–5.3)
Sodium: 126 mEq/L — ABNORMAL LOW (ref 137–147)
Sodium: 124 mEq/L — ABNORMAL LOW (ref 137–147)

## 2013-05-12 LAB — CBC WITH DIFFERENTIAL/PLATELET
BASOS PCT: 1 % (ref 0–1)
Basophils Absolute: 0 10*3/uL (ref 0.0–0.1)
EOS PCT: 0 % (ref 0–5)
Eosinophils Absolute: 0 10*3/uL (ref 0.0–0.7)
HCT: 37.6 % — ABNORMAL LOW (ref 39.0–52.0)
HEMOGLOBIN: 12.9 g/dL — AB (ref 13.0–17.0)
LYMPHS PCT: 39 % (ref 12–46)
Lymphs Abs: 1.6 10*3/uL (ref 0.7–4.0)
MCH: 33 pg (ref 26.0–34.0)
MCHC: 34.3 g/dL (ref 30.0–36.0)
MCV: 96.2 fL (ref 78.0–100.0)
MONO ABS: 0.5 10*3/uL (ref 0.1–1.0)
Monocytes Relative: 11 % (ref 3–12)
Neutro Abs: 2.1 10*3/uL (ref 1.7–7.7)
Neutrophils Relative %: 49 % (ref 43–77)
Platelets: 61 10*3/uL — ABNORMAL LOW (ref 150–400)
RBC: 3.91 MIL/uL — AB (ref 4.22–5.81)
RDW: 12.9 % (ref 11.5–15.5)
WBC: 4.2 10*3/uL (ref 4.0–10.5)

## 2013-05-12 LAB — CEA: CEA: 1.1 ng/mL (ref 0.0–5.0)

## 2013-05-12 LAB — GLUCOSE, CAPILLARY
Glucose-Capillary: 106 mg/dL — ABNORMAL HIGH (ref 70–99)
Glucose-Capillary: 124 mg/dL — ABNORMAL HIGH (ref 70–99)
Glucose-Capillary: 97 mg/dL (ref 70–99)
Glucose-Capillary: 114 mg/dL — ABNORMAL HIGH (ref 70–99)

## 2013-05-12 LAB — LACTATE DEHYDROGENASE: LDH: 3045 U/L — AB (ref 94–250)

## 2013-05-12 MED ORDER — SODIUM CHLORIDE 0.9 % IV SOLN
60.0000 mg | Freq: Once | INTRAVENOUS | Status: AC
  Administered 2013-05-12: 60 mg via INTRAVENOUS
  Filled 2013-05-12: qty 20
  Filled 2013-05-12: qty 6.67

## 2013-05-12 MED ORDER — ACETAMINOPHEN 325 MG PO TABS
650.0000 mg | ORAL_TABLET | Freq: Four times a day (QID) | ORAL | Status: DC | PRN
  Administered 2013-05-12: 650 mg via ORAL
  Filled 2013-05-12: qty 2

## 2013-05-12 MED ORDER — DEMECLOCYCLINE HCL 150 MG PO TABS
150.0000 mg | ORAL_TABLET | Freq: Four times a day (QID) | ORAL | Status: DC
  Administered 2013-05-12 – 2013-05-14 (×7): 150 mg via ORAL
  Filled 2013-05-12 (×14): qty 1

## 2013-05-12 NOTE — Progress Notes (Signed)
[name] [name]   DOB:09/02/1955   [id number]   QMV#:[id number]  Subjective: patient eating breakfast in recliner; wife in room; discussed continuing Ca++ elevation as possible cause of patient's confusion; discussed possible NHL as explanation for calcium, platelet and LDH abnormalities; discussed need for tissue Bx for definitive Dx   Objective: middle aged White male exmained in recliner Filed Vitals:   05/12/13 0914  BP: 116/68  Pulse: 98  Temp:   Resp: 18    Body mass index is 30.79 kg/(m^2).  Intake/Output Summary (Last 24 hours) at 05/12/13 0931 Last data filed at 05/12/13 0800  Gross per 24 hour  Intake   1680 ml  Output    500 ml  Net   1180 ml   Exam unchanged from yesterday   CBG (last 3)   Recent Labs  05/11/13 1738 05/11/13 2102 05/12/13 0728  GLUCAP 112* 120* 106*     Labs:  Results for [name], [name] (MRN [id number]) as of 05/12/2013 09:30  Ref. Range 05/06/2013 21:00 05/12/2013 03:32  LDH Latest Range: 94-250 U/L 3192 (H) 3045 (H)    Lab Results  Component Value Date   WBC 4.2 05/12/2013   HGB 12.9* 05/12/2013   HCT 37.6* 05/12/2013   MCV 96.2 05/12/2013   PLT 61* 05/12/2013   NEUTROABS 2.1 05/12/2013    @LASTCHEMISTRY @  Urine Studies No results found for this basename: UACOL, UAPR, USPG, UPH, UTP, UGL, UKET, UBIL, UHGB, UNIT, UROB, ULEU, UEPI, UWBC, URBC, UBAC, CAST, CRYS, UCOM, BILUA,  in the last 72 hours  Basic Metabolic Panel:  Recent Labs Lab 05/06/13 0821 05/06/13 1948 05/06/13 2100  05/07/13 0810  05/08/13 0341 05/08/13 1610  05/09/13 1257 05/09/13 2140 05/10/13 0345 05/11/13 0505 05/12/13 0849  NA 116*  --  114*  < > 119*  < > 121* 118*  < > 126* 125* 131* 127* 126*  K 5.4*  --  4.9  < > 5.3  < > 4.5 4.4  --   --   --  4.9 4.4 5.9*  CL 78*  --  79*  < > 82*  < > 86* 83*  --   --   --  89* 87* 86*  CO2 27  --  22  < > 27  < > 24 23  --   --   --  30 27 26   GLUCOSE 113*  --  115*  < > 99  < > 90 110*  --   --   --  104* 102* 108*  BUN 12   --  10  < > 12  < > 12 13  --   --   --  14 14 20   CREATININE 0.9  --  0.63  < > 0.73  < > 0.73 0.63  --   --   --  0.83 0.71 0.78  CALCIUM 11.1*  --  10.7*  < > 10.5  < > 10.5 10.3  --   --   --  11.8* 11.5* 11.4*  MG  --   --  1.3*  --  2.2  --   --   --   --   --   --   --   --   --   PHOS  --  5.4*  --   --   --   --   --   --   --   --   --   --   --   --   < > =  values in this interval not displayed. GFR Estimated Creatinine Clearance: 98.1 ml/min (by C-G formula based on Cr of 0.78). Liver Function Tests:  Recent Labs Lab 05/06/13 0821 05/06/13 2100 05/07/13 0810 05/08/13 0341 05/09/13 0334  AST 78* 104* 83* 55* 59*  ALT 74* 55* 46 34 33  ALKPHOS 290* 340* 317* 256* 245*  BILITOT 0.9 0.9 0.7 0.8 0.8  PROT 7.4 6.5 6.0 5.6* 5.9*  ALBUMIN 3.1* 2.5* 2.3* 2.2* 2.3*   No results found for this basename: LIPASE, AMYLASE,  in the last 168 hours No results found for this basename: AMMONIA,  in the last 168 hours Coagulation profile  Recent Labs Lab 05/11/13 0505 05/11/13 1055  INR 1.31 1.29    CBC:  Recent Labs Lab 05/06/13 1730 05/07/13 0810 05/08/13 0341 05/11/13 0505 05/11/13 1055 05/12/13 0332  WBC 4.2 3.4* 3.4* 4.0  --  4.2  NEUTROABS 2.0  --   --   --   --  2.1  HGB 14.3 13.1 11.8* 12.2*  --  12.9*  HCT 39.3 36.4* 33.7* 35.9*  --  37.6*  MCV 92.9 92.9 94.7 97.0  --  96.2  PLT 84* 74* 66* 61* 60* 61*   Cardiac Enzymes: No results found for this basename: CKTOTAL, CKMB, CKMBINDEX, TROPONINI,  in the last 168 hours BNP: No components found with this basename: POCBNP,  CBG:  Recent Labs Lab 05/11/13 0813 05/11/13 1228 05/11/13 1738 05/11/13 2102 05/12/13 0728  GLUCAP 83 113* 112* 120* 106*   D-Dimer  Recent Labs  05/11/13 1055  DDIMER >20.00*   Hgb A1c No results found for this basename: HGBA1C,  in the last 72 hours Lipid Profile No results found for this basename: CHOL, HDL, LDLCALC, TRIG, CHOLHDL, LDLDIRECT,  in the last 72  hours Thyroid function studies No results found for this basename: TSH, T4TOTAL, FREET3, T3FREE, THYROIDAB,  in the last 72 hours Anemia work up No results found for this basename: VITAMINB12, FOLATE, FERRITIN, TIBC, IRON, RETICCTPCT,  in the last 72 hours Microbiology Recent Results (from the past 240 hour(s))  MRSA PCR SCREENING     Status: None   Collection Time    05/06/13  8:08 PM      Result Value Ref Range Status   MRSA by PCR NEGATIVE  NEGATIVE Final   Comment:            The GeneXpert MRSA Assay (FDA     approved for NASAL specimens     only), is one component of a     comprehensive MRSA colonization     surveillance program. It is not     intended to diagnose MRSA     infection nor to guide or     monitor treatment for     MRSA infections.      Studies:  Ct Entero Abd/pelvis W/cm  05/11/2013   CLINICAL DATA:  Elevated liver function studies with multiple liver lesions and possible right ileocolonic mesenteric mass on MRI.  EXAM: CT ABDOMEN AND PELVIS WITH CONTRAST (ENTEROGRAPHY)  TECHNIQUE: Multidetector CT of the abdomen and pelvis during bolus administration of intravenous contrast. Negative oral contrast VoLumen was given.  CONTRAST:  158mL OMNIPAQUE IOHEXOL 300 MG/ML  SOLN  COMPARISON:  MR ABDOMEN WO/W CM dated 05/10/2013; US ABDOMEN COMPLETE dated 05/08/2013  FINDINGS: Patchy dependent opacity at the left lung base likely represents atelectasis. The right lung base is clear. There is no significant pleural or pericardial effusion. Bilateral gynecomastia noted.  The liver  lesions demonstrated by MRI are subtle on CT and not further characterized. Hepatic contours remain mildly irregular, consistent with underlying cirrhosis. The spleen is normal in size. There is a 8 mm low-density lesion centrally, likely an incidental hemangioma. The gallbladder, biliary system and pancreas appear normal.  There is no adrenal mass. Both kidneys appear normal. There is no hydronephrosis.  The  stomach appears unremarkable aside from apparent peristalsis in the pre pyloric region, not clearly demonstrated on MRI. No focal mucosal abnormalities are identified within the small bowel or colon. The colon is stool-filled. There is edema throughout the mesenteric fat. No focal nodularity, calcifications or masses seen within the ileocolonic mesentery as suggested on MRI. The appendix appears normal. However, there are several enlarged lymph nodes along the right iliac vessels, including a dominant nodal mass superiorly in the right groin, measuring up to 3.8 x 4.9 cm on image 70. Prominent lymph nodes within the porta hepatis are again noted, likely related to underlying liver disease.  There is mild aortoiliac atherosclerosis. The urinary bladder appears normal. The prostate gland is not significantly enlarged. There is no perirectal adenopathy.  There are no worrisome osseous findings.  IMPRESSION: 1. Dominant nodal mass in the right external iliac chain with additional smaller lymph nodes along the right iliac vessels. No primary small bowel, colonic or mesenteric lesion is identified to suggest underlying carcinoid tumor or adenocarcinoma. These findings are presumably unrelated the liver disease and worrisome for possible lymphoma. Biopsy of the right iliac nodal mass recommended. 2. No evidence of bowel obstruction or extraluminal fluid collection. Mild edema throughout the mesenteric fat is nonspecific although likely related to the patient's liver disease. 3. The liver lesions seen on MRI are not further characterized. Please refer to separate report for follow up recommendations.   Electronically Signed   By: Camie Patience M.D.   On: 05/11/2013 07:46    Assessment: 58 y.o. [place] man admitted with multiple electrolyte abnormalities possibly related to "diet pills"/ polydipsia/ spironolactone,  (1) unexplained moderate thrombocytopenia first noted 05/06/2013 (normal 05/01/2013), with normal  creatinine, PT/INR and PTT and no schistocytes on smear.  (2) Also with poorly characterized liver lesions which may be regenerating nodules in the presence of cirrhosis in this patient with documented history of ETOH abuse.  (3) R iliac adenopathy with a dominant nodal mass measuring 4.9 cm felt worrisome for lymphoma  Plan: platelets remain stable >60K with normal WBC/ diff and Hb levels-- as noted, DIC and TTP unlikely, HIT test in process. Calcium remains high despite hydration with PTH and ionized calcium pending. LDH still >3K. -- I feel these abnormalities may all be explained by an aggressive NHL and next step would be biopsy of the dominant mass noted on CT yesterday.   Entered order for IR to Bx nodal mass and for pamidronate.Discussed with wife Stanton Kidney and patient.  Will follow with you   Chauncey Cruel, MD 05/12/2013  9:31 AM

## 2013-05-12 NOTE — Progress Notes (Addendum)
[facility] PROGRESS NOTE  [name] [name] WJX:[id number] DOB: 15-Sep-1955 DOA: 05/06/2013 PCP: [name], MD Interim Summary:  [name] [name] is a 58 y.o. male  With history of hypertension, peptic ulcer disease, gastroesophageal reflux disease, diet-controlled type 2 diabetes, hyperlipidemia who had had an extensive chest pain workup during his last hospitalization from 04/23/2013 to 04/25/2013 which was negative for cardiac etiology and negative for PE. Patient had presented for upper endoscopy for GI evaluation of his chest pain when labs reviewed showed a continued decline in his sodium to 116 with a potassium of 5.4 and a calcium of 11.1, platelets of 80,000 and elevated liver function tests. Patient was subsequently sent from the endoscopy suite to the emergency room to be admitted for further evaluation and management.  ON admission he was hypovolemic on admission, started on fluids and his sodium improved to 121. He became euvolemic but his sodium remained low. He was started on tolvaptan on 4/1 and as his sodium improved to 131 ,it was discontinued on 4/3 as per protocol. TSH and cortisol level within normal limits. His lisinopril and spironolactone were discontinued in view of his hyperkalemia.  He also reported that he has been drinking alcohol ( 8 beers) every day.  Meanwhile his thrombocytopenia worsened and hematology consulted for further recommendations.  He was worked up for elevated LFTS ,with an ultrasound of the abdomen, revealed some hypoechoic lesions in the liver. It was followed up MRI abdomen and CT abdomen , showing multiple liver lesions and right inguinal lymphadenopathy. Because of his hypercalcemia, elevated LDH, thrombocytopenia, we will get a biopsy of the right inguinal LN to evaluate for lymphoma.   Assessment/Plan: Hyponatremia -Hypovolemic on admission, seems to be euvolemic at present. -Correcting at adequate pace with NS. SODIUM down to 127 from 131 after  tolvaptan was stopped.  -started him on tolvaptan on 4/1 and discontinued on 4/3.  -Also takes a large amount of free water daily. -TSH/cortisol ok. -Dc spironolactone indefinitely.  Hyperkalemia -Off lisinopril/spironolactone. -Corrected with IVF.  Hypercalcemia -Suspect related to dehydration. -recheck CMET in am.  Dysphagia -For EGD once electrolyte abnormalities corrected.  Orthostatic Hypotension -Resolved.  Transaminitis, mild -AST/Alk phos only. -?closet drinker. This could also explain his severe hyponatremia.  -Acute hepatitis panel negative. -Abdominal US negative.    Abnormal MRI of the abdomen/ CT ABD: revealing multiple liver lesions and abnormal area of mesentery. CT abd and pelvis ordered for further evaluation.  CT showed inguiinal lymphadenopathy, and IR consulted for biopsy. He is going tomorrow for right inguinal lymphadenopathy.  Thrombocytopenia:  - unclear etiology and hematology consulted for further input.  - appreciate hematology recommendations.  - HIT panel pending.   DM II -Well controlled.  Hypercalcemia: Unclear etiology. rpeat level in am is persistently high.  - ? Lymphoma, in view of his enlarged LN.  - pamidronate ordered by hematology.  - repeat value in am.    Intermittent confusion/ Lethargy: - possibly from electrolyte abnormalities .  - continue to monitor.   Fever:  - blood cultures will be ordered. Normal WBC count. CXR and UA will be ordered.    DVT prophylaxis. SCD'S Code Status: Full Code Family Communication: Patient only discussed with wife over the phone.  Disposition Plan: Home when ready   Consultants:  GI  Ir   Hematology/oncology    Antibiotics:  None   Subjective: No complaints, denies any sob, cp, or abdominal pain.    Objective: Filed Vitals:   05/11/13 2104 05/12/13 2130  05/12/13 0914 05/12/13 1323  BP: 113/57 147/74 116/68 112/64  Pulse: 83 107 98 95  Temp: 98.9 F (37.2 C) 97.6 F  (36.4 C)  100.3 F (37.9 C)  TempSrc: Oral Oral  Oral  Resp: $Remo'16 18 18 17  'DGCsG$ Height:      Weight:  81.4 kg (179 lb 7.3 oz)    SpO2: 93% 96% 95% 93%    Intake/Output Summary (Last 24 hours) at 05/12/13 1837 Last data filed at 05/12/13 1821  Gross per 24 hour  Intake 1466.67 ml  Output    875 ml  Net 591.67 ml   Filed Weights   05/10/13 0506 05/11/13 0447 05/12/13 0432  Weight: 83.684 kg (184 lb 7.8 oz) 81.693 kg (180 lb 1.6 oz) 81.4 kg (179 lb 7.3 oz)    Exam:   General:  AA Ox3  Cardiovascular: RRR  Respiratory: CTA B  Abdomen: S/NT/ND/+BS  Extremities: no C/C/E   Neurologic:  Non-focal.  Data Reviewed: Basic Metabolic Panel:  Recent Labs Lab 05/06/13 0821 05/06/13 1948 05/06/13 2100  05/07/13 0810  05/08/13 0341 05/08/13 1610  05/09/13 1257 05/09/13 2140 05/10/13 0345 05/11/13 0505 05/12/13 0849  NA 116*  --  114*  < > 119*  < > 121* 118*  < > 126* 125* 131* 127* 126*  K 5.4*  --  4.9  < > 5.3  < > 4.5 4.4  --   --   --  4.9 4.4 5.9*  CL 78*  --  79*  < > 82*  < > 86* 83*  --   --   --  89* 87* 86*  CO2 27  --  22  < > 27  < > 24 23  --   --   --  $R'30 27 26  'Gd$ GLUCOSE 113*  --  115*  < > 99  < > 90 110*  --   --   --  104* 102* 108*  BUN 12  --  10  < > 12  < > 12 13  --   --   --  $R'14 14 20  'jm$ CREATININE 0.9  --  0.63  < > 0.73  < > 0.73 0.63  --   --   --  0.83 0.71 0.78  CALCIUM 11.1*  --  10.7*  < > 10.5  < > 10.5 10.3  --   --   --  11.8* 11.5* 11.4*  MG  --   --  1.3*  --  2.2  --   --   --   --   --   --   --   --   --   PHOS  --  5.4*  --   --   --   --   --   --   --   --   --   --   --   --   < > = values in this interval not displayed. Liver Function Tests:  Recent Labs Lab 05/06/13 0821 05/06/13 2100 05/07/13 0810 05/08/13 0341 05/09/13 0334  AST 78* 104* 83* 55* 59*  ALT 74* 55* 46 34 33  ALKPHOS 290* 340* 317* 256* 245*  BILITOT 0.9 0.9 0.7 0.8 0.8  PROT 7.4 6.5 6.0 5.6* 5.9*  ALBUMIN 3.1* 2.5* 2.3* 2.2* 2.3*   No results found for  this basename: LIPASE, AMYLASE,  in the last 168 hours No results found for this basename: AMMONIA,  in the last 168  hours CBC:  Recent Labs Lab 05/06/13 1730 05/07/13 0810 05/08/13 0341 05/11/13 0505 05/11/13 1055 05/12/13 0332  WBC 4.2 3.4* 3.4* 4.0  --  4.2  NEUTROABS 2.0  --   --   --   --  2.1  HGB 14.3 13.1 11.8* 12.2*  --  12.9*  HCT 39.3 36.4* 33.7* 35.9*  --  37.6*  MCV 92.9 92.9 94.7 97.0  --  96.2  PLT 84* 74* 66* 61* 60* 61*   Cardiac Enzymes: No results found for this basename: CKTOTAL, CKMB, CKMBINDEX, TROPONINI,  in the last 168 hours BNP (last 3 results) No results found for this basename: PROBNP,  in the last 8760 hours CBG:  Recent Labs Lab 05/11/13 1738 05/11/13 2102 05/12/13 0728 05/12/13 1151 05/12/13 1643  GLUCAP 112* 120* 106* 124* 97    Recent Results (from the past 240 hour(s))  MRSA PCR SCREENING     Status: None   Collection Time    05/06/13  8:08 PM      Result Value Ref Range Status   MRSA by PCR NEGATIVE  NEGATIVE Final   Comment:            The GeneXpert MRSA Assay (FDA     approved for NASAL specimens     only), is one component of a     comprehensive MRSA colonization     surveillance program. It is not     intended to diagnose MRSA     infection nor to guide or     monitor treatment for     MRSA infections.     Studies: Ct Entero Abd/pelvis W/cm  05/11/2013   CLINICAL DATA:  Elevated liver function studies with multiple liver lesions and possible right ileocolonic mesenteric mass on MRI.  EXAM: CT ABDOMEN AND PELVIS WITH CONTRAST (ENTEROGRAPHY)  TECHNIQUE: Multidetector CT of the abdomen and pelvis during bolus administration of intravenous contrast. Negative oral contrast VoLumen was given.  CONTRAST:  OMNIPAQUE IOHEXOL 300 MG/ML  SOLN  COMPARISON:  MR ABDOMEN WO/W CM dated 05/10/2013; US ABDOMEN COMPLETE dated 05/08/2013  FINDINGS: Patchy dependent opacity at the left lung base likely represents atelectasis. The right lung  base is clear. There is no significant pleural or pericardial effusion. Bilateral gynecomastia noted.  The liver lesions demonstrated by MRI are subtle on CT and not further characterized. Hepatic contours remain mildly irregular, consistent with underlying cirrhosis. The spleen is normal in size. There is a 8 mm low-density lesion centrally, likely an incidental hemangioma. The gallbladder, biliary system and pancreas appear normal.  There is no adrenal mass. Both kidneys appear normal. There is no hydronephrosis.  The stomach appears unremarkable aside from apparent peristalsis in the pre pyloric region, not clearly demonstrated on MRI. No focal mucosal abnormalities are identified within the small bowel or colon. The colon is stool-filled. There is edema throughout the mesenteric fat. No focal nodularity, calcifications or masses seen within the ileocolonic mesentery as suggested on MRI. The appendix appears normal. However, there are several enlarged lymph nodes along the right iliac vessels, including a dominant nodal mass superiorly in the right groin, measuring up to 3.8 x 4.9 cm on image 70. Prominent lymph nodes within the porta hepatis are again noted, likely related to underlying liver disease.  There is mild aortoiliac atherosclerosis. The urinary bladder appears normal. The prostate gland is not significantly enlarged. There is no perirectal adenopathy.  There are no worrisome osseous findings.  IMPRESSION: 1. Dominant nodal mass  in the right external iliac chain with additional smaller lymph nodes along the right iliac vessels. No primary small bowel, colonic or mesenteric lesion is identified to suggest underlying carcinoid tumor or adenocarcinoma. These findings are presumably unrelated the liver disease and worrisome for possible lymphoma. Biopsy of the right iliac nodal mass recommended. 2. No evidence of bowel obstruction or extraluminal fluid collection. Mild edema throughout the mesenteric fat  is nonspecific although likely related to the patient's liver disease. 3. The liver lesions seen on MRI are not further characterized. Please refer to separate report for follow up recommendations.   Electronically Signed   By: Camie Patience M.D.   On: 05/11/2013 07:46    Scheduled Meds: . insulin aspart  0-15 Units Subcutaneous TID WC  . metoprolol succinate  100 mg Oral Daily  . pantoprazole  80 mg Oral Q1200  . sodium chloride  3 mL Intravenous Q12H   Continuous Infusions:    Principal Problem:   Hyponatremia Active Problems:   Type II or unspecified type diabetes mellitus with neurological manifestations, not stated as uncontrolled(250.60)   HYPERLIPIDEMIA   GOUT   HYPERTENSION   GERD   PEPTIC ULCER DISEASE   Diabetic neuropathy   Elevated LFTs   Other dysphagia   Chest pain   Hyperkalemia   Thrombocytopenia   Hypercalcemia   Orthostasis    Time spent: 35 minutes.     [facility] Pager [phone]  If 7PM-7AM, please contact night-coverage at [email or web address], password [facility] 05/12/2013, 6:37 PM  LOS: 6 days

## 2013-05-12 NOTE — Progress Notes (Signed)
Pt wife and dtr at bedside. Verbalizing concern re: pt's persistent confusion, drowsiness, and weakness. Dr Karleen Hampshire aware and will address.

## 2013-05-13 ENCOUNTER — Inpatient Hospital Stay (HOSPITAL_COMMUNITY): Payer: No Typology Code available for payment source

## 2013-05-13 ENCOUNTER — Encounter (HOSPITAL_COMMUNITY): Payer: Self-pay | Admitting: Gastroenterology

## 2013-05-13 LAB — URINALYSIS, ROUTINE W REFLEX MICROSCOPIC
BILIRUBIN URINE: NEGATIVE
GLUCOSE, UA: NEGATIVE mg/dL
Hgb urine dipstick: NEGATIVE
KETONES UR: NEGATIVE mg/dL
Leukocytes, UA: NEGATIVE
Nitrite: NEGATIVE
Protein, ur: NEGATIVE mg/dL
Specific Gravity, Urine: 1.015 (ref 1.005–1.030)
Urobilinogen, UA: 1 mg/dL (ref 0.0–1.0)
pH: 5 (ref 5.0–8.0)

## 2013-05-13 LAB — BASIC METABOLIC PANEL
BUN: 22 mg/dL (ref 6–23)
CHLORIDE: 88 meq/L — AB (ref 96–112)
CO2: 25 meq/L (ref 19–32)
CREATININE: 0.84 mg/dL (ref 0.50–1.35)
Calcium: 10.6 mg/dL — ABNORMAL HIGH (ref 8.4–10.5)
GFR calc Af Amer: >90 mL/min (ref >90)
GFR calc non Af Amer: >90 mL/min (ref >90)
GLUCOSE: 172 mg/dL — AB (ref 70–99)
Potassium: 4.1 mEq/L (ref 3.7–5.3)
Sodium: 128 mEq/L — ABNORMAL LOW (ref 137–147)

## 2013-05-13 LAB — CBC
HCT: 39.2 % (ref 39.0–52.0)
Hemoglobin: 13.4 g/dL (ref 13.0–17.0)
MCH: 33.3 pg (ref 26.0–34.0)
MCHC: 34.2 g/dL (ref 30.0–36.0)
MCV: 97.3 fL (ref 78.0–100.0)
PLATELETS: 50 10*3/uL — AB (ref 150–400)
RBC: 4.03 MIL/uL — ABNORMAL LOW (ref 4.22–5.81)
RDW: 12.9 % (ref 11.5–15.5)
WBC: 4 10*3/uL (ref 4.0–10.5)

## 2013-05-13 LAB — GLUCOSE, CAPILLARY
GLUCOSE-CAPILLARY: 163 mg/dL — AB (ref 70–99)
GLUCOSE-CAPILLARY: 104 mg/dL — AB (ref 70–99)
Glucose-Capillary: 127 mg/dL — ABNORMAL HIGH (ref 70–99)
Glucose-Capillary: 105 mg/dL — ABNORMAL HIGH (ref 70–99)

## 2013-05-13 LAB — CALCIUM, IONIZED: Calcium, Ion: 1.46 mmol/L — ABNORMAL HIGH (ref 1.12–1.23)

## 2013-05-13 MED ORDER — MIDAZOLAM HCL 2 MG/2ML IJ SOLN
INTRAMUSCULAR | Status: AC
  Filled 2013-05-13: qty 2

## 2013-05-13 MED ORDER — POLYETHYLENE GLYCOL 3350 17 G PO PACK
17.0000 g | PACK | Freq: Every day | ORAL | Status: DC
  Administered 2013-05-13 – 2013-05-14 (×2): 17 g via ORAL
  Filled 2013-05-13 (×10): qty 1

## 2013-05-13 MED ORDER — SENNOSIDES-DOCUSATE SODIUM 8.6-50 MG PO TABS
2.0000 | ORAL_TABLET | Freq: Two times a day (BID) | ORAL | Status: DC
  Administered 2013-05-13 – 2013-05-22 (×13): 2 via ORAL
  Filled 2013-05-13 (×20): qty 2

## 2013-05-13 MED ORDER — BISACODYL 5 MG PO TBEC
5.0000 mg | DELAYED_RELEASE_TABLET | Freq: Every day | ORAL | Status: DC | PRN
  Administered 2013-05-13: 5 mg via ORAL
  Filled 2013-05-13: qty 1

## 2013-05-13 MED ORDER — SODIUM CHLORIDE 0.9 % IV SOLN
INTRAVENOUS | Status: DC
  Administered 2013-05-13 – 2013-05-14 (×4): via INTRAVENOUS
  Administered 2013-05-15: 75 mL/h via INTRAVENOUS
  Administered 2013-05-15 – 2013-05-21 (×8): via INTRAVENOUS

## 2013-05-13 MED ORDER — MIDAZOLAM HCL 2 MG/2ML IJ SOLN
INTRAMUSCULAR | Status: AC
  Filled 2013-05-13: qty 6

## 2013-05-13 MED ORDER — FENTANYL CITRATE 0.05 MG/ML IJ SOLN
INTRAMUSCULAR | Status: AC | PRN
  Administered 2013-05-13: 50 ug via INTRAVENOUS

## 2013-05-13 MED ORDER — FENTANYL CITRATE 0.05 MG/ML IJ SOLN
INTRAMUSCULAR | Status: AC
  Filled 2013-05-13: qty 6

## 2013-05-13 MED ORDER — MIDAZOLAM HCL 2 MG/2ML IJ SOLN
INTRAMUSCULAR | Status: AC | PRN
  Administered 2013-05-13: 1 mg via INTRAVENOUS

## 2013-05-13 MED ORDER — FENTANYL CITRATE 0.05 MG/ML IJ SOLN
INTRAMUSCULAR | Status: AC
  Filled 2013-05-13: qty 2

## 2013-05-13 NOTE — Progress Notes (Signed)
Pt confused, very restless this AM. Pt repeatedly attempting OOB, feels constipated and wants to go to BR every 5-10 minutes.  Agitated that he is NPO for procedure this AM.  Very unsteady gait. Has just small smears of BM. Bed alarm on. Dr Karleen Hampshire paged and made aware. Orders for laxatives entered in Epic. Pt medicated w/ PRN morphine for c/o left groin pain. Awaiting US guided bx this AM.

## 2013-05-13 NOTE — Procedures (Signed)
Technically successful US guided biopsy of right external iliac chain lymph node/mass.  No immediate complications.

## 2013-05-13 NOTE — Progress Notes (Signed)
[facility] PROGRESS NOTE  [name] [name] YIA:[id number] DOB: 08-19-1955 DOA: 05/06/2013 PCP: [name], MD Interim Summary:  [name] [name] is a 58 y.o. male  With history of hypertension, peptic ulcer disease, gastroesophageal reflux disease, diet-controlled type 2 diabetes, hyperlipidemia who had had an extensive chest pain workup during his last hospitalization from 04/23/2013 to 04/25/2013 which was negative for cardiac etiology and negative for PE. Patient had presented for upper endoscopy for GI evaluation of his chest pain when labs reviewed showed a continued decline in his sodium to 116 with a potassium of 5.4 and a calcium of 11.1, platelets of 80,000 and elevated liver function tests. Patient was subsequently sent from the endoscopy suite to the emergency room to be admitted for further evaluation and management.  ON admission he was hypovolemic on admission, started on fluids and his sodium improved to 121. He became euvolemic but his sodium remained low. He was started on tolvaptan on 4/1 and as his sodium improved to 131 ,it was discontinued on 4/3 as per protocol. TSH and cortisol level within normal limits. His lisinopril and spironolactone were discontinued in view of his hyperkalemia.  He also reported that he has been drinking alcohol ( 8 beers) every day.  Meanwhile his thrombocytopenia worsened and hematology consulted for further recommendations.  He was worked up for elevated LFTS ,with an ultrasound of the abdomen, revealed some hypoechoic lesions in the liver. It was followed up with  MRI abdomen and CT abdomen , showing multiple liver lesions and right iliac lymphadenopathy. In view of his hypercalcemia, elevated LDH, thrombocytopenia, he underwent a US guided biopsy of the external iliac lymph node/ mass on 05/13/13. Over the last 24 hours his confusion has worsened and he was also slightly agitated.  We are getting an MRI of the brain to evaluate for any acute pathology.     Assessment/Plan: Hyponatremia -Hypovolemic on admission, seems to be euvolemic at present. As his sodium did not improve with NS fluids,  we have started him on tolvaptan on 4/1 and discontinued on 4/3 as his sodium improved to 131. But his sodium dropped to 124 in the last 3 days off tolvaptan.  He is getting more confused over the last 24 hours which could be from the low sodium. He was started on demeclocycline, without much improvement. An MRI of the brain is also ordered.  If his repeat BMP does not show improvement in the sodium, we might have to restart him on tolvaptan.  -TSH/cortisol ok.   Hyperkalemia -Off lisinopril/spironolactone. -Corrected with IVF.  Hypercalcemia -Suspect related to dehydration, but with his thrombocytopenia, external iliac lymph node enlargement, elevated LDH, we will rule out a lymphoma with a biopsy. He was started on NS fluids andhis hypercalcemia temporarily improved, but is back high even after a dose of pamidronate. We will restart him on NS fluids and his po intake is minimal.  -recheck CMET in am.  Dysphagia Underwent EGD ON 4/4 showed minimal gastritis.   Orthostatic Hypotension -Resolved.  Transaminitis, mild -AST/Alk phos only. -?closet drinker. This could also explain his severe hyponatremia.  -Acute hepatitis panel negative. -Abdominal US negative.    Abnormal MRI of the abdomen/ CT ABD: revealing multiple liver lesions and abnormal area of mesentery. CT abd and pelvis ordered for further evaluation.  CT showed external iliac  lymphadenopathy, and IR consulted for biopsy. He underwent biopsy of the  right external iliac lymphadenopathy.  Thrombocytopenia:  - worsening. No obvious signs of bleeding.  -  unclear etiology and hematology consulted for further input.  - appreciate hematology recommendations.  -unlikely DIC or HUS, with normal creatinine.  HIT panel pending.    DM II -Well controlled. - CBG (last 3)   Recent Labs   05/12/13 1643 05/12/13 2101 05/13/13 0748  GLUCAP 97 114* 127*    Intermittent confusion/ Lethargy: - possibly from electrolyte abnormalities . MRI brain ordered.  - continue to monitor.   Fever:  - blood cultures will be ordered. Normal WBC count. CXR and UA negative for infection. He denies any abdominal pain or headache.   Constipation: -s tool softeners added.   DVT prophylaxis. SCD'S Code Status: Full Code Family Communication: Patient only discussed with wife over the phone.  Disposition Plan: Home when ready   Consultants:  GI  Ir   Hematology/oncology    Antibiotics:  None   Subjective: No complaints, denies any sob, cp, or abdominal pain.    Objective: Filed Vitals:   05/13/13 1026 05/13/13 1028 05/13/13 1031 05/13/13 1100  BP: 108/71 110/69 110/64 125/72  Pulse: 109 109 107 107  Temp:    98.1 F (36.7 C)  TempSrc:    Oral  Resp: _0 Height:      Weight:      SpO2: 94% 96% 96% 96%    Intake/Output Summary (Last 24 hours) at 05/13/13 1125 Last data filed at 05/13/13 1100  Gross per 24 hour  Intake    480 ml  Output   1100 ml  Net   -620 ml   Filed Weights   05/11/13 0447 05/12/13 0432 05/13/13 0404  Weight: 81.693 kg (180 lb 1.6 oz) 81.4 kg (179 lb 7.3 oz) 81.2 kg (179 lb 0.2 oz)    Exam:   General:  AA Ox3  Cardiovascular: RRR  Respiratory: CTA B  Abdomen: S/NT/ND/+BS  Extremities: no C/C/E   Neurologic:  Non-focal.  Data Reviewed: Basic Metabolic Panel:  Recent Labs Lab 05/06/13 1948 05/06/13 2100  05/07/13 0810  05/08/13 1610  05/09/13 2140 05/10/13 0345 05/11/13 0505 05/12/13 0849 05/12/13 1912  NA  --  114*  < > 119*  < > 118*  < > 125* 131* 127* 126* 124*  K  --  4.9  < > 5.3  < > 4.4  --   --  4.9 4.4 5.9* 4.3  CL  --  79*  < > 82*  < > 83*  --   --  89* 87* 86* 85*  CO2  --  22  < > 27  < > 23  --   --  _1 GLUCOSE  --  115*  < > 99  < > 110*  --   --  104* 102* 108* 162*  BUN  --  10   < > 12  < > 13  --   --  _2 CREATININE  --  0.63  < > 0.73  < > 0.63  --   --  0.83 0.71 0.78 0.78  CALCIUM  --  10.7*  < > 10.5  < > 10.3  --   --  11.8* 11.5* 11.4* 10.8*  MG  --  1.3*  --  2.2  --   --   --   --   --   --   --   --   PHOS 5.4*  --   --   --   --   --   --   --   --   --   --   --   < > =  values in this interval not displayed. Liver Function Tests:  Recent Labs Lab 05/06/13 2100 05/07/13 0810 05/08/13 0341 05/09/13 0334  AST 104* 83* 55* 59*  ALT 55* 46 34 33  ALKPHOS 340* 317* 256* 245*  BILITOT 0.9 0.7 0.8 0.8  PROT 6.5 6.0 5.6* 5.9*  ALBUMIN 2.5* 2.3* 2.2* 2.3*   No results found for this basename: LIPASE, AMYLASE,  in the last 168 hours No results found for this basename: AMMONIA,  in the last 168 hours CBC:  Recent Labs Lab 05/06/13 1730 05/07/13 0810 05/08/13 0341 05/11/13 0505 05/11/13 1055 05/12/13 0332 05/13/13 0459  WBC 4.2 3.4* 3.4* 4.0  --  4.2 4.0  NEUTROABS 2.0  --   --   --   --  2.1  --   HGB 14.3 13.1 11.8* 12.2*  --  12.9* 13.4  HCT 39.3 36.4* 33.7* 35.9*  --  37.6* 39.2  MCV 92.9 92.9 94.7 97.0  --  96.2 97.3  PLT 84* 74* 66* 61* 60* 61* 50*   Cardiac Enzymes: No results found for this basename: CKTOTAL, CKMB, CKMBINDEX, TROPONINI,  in the last 168 hours BNP (last 3 results) No results found for this basename: PROBNP,  in the last 8760 hours CBG:  Recent Labs Lab 05/12/13 0728 05/12/13 1151 05/12/13 1643 05/12/13 2101 05/13/13 0748  GLUCAP 106* 124* 97 114* 127*    Recent Results (from the past 240 hour(s))  MRSA PCR SCREENING     Status: None   Collection Time    05/06/13  8:08 PM      Result Value Ref Range Status   MRSA by PCR NEGATIVE  NEGATIVE Final   Comment:            The GeneXpert MRSA Assay (FDA     approved for NASAL specimens     only), is one component of a     comprehensive MRSA colonization     surveillance program. It is not     intended to diagnose MRSA     infection nor to guide or       monitor treatment for     MRSA infections.     Studies: Dg Chest 2 View  05/12/2013   CLINICAL DATA:  Chest pain.  EXAM: CHEST  2 VIEW  COMPARISON:  05/06/2013.  FINDINGS: The cardiac silhouette, mediastinal and hilar contours are within normal limits and stable. The lungs are clear. Slightly low lung volumes with vascular crowding and streaky atelectasis. A small pleural effusion is suspected.  IMPRESSION: Low lung volumes with vascular crowding atelectasis and probable small left pleural effusion.   Electronically Signed   By: Kalman Jewels M.D.   On: 05/12/2013 20:19    Scheduled Meds: . demeclocycline  150 mg Oral 4 times per day  . fentaNYL      . insulin aspart  0-15 Units Subcutaneous TID WC  . metoprolol succinate  100 mg Oral Daily  . midazolam      . pantoprazole  80 mg Oral Q1200  . polyethylene glycol  17 g Oral Daily  . senna-docusate  2 tablet Oral BID  . sodium chloride  3 mL Intravenous Q12H   Continuous Infusions:    Principal Problem:   Hyponatremia Active Problems:   Type II or unspecified type diabetes mellitus with neurological manifestations, not stated as uncontrolled(250.60)   HYPERLIPIDEMIA   GOUT   HYPERTENSION   GERD   PEPTIC ULCER DISEASE   Diabetic neuropathy  Elevated LFTs   Other dysphagia   Chest pain   Hyperkalemia   Thrombocytopenia   Hypercalcemia   Orthostasis    Time spent: 35 minutes.     [facility] Pager [phone]  If 7PM-7AM, please contact night-coverage at [email or web address], password [facility] 05/13/2013, 11:25 AM  LOS: 7 days

## 2013-05-13 NOTE — Care Management Note (Addendum)
    Page 1 of 2   05/16/2013     11:21:59 AM   CARE MANAGEMENT NOTE 05/16/2013  Patient:  ARMAN, LOY   Account Number:  [id number]  Date Initiated:  05/08/2013  Documentation initiated by:  Gabriel Earing  Subjective/Objective Assessment:   PT ADMITTED WITH ABNORMAL LABS Na 116-126     Action/Plan:   FROM HOME   Anticipated DC Date:  05/17/2013   Anticipated DC Plan:  [place]  CM consult      Choice offered to / List presented to:             Status of service:  In process, will continue to follow Medicare Important Message given?  NA - LOS <3 / Initial given by admissions (If response is "NO", the following Medicare IM given date fields will be blank) Date Medicare IM given:   Date Additional Medicare IM given:    Discharge Disposition:    Per UR Regulation:  Reviewed for med. necessity/level of care/duration of stay  If discussed at [place] of Stay Meetings, dates discussed:   05/14/2013  05/16/2013    Comments:  05/16/13 Latronda Spink RN,BSN NCM [phone] FOR TRANSFER TO 3E-ONCOLOGY FOR STAGING,& INITIATION OF CHEMO.PATH-NHL.FOR PET SCAN.  05/14/13 Matayah Reyburn RN,BSN NCM [phone] WAIT L INGUINAL MASS BX RESULTS.FOR BONE MARROW BX.D/C PLAN HOME.  05/13/13 Shantanu Strauch RN,BSN NCM [phone] NA-124,CA-10.8,K 5.9,MAX T-101.CONFUSION,MRI BRAIN PEND.IVF@75 , MORPHINE 2MG  IV X2 DOSES.D/C PLAN HOME.  05/08/13 MMCGIBBONEY, RN, BSN Pt admitted with abnormal labs. NA 121 from home. Plan to dc home with no needs at present time.

## 2013-05-14 ENCOUNTER — Encounter (HOSPITAL_COMMUNITY): Payer: Self-pay | Admitting: Radiology

## 2013-05-14 LAB — COMPREHENSIVE METABOLIC PANEL
ALT: 30 U/L (ref 0–53)
AST: 82 U/L — ABNORMAL HIGH (ref 0–37)
Albumin: 2.5 g/dL — ABNORMAL LOW (ref 3.5–5.2)
Alkaline Phosphatase: 268 U/L — ABNORMAL HIGH (ref 39–117)
BILIRUBIN TOTAL: 0.8 mg/dL (ref 0.3–1.2)
BUN: 23 mg/dL (ref 6–23)
CHLORIDE: 87 meq/L — AB (ref 96–112)
CO2: 26 meq/L (ref 19–32)
Calcium: 9.9 mg/dL (ref 8.4–10.5)
Creatinine, Ser: 0.82 mg/dL (ref 0.50–1.35)
GFR calc Af Amer: >90 mL/min (ref >90)
Glucose, Bld: 102 mg/dL — ABNORMAL HIGH (ref 70–99)
Potassium: 3.8 mEq/L (ref 3.7–5.3)
SODIUM: 129 meq/L — AB (ref 137–147)
Total Protein: 6.2 g/dL (ref 6.0–8.3)

## 2013-05-14 LAB — CBC
HCT: 36.7 % — ABNORMAL LOW (ref 39.0–52.0)
Hemoglobin: 12.6 g/dL — ABNORMAL LOW (ref 13.0–17.0)
MCH: 33.1 pg (ref 26.0–34.0)
MCHC: 34.3 g/dL (ref 30.0–36.0)
MCV: 96.3 fL (ref 78.0–100.0)
PLATELETS: 38 10*3/uL — AB (ref 150–400)
RBC: 3.81 MIL/uL — ABNORMAL LOW (ref 4.22–5.81)
RDW: 12.9 % (ref 11.5–15.5)
WBC: 5 10*3/uL (ref 4.0–10.5)

## 2013-05-14 LAB — GLUCOSE, CAPILLARY
GLUCOSE-CAPILLARY: 99 mg/dL (ref 70–99)
Glucose-Capillary: 101 mg/dL — ABNORMAL HIGH (ref 70–99)
Glucose-Capillary: 100 mg/dL — ABNORMAL HIGH (ref 70–99)
Glucose-Capillary: 145 mg/dL — ABNORMAL HIGH (ref 70–99)

## 2013-05-14 MED ORDER — ENSURE PUDDING PO PUDG
1.0000 | Freq: Two times a day (BID) | ORAL | Status: DC
  Administered 2013-05-14 – 2013-05-16 (×3): 1 via ORAL
  Filled 2013-05-14 (×8): qty 1

## 2013-05-14 MED ORDER — DEMECLOCYCLINE HCL 150 MG PO TABS
150.0000 mg | ORAL_TABLET | ORAL | Status: DC
  Administered 2013-05-14 – 2013-05-19 (×22): 150 mg via ORAL
  Filled 2013-05-14 (×28): qty 1

## 2013-05-14 NOTE — Progress Notes (Signed)
NUTRITION FOLLOW UP  Intervention:   Recommend chocolate Ensure Pudding po BID, each supplement provides 170 kcal and 4 grams of protein   Nutrition Dx:   Predicted sub-optimal intake related to decreased appetite and excessive fluid intake AEB diet hx   Goal:   Intake of meals to meet >75% estimated needs-not met   Monitor:   Total protein/energy intake, labs, weights,  Assessment:   3/31: Patient admitted with severe hyponatremia. Patient states that prior to admit, his mouth would stay dry and he was drinking 2-3 12 ounce bottles of water every hour while awake (20-25 daily per patient}. HgbA1C 6.1 04/25/2013 and improved from last year. States that he has been trying to eat more healthfully recently. Some difficulty swallowing solids at times with hx or reflux noted  4/07: -Pt continues with hyponatremia, but improving per MD -Fluid restriction removed, currently on regular diet, eating approximately 25% of meals. Denied nausea/vomiting, just noted overall continued decreased appetite and early satiety. Has been trying to eat more, declined Ensure/Boost supplement as he was not sure he would be able to tolerate that amount of volume -Was willing to trial Ensure pudding, chocolate flavor only -Pt to be NPO later today for bone marrow biopsy to r/o NH lymphoma.  -Possible NHL dx and confusion from hyponatremia also likely contributing to poor PO -EGD on 4/04 indicated minimal gastritis. Pt denied any abd pain post meals or difficulty tolerating solid foods/regular diet texture -Weight decreased 9 lbs in one week, likely combination of poor PO and fluid status. Is +1682 ml  Height: Ht Readings from Last 1 Encounters:  05/06/13 _0  (1.626 m)    Weight Status:   Wt Readings from Last 1 Encounters:  05/13/13 179 lb 0.2 oz (81.2 kg)  05/06/13 188 lbs  Re-estimated needs:  Kcal: 1900-2000  Protein: 70-80 gm  Fluid: 1.9-2L daily  Skin: WDL  Diet Order:  General   Intake/Output Summary (Last 24 hours) at 05/14/13 1423 Last data filed at 05/14/13 0900  Gross per 24 hour  Intake 1682.5 ml  Output      0 ml  Net 1682.5 ml    Last BM: 4/07   Labs:   Recent Labs Lab 05/12/13 1912 05/13/13 1124 05/14/13 0630  NA 124* 128* 129*  K 4.3 4.1 3.8  CL 85* 88* 87*  CO2 _1 BUN _2 CREATININE 0.78 0.84 0.82  CALCIUM 10.8* 10.6* 9.9  GLUCOSE 162* 172* 102*    CBG (last 3)   Recent Labs  05/13/13 2157 05/14/13 0749 05/14/13 1149  GLUCAP 105* 101* 100*    Scheduled Meds: . demeclocycline  150 mg Oral 4 times per day  . insulin aspart  0-15 Units Subcutaneous TID WC  . metoprolol succinate  100 mg Oral Daily  . pantoprazole  80 mg Oral Q1200  . polyethylene glycol  17 g Oral Daily  . senna-docusate  2 tablet Oral BID  . sodium chloride  3 mL Intravenous Q12H    Continuous Infusions: . sodium chloride 75 mL/hr at 05/14/13 [place] LDN Clinical Dietitian [phone]

## 2013-05-14 NOTE — Progress Notes (Signed)
Subjective: IR received request for image guided bone marrow biopsy for thrombocytopenia. He denies any chest pain, shortness of breath or palpitations. He denies any active signs of bleeding or excessive bruising. He denies any recent fever or chills. He has previously tolerated sedation without complications.   Objective: Physical Exam: BP 107/60  Pulse 79  Temp(Src) 98.3 F (36.8 C) (Oral)  Resp 18  Ht $R'5\' 4"'mY$  (1.626 m)  Wt 179 lb 0.2 oz (81.2 kg)  BMI 30.71 kg/m2  SpO2 99%  General: A&Ox3, NAD, sitting up in bed. Heart: RRR with M/G/R Lungs: CTA bilaterally Abd: Soft, NT, ND, (+) BS  Labs: CBC  Recent Labs  05/13/13 0459 05/14/13 0630  WBC 4.0 5.0  HGB 13.4 12.6*  HCT 39.2 36.7*  PLT 50* 38*   BMET  Recent Labs  05/13/13 1124 05/14/13 0630  NA 128* 129*  K 4.1 3.8  CL 88* 87*  CO2 25 26  GLUCOSE 172* 102*  BUN 22 23  CREATININE 0.84 0.82  CALCIUM 10.6* 9.9   LFT  Recent Labs  05/14/13 0630  PROT 6.2  ALBUMIN 2.5*  AST 82*  ALT 30  ALKPHOS 268*  BILITOT 0.8   PT/INR No results found for this basename: LABPROT, INR,  in the last 72 hours   Studies/Results: Dg Chest 2 View  05/12/2013   CLINICAL DATA:  Chest pain.  EXAM: CHEST  2 VIEW  COMPARISON:  05/06/2013.  FINDINGS: The cardiac silhouette, mediastinal and hilar contours are within normal limits and stable. The lungs are clear. Slightly low lung volumes with vascular crowding and streaky atelectasis. A small pleural effusion is suspected.  IMPRESSION: Low lung volumes with vascular crowding atelectasis and probable small left pleural effusion.   Electronically Signed   By: Kalman Jewels M.D.   On: 05/12/2013 20:19   Mr Brain Wo Contrast  05/13/2013   CLINICAL DATA:  Intermittent confusion and lethargy.  EXAM: MRI HEAD WITHOUT CONTRAST  TECHNIQUE: Multiplanar, multiecho pulse sequences of the brain and surrounding structures were obtained without intravenous contrast.  COMPARISON:  None.   FINDINGS: There is no acute infarct or intracranial hemorrhage. Multiple scattered, small foci of T2 hyperintensity are present in the subcortical and deep cerebral white matter bilaterally, nonspecific but compatible with mild chronic small vessel ischemic disease. There is mild cerebral atrophy. There is no evidence of mass, midline shift, or extra-axial fluid collection.  Orbits are unremarkable. Mild right maxillary sinus mucosal thickening is present. Mastoid air cells are clear. Major intracranial vascular flow voids are preserved.  IMPRESSION: 1. No evidence of acute intracranial abnormality or mass. 2. Mild chronic small vessel ischemic disease.   Electronically Signed   By: Logan Bores   On: 05/13/2013 13:52   US Biopsy  05/13/2013   INDICATION: Unknown primary, now with right external iliac chain lymph node worrisome for lymphoma. Please perform biopsy for tissue diagnostic purposes  EXAM: ULTRASOUND GUIDED BIOPSY OF ENLARGED RIGHT EXTERNAL ILIAC CHAIN LYMPH NODE  COMPARISON:  CT ENTERO ABD/PELVIS W/CM dated 05/10/2013; CT ANGIO CHEST W/CM &/OR WO/CM dated 04/23/2013  MEDICATIONS: Fentanyl 50 mcg IV; Versed 1 mg IV  ANESTHESIA/SEDATION: Total Moderate Sedation time  15 minutes  COMPLICATIONS: None immediate  TECHNIQUE: Informed written consent was obtained from the patient after a discussion of the risks, benefits and alternatives to treatment. Questions regarding the procedure were encouraged and answered. Initial ultrasound scanning demonstrated an enlarged ill-defined mixed echogenic mass within the cranial aspect of the right groin  compatible with the known heterogeneously enhancing right external iliac chain lymph node/mass. An ultrasound image was saved for documentation purposes. The procedure was planned. A timeout was performed prior to the initiation of the procedure.  The operative was prepped and draped in the usual sterile fashion, and a sterile drape was applied covering the operative field.  A timeout was performed prior to the initiation of the procedure. Local anesthesia was provided with 1% lidocaine with epinephrine.  Under direct ultrasound guidance, 2 separate 18 gauge core needle devices (Achieve and Biopince) were utilized to obtain to obtain a total of 7 core needle biopsies of the ill-defined right external iliac chain lymph node/mass.  The samples were placed in saline and submitted to pathology. The needle was removed and hemostasis was achieved with manual compression. Post procedure scan was negative for significant hematoma. A dressing was placed. The patient tolerated the procedure well without immediate postprocedural complication.  IMPRESSION: Technically successful ultrasound guided core needle biopsy of ill-defined right external iliac chain lymph node/mass.   Electronically Signed   By: Sandi Mariscal M.D.   On: 05/13/2013 11:32    Assessment/Plan: Thrombocytopenia Request for image guided bone marrow biopsy. Patient will be NPO after midnight, labs ordered, procedure for 4/8 at 0800 Risks and Benefits discussed with the patient. All of the patient's questions were answered, patient is agreeable to proceed. Consent signed and in chart.    LOS: 8 days    Rockney Ghee 05/14/2013 2:05 PM

## 2013-05-14 NOTE — Progress Notes (Signed)
[name] [name]   DOB:1955/08/12   [id number]   AUQ#:[id number]  Subjective: patient eating breakfast in recliner; wife in room; discussed continuing Ca++ elevation as possible cause of patient's confusion; discussed possible NHL as explanation for calcium, platelet and LDH abnormalities; discussed need for tissue Bx for definitive Dx   Objective: middle aged White male exmained in recliner Filed Vitals:   05/14/13 0455  BP: 105/75  Pulse: 71  Temp: 98.3 F (36.8 C)  Resp: 18    Body mass index is 30.71 kg/(m^2).  Intake/Output Summary (Last 24 hours) at 05/14/13 0807 Last data filed at 05/14/13 0600  Gross per 24 hour  Intake   2135 ml  Output      0 ml  Net   2135 ml   Exam unchanged from yesterday   CBG (last 3)   Recent Labs  05/13/13 1708 05/13/13 2157 05/14/13 0749  GLUCAP 104* 105* 101*     Labs:  Results for [name] [name] (MRN [id number]) as of 05/12/2013 09:30  Ref. Range 05/06/2013 21:00 05/12/2013 03:32  LDH Latest Range: 94-250 U/L 3192 (H) 3045 (H)    Lab Results  Component Value Date   WBC 5.0 05/14/2013   HGB 12.6* 05/14/2013   HCT 36.7* 05/14/2013   MCV 96.3 05/14/2013   PLT 38* 05/14/2013   NEUTROABS 2.1 05/12/2013    '@LASTCHEMISTRY'$ @  Urine Studies No results found for this basename: UACOL, UAPR, USPG, UPH, UTP, UGL, UKET, UBIL, UHGB, UNIT, UROB, ULEU, UEPI, UWBC, URBC, UBAC, CAST, CRYS, UCOM, BILUA,  in the last 72 hours  Basic Metabolic Panel:  Recent Labs Lab 05/07/13 0810  05/11/13 0505 05/12/13 0849 05/12/13 1912 05/13/13 1124 05/14/13 0630  NA 119*  < > 127* 126* 124* 128* 129*  K 5.3  < > 4.4 5.9* 4.3 4.1 3.8  CL 82*  < > 87* 86* 85* 88* 87*  CO2 27  < > $R'27 26 26 25 26  'qA$ GLUCOSE 99  < > 102* 108* 162* 172* 102*  BUN 12  < > $R'14 20 18 22 23  'ZN$ CREATININE 0.73  < > 0.71 0.78 0.78 0.84 0.82  CALCIUM 10.5  < > 11.5* 11.4* 10.8* 10.6* 9.9  MG 2.2  --   --   --   --   --   --   < > = values in this interval not displayed. GFR Estimated  Creatinine Clearance: 95.6 ml/min (by C-G formula based on Cr of 0.82). Liver Function Tests:  Recent Labs Lab 05/07/13 0810 05/08/13 0341 05/09/13 0334 05/14/13 0630  AST 83* 55* 59* 82*  ALT 46 34 33 30  ALKPHOS 317* 256* 245* 268*  BILITOT 0.7 0.8 0.8 0.8  PROT 6.0 5.6* 5.9* 6.2  ALBUMIN 2.3* 2.2* 2.3* 2.5*   No results found for this basename: LIPASE, AMYLASE,  in the last 168 hours No results found for this basename: AMMONIA,  in the last 168 hours Coagulation profile  Recent Labs Lab 05/11/13 0505 05/11/13 1055  INR 1.31 1.29    CBC:  Recent Labs Lab 05/08/13 0341 05/11/13 0505 05/11/13 1055 05/12/13 0332 05/13/13 0459 05/14/13 0630  WBC 3.4* 4.0  --  4.2 4.0 5.0  NEUTROABS  --   --   --  2.1  --   --   HGB 11.8* 12.2*  --  12.9* 13.4 12.6*  HCT 33.7* 35.9*  --  37.6* 39.2 36.7*  MCV 94.7 97.0  --  96.2 97.3 96.3  PLT 66* 61*  60* 61* 50* 38*   Cardiac Enzymes: No results found for this basename: CKTOTAL, CKMB, CKMBINDEX, TROPONINI,  in the last 168 hours BNP: No components found with this basename: POCBNP,  CBG:  Recent Labs Lab 05/13/13 0748 05/13/13 1127 05/13/13 1708 05/13/13 2157 05/14/13 0749  GLUCAP 127* 163* 104* 105* 101*   D-Dimer  Recent Labs  05/11/13 1055  DDIMER >20.00*   Hgb A1c No results found for this basename: HGBA1C,  in the last 72 hours Lipid Profile No results found for this basename: CHOL, HDL, LDLCALC, TRIG, CHOLHDL, LDLDIRECT,  in the last 72 hours Thyroid function studies No results found for this basename: TSH, T4TOTAL, FREET3, T3FREE, THYROIDAB,  in the last 72 hours Anemia work up No results found for this basename: VITAMINB12, FOLATE, FERRITIN, TIBC, IRON, RETICCTPCT,  in the last 72 hours Microbiology Recent Results (from the past 240 hour(s))  MRSA PCR SCREENING     Status: None   Collection Time    05/06/13  8:08 PM      Result Value Ref Range Status   MRSA by PCR NEGATIVE  NEGATIVE Final    Comment:            The GeneXpert MRSA Assay (FDA     approved for NASAL specimens     only), is one component of a     comprehensive MRSA colonization     surveillance program. It is not     intended to diagnose MRSA     infection nor to guide or     monitor treatment for     MRSA infections.      Studies:  Dg Chest 2 View  05/12/2013   CLINICAL DATA:  Chest pain.  EXAM: CHEST  2 VIEW  COMPARISON:  05/06/2013.  FINDINGS: The cardiac silhouette, mediastinal and hilar contours are within normal limits and stable. The lungs are clear. Slightly low lung volumes with vascular crowding and streaky atelectasis. A small pleural effusion is suspected.  IMPRESSION: Low lung volumes with vascular crowding atelectasis and probable small left pleural effusion.   Electronically Signed   By: Kalman Jewels M.D.   On: 05/12/2013 20:19   Mr Brain Wo Contrast  05/13/2013   CLINICAL DATA:  Intermittent confusion and lethargy.  EXAM: MRI HEAD WITHOUT CONTRAST  TECHNIQUE: Multiplanar, multiecho pulse sequences of the brain and surrounding structures were obtained without intravenous contrast.  COMPARISON:  None.  FINDINGS: There is no acute infarct or intracranial hemorrhage. Multiple scattered, small foci of T2 hyperintensity are present in the subcortical and deep cerebral white matter bilaterally, nonspecific but compatible with mild chronic small vessel ischemic disease. There is mild cerebral atrophy. There is no evidence of mass, midline shift, or extra-axial fluid collection.  Orbits are unremarkable. Mild right maxillary sinus mucosal thickening is present. Mastoid air cells are clear. Major intracranial vascular flow voids are preserved.  IMPRESSION: 1. No evidence of acute intracranial abnormality or mass. 2. Mild chronic small vessel ischemic disease.   Electronically Signed   By: Logan Bores   On: 05/13/2013 13:52   US Biopsy  05/13/2013   INDICATION: Unknown primary, now with right external iliac chain  lymph node worrisome for lymphoma. Please perform biopsy for tissue diagnostic purposes  EXAM: ULTRASOUND GUIDED BIOPSY OF ENLARGED RIGHT EXTERNAL ILIAC CHAIN LYMPH NODE  COMPARISON:  CT ENTERO ABD/PELVIS W/CM dated 05/10/2013; CT ANGIO CHEST W/CM &/OR WO/CM dated 04/23/2013  MEDICATIONS: Fentanyl 50 mcg IV; Versed 1 mg IV  ANESTHESIA/SEDATION: Total Moderate Sedation time  15 minutes  COMPLICATIONS: None immediate  TECHNIQUE: Informed written consent was obtained from the patient after a discussion of the risks, benefits and alternatives to treatment. Questions regarding the procedure were encouraged and answered. Initial ultrasound scanning demonstrated an enlarged ill-defined mixed echogenic mass within the cranial aspect of the right groin compatible with the known heterogeneously enhancing right external iliac chain lymph node/mass. An ultrasound image was saved for documentation purposes. The procedure was planned. A timeout was performed prior to the initiation of the procedure.  The operative was prepped and draped in the usual sterile fashion, and a sterile drape was applied covering the operative field. A timeout was performed prior to the initiation of the procedure. Local anesthesia was provided with 1% lidocaine with epinephrine.  Under direct ultrasound guidance, 2 separate 18 gauge core needle devices (Achieve and Biopince) were utilized to obtain to obtain a total of 7 core needle biopsies of the ill-defined right external iliac chain lymph node/mass.  The samples were placed in saline and submitted to pathology. The needle was removed and hemostasis was achieved with manual compression. Post procedure scan was negative for significant hematoma. A dressing was placed. The patient tolerated the procedure well without immediate postprocedural complication.  IMPRESSION: Technically successful ultrasound guided core needle biopsy of ill-defined right external iliac chain lymph node/mass.   Electronically  Signed   By: Sandi Mariscal M.D.   On: 05/13/2013 11:32    Assessment: 58 y.o. [place] man admitted with multiple electrolyte abnormalities possibly related to "diet pills"/ polydipsia/ spironolactone,  (1) unexplained thrombocytopenia first noted 05/06/2013 (normal 05/01/2013), with normal creatinine, PT/INR and PTT and no schistocytes on smear (2) Also with poorly characterized liver lesions which may be regenerating nodules in the presence of cirrhosis in this patient with documented history of ETOH abuse.  (3) R iliac adenopathy with a dominant nodal mass measuring 4.9 cm felt worrisome for lymphoma--Bx 4/6 with results pending (4) persistent hypercalcemia, greatly elevated LDH, worsening thrombocytopenia all may be due to NHL, which is working Dx  Plan: platelets continue to decline. Biopsy results from yesterday pending, as are 5-HIAA and HIT panel. Cannot find that PTH was sent.  Will go ahead and obtain a bone marrow biopsy to further evaluate the thrombocytopenia and to stage his NHL if that turns out to be the diagnosis. Discussed with patient, who agrees.  If we have Bx results today or early tomorrow will arrange for meeting with pt and wife to go over results, prognosis and treatment plan.  Will follow with you   Chauncey Cruel, MD 05/14/2013  8:07 AM

## 2013-05-14 NOTE — Progress Notes (Signed)
[facility] PROGRESS NOTE  [name] [name] IEP:[id number] DOB: March 10, 1955 DOA: 05/06/2013 PCP: [name], MD Interim Summary:  [name] [name] is a 58 y.o. male  With history of hypertension, peptic ulcer disease, gastroesophageal reflux disease, diet-controlled type 2 diabetes, hyperlipidemia who had had an extensive chest pain workup during his last hospitalization from 04/23/2013 to 04/25/2013 which was negative for cardiac etiology and negative for PE. Patient had presented for upper endoscopy for GI evaluation of his chest pain when labs reviewed showed a continued decline in his sodium to 116 with a potassium of 5.4 and a calcium of 11.1, platelets of 80,000 and elevated liver function tests. Patient was subsequently sent from the endoscopy suite to the emergency room to be admitted for further evaluation and management.  ON admission he was hypovolemic on admission, started on fluids and his sodium improved to 121. He became euvolemic but his sodium remained low. He was started on tolvaptan on 4/1 and as his sodium improved to 131 ,it was discontinued on 4/3 as per protocol. TSH and cortisol level within normal limits. His lisinopril and spironolactone were discontinued in view of his hyperkalemia.  He also reported that he has been drinking alcohol ( 8 beers) every day.  Meanwhile his thrombocytopenia worsened and hematology consulted for further recommendations.  He was worked up for elevated LFTS ,with an ultrasound of the abdomen, revealed some hypoechoic lesions in the liver. It was followed up with  MRI abdomen and CT abdomen , showing multiple liver lesions and right iliac lymphadenopathy. In view of his hypercalcemia, elevated LDH, thrombocytopenia, he underwent a US guided biopsy of the external iliac lymph node/ mass on 05/13/13. Over the last 24 hours his confusion has worsened and he was also slightly agitated.  Obtained MRI of the brain, negative for acute stroke.   Assessment/Plan: Hyponatremia -Hypovolemic on admission, seems to be euvolemic at present. As his sodium did not improve with NS fluids,  we have started him on tolvaptan on 4/1 and discontinued on 4/3 as his sodium improved to 131. But his sodium dropped to 124 in the last 3 days off tolvaptan.  He is getting more confused over the last 24 hours which could be from the low sodium. He was started on demeclocycline, with minimal  improvement. An MRI of the brain is also ordered it was negative for acute stroke. -TSH/cortisol ok.   Hyperkalemia -Off lisinopril/spironolactone. -Corrected with IVF.  Hypercalcemia -Suspect related to dehydration, but with his thrombocytopenia, external iliac lymph node enlargement, elevated LDH, we will rule out a lymphoma with a biopsy. He was started on NS fluids andhis hypercalcemia temporarily improved, but is back high even after a dose of pamidronate. We will restart him on NS fluids and his po intake is minimal.  -recheck CMET in am.  Dysphagia Underwent EGD ON 4/4 showed minimal gastritis.   Orthostatic Hypotension -Resolved.  Transaminitis, mild -AST/Alk phos only. -?closet drinker. This could also explain his severe hyponatremia.  -Acute hepatitis panel negative. -Abdominal US negative.    Abnormal MRI of the abdomen/ CT ABD: revealing multiple liver lesions and abnormal area of mesentery. CT abd and pelvis ordered for further evaluation.  CT showed external iliac  lymphadenopathy, and IR consulted for biopsy. He underwent biopsy of the  right external iliac lymphadenopathy.  Thrombocytopenia:  - worsening. No obvious signs of bleeding.  - unclear etiology and hematology consulted for further input.  - appreciate hematology recommendations.  -unlikely DIC or HUS, with normal  creatinine.  HIT panel pending.  - will need BM biopsy for further evaluation of his thrombocytopenia.   DM II -Well controlled. - CBG (last 3)   Recent Labs   05/14/13 0749 05/14/13 1149 05/14/13 1643  GLUCAP 101* 100* 145*    Intermittent confusion/ Lethargy: - possibly from electrolyte abnormalities . MRI brain ordered.and negative.   - continue to monitor.   Fever:  - blood cultures will be ordered. Normal WBC count. CXR and UA negative for infection. He denies any abdominal pain or headache.   Constipation: -s tool softeners added.   DVT prophylaxis. SCD'S Code Status: Full Code Family Communication: Patient only discussed with wife over the phone.  Disposition Plan: Home when ready   Consultants:  GI  Ir   Hematology/oncology    Antibiotics:  None   Subjective: No complaints, denies any sob, cp, or abdominal pain.    Objective: Filed Vitals:   05/13/13 2105 05/14/13 0455 05/14/13 1026 05/14/13 1429  BP: 111/67 105/75 107/60 106/60  Pulse: 79 71 79 78  Temp: 97.8 F (36.6 C) 98.3 F (36.8 C)  98.4 F (36.9 C)  TempSrc: Oral Oral  Oral  Resp: $Remo'20 18 18 16  'wpbrr$ Height:      Weight:      SpO2: 98% 97% 99% 98%    Intake/Output Summary (Last 24 hours) at 05/14/13 1800 Last data filed at 05/14/13 1719  Gross per 24 hour  Intake 1922.5 ml  Output      0 ml  Net 1922.5 ml   Filed Weights   05/11/13 0447 05/12/13 0432 05/13/13 0404  Weight: 81.693 kg (180 lb 1.6 oz) 81.4 kg (179 lb 7.3 oz) 81.2 kg (179 lb 0.2 oz)    Exam:   General:  AA Ox3  Cardiovascular: RRR  Respiratory: CTA B  Abdomen: S/NT/ND/+BS  Extremities: no C/C/E   Neurologic:  Non-focal.  Data Reviewed: Basic Metabolic Panel:  Recent Labs Lab 05/11/13 0505 05/12/13 0849 05/12/13 1912 05/13/13 1124 05/14/13 0630  NA 127* 126* 124* 128* 129*  K 4.4 5.9* 4.3 4.1 3.8  CL 87* 86* 85* 88* 87*  CO2 $Re'27 26 26 25 26  'Cuu$ GLUCOSE 102* 108* 162* 172* 102*  BUN $Re'14 20 18 22 23  'UlB$ CREATININE 0.71 0.78 0.78 0.84 0.82  CALCIUM 11.5* 11.4* 10.8* 10.6* 9.9   Liver Function Tests:  Recent Labs Lab 05/08/13 0341 05/09/13 0334 05/14/13 0630   AST 55* 59* 82*  ALT 34 33 30  ALKPHOS 256* 245* 268*  BILITOT 0.8 0.8 0.8  PROT 5.6* 5.9* 6.2  ALBUMIN 2.2* 2.3* 2.5*   No results found for this basename: LIPASE, AMYLASE,  in the last 168 hours No results found for this basename: AMMONIA,  in the last 168 hours CBC:  Recent Labs Lab 05/08/13 0341 05/11/13 0505 05/11/13 1055 05/12/13 0332 05/13/13 0459 05/14/13 0630  WBC 3.4* 4.0  --  4.2 4.0 5.0  NEUTROABS  --   --   --  2.1  --   --   HGB 11.8* 12.2*  --  12.9* 13.4 12.6*  HCT 33.7* 35.9*  --  37.6* 39.2 36.7*  MCV 94.7 97.0  --  96.2 97.3 96.3  PLT 66* 61* 60* 61* 50* 38*   Cardiac Enzymes: No results found for this basename: CKTOTAL, CKMB, CKMBINDEX, TROPONINI,  in the last 168 hours BNP (last 3 results) No results found for this basename: PROBNP,  in the last 8760 hours CBG:  Recent Labs  Lab 05/13/13 1708 05/13/13 2157 05/14/13 0749 05/14/13 1149 05/14/13 1643  GLUCAP 104* 105* 101* 100* 145*    Recent Results (from the past 240 hour(s))  MRSA PCR SCREENING     Status: None   Collection Time    05/06/13  8:08 PM      Result Value Ref Range Status   MRSA by PCR NEGATIVE  NEGATIVE Final   Comment:            The GeneXpert MRSA Assay (FDA     approved for NASAL specimens     only), is one component of a     comprehensive MRSA colonization     surveillance program. It is not     intended to diagnose MRSA     infection nor to guide or     monitor treatment for     MRSA infections.  CULTURE, BLOOD (ROUTINE X 2)     Status: None   Collection Time    05/12/13  8:40 PM      Result Value Ref Range Status   Specimen Description BLOOD RIGHT ARM   Final   Special Requests BOTTLES DRAWN AEROBIC ONLY 2CC   Final   Culture  Setup Time     Final   Value: 05/13/2013 02:22     Performed at [facility]   Culture     Final   Value:        BLOOD CULTURE RECEIVED NO GROWTH TO DATE CULTURE WILL BE HELD FOR 5 DAYS BEFORE ISSUING A FINAL NEGATIVE REPORT      Performed at [facility]   Report Status PENDING   Incomplete  CULTURE, BLOOD (ROUTINE X 2)     Status: None   Collection Time    05/12/13  8:56 PM      Result Value Ref Range Status   Specimen Description BLOOD RIGHT HAND   Final   Special Requests BOTTLES DRAWN AEROBIC ONLY 2CC   Final   Culture  Setup Time     Final   Value: 05/13/2013 02:22     Performed at [facility]   Culture     Final   Value:        BLOOD CULTURE RECEIVED NO GROWTH TO DATE CULTURE WILL BE HELD FOR 5 DAYS BEFORE ISSUING A FINAL NEGATIVE REPORT     Performed at [facility]   Report Status PENDING   Incomplete     Studies: Dg Chest 2 View  05/12/2013   CLINICAL DATA:  Chest pain.  EXAM: CHEST  2 VIEW  COMPARISON:  05/06/2013.  FINDINGS: The cardiac silhouette, mediastinal and hilar contours are within normal limits and stable. The lungs are clear. Slightly low lung volumes with vascular crowding and streaky atelectasis. A small pleural effusion is suspected.  IMPRESSION: Low lung volumes with vascular crowding atelectasis and probable small left pleural effusion.   Electronically Signed   By: Kalman Jewels M.D.   On: 05/12/2013 20:19   Mr Brain Wo Contrast  05/13/2013   CLINICAL DATA:  Intermittent confusion and lethargy.  EXAM: MRI HEAD WITHOUT CONTRAST  TECHNIQUE: Multiplanar, multiecho pulse sequences of the brain and surrounding structures were obtained without intravenous contrast.  COMPARISON:  None.  FINDINGS: There is no acute infarct or intracranial hemorrhage. Multiple scattered, small foci of T2 hyperintensity are present in the subcortical and deep cerebral white matter bilaterally, nonspecific but compatible with mild chronic small vessel ischemic disease. There is  mild cerebral atrophy. There is no evidence of mass, midline shift, or extra-axial fluid collection.  Orbits are unremarkable. Mild right maxillary sinus mucosal thickening is present. Mastoid air cells are clear. Major  intracranial vascular flow voids are preserved.  IMPRESSION: 1. No evidence of acute intracranial abnormality or mass. 2. Mild chronic small vessel ischemic disease.   Electronically Signed   By: Logan Bores   On: 05/13/2013 13:52   US Biopsy  05/13/2013   INDICATION: Unknown primary, now with right external iliac chain lymph node worrisome for lymphoma. Please perform biopsy for tissue diagnostic purposes  EXAM: ULTRASOUND GUIDED BIOPSY OF ENLARGED RIGHT EXTERNAL ILIAC CHAIN LYMPH NODE  COMPARISON:  CT ENTERO ABD/PELVIS W/CM dated 05/10/2013; CT ANGIO CHEST W/CM &/OR WO/CM dated 04/23/2013  MEDICATIONS: Fentanyl 50 mcg IV; Versed 1 mg IV  ANESTHESIA/SEDATION: Total Moderate Sedation time  15 minutes  COMPLICATIONS: None immediate  TECHNIQUE: Informed written consent was obtained from the patient after a discussion of the risks, benefits and alternatives to treatment. Questions regarding the procedure were encouraged and answered. Initial ultrasound scanning demonstrated an enlarged ill-defined mixed echogenic mass within the cranial aspect of the right groin compatible with the known heterogeneously enhancing right external iliac chain lymph node/mass. An ultrasound image was saved for documentation purposes. The procedure was planned. A timeout was performed prior to the initiation of the procedure.  The operative was prepped and draped in the usual sterile fashion, and a sterile drape was applied covering the operative field. A timeout was performed prior to the initiation of the procedure. Local anesthesia was provided with 1% lidocaine with epinephrine.  Under direct ultrasound guidance, 2 separate 18 gauge core needle devices (Achieve and Biopince) were utilized to obtain to obtain a total of 7 core needle biopsies of the ill-defined right external iliac chain lymph node/mass.  The samples were placed in saline and submitted to pathology. The needle was removed and hemostasis was achieved with manual  compression. Post procedure scan was negative for significant hematoma. A dressing was placed. The patient tolerated the procedure well without immediate postprocedural complication.  IMPRESSION: Technically successful ultrasound guided core needle biopsy of ill-defined right external iliac chain lymph node/mass.   Electronically Signed   By: Sandi Mariscal M.D.   On: 05/13/2013 11:32    Scheduled Meds: . demeclocycline  150 mg Oral 4 times per day  . feeding supplement (ENSURE)  1 Container Oral BID BM  . insulin aspart  0-15 Units Subcutaneous TID WC  . metoprolol succinate  100 mg Oral Daily  . pantoprazole  80 mg Oral Q1200  . polyethylene glycol  17 g Oral Daily  . senna-docusate  2 tablet Oral BID  . sodium chloride  3 mL Intravenous Q12H   Continuous Infusions: . sodium chloride 75 mL/hr at 05/14/13 9794    Principal Problem:   Hyponatremia Active Problems:   Type II or unspecified type diabetes mellitus with neurological manifestations, not stated as uncontrolled(250.60)   HYPERLIPIDEMIA   GOUT   HYPERTENSION   GERD   PEPTIC ULCER DISEASE   Diabetic neuropathy   Elevated LFTs   Other dysphagia   Chest pain   Hyperkalemia   Thrombocytopenia   Hypercalcemia   Orthostasis    Time spent: 35 minutes.     [facility] Pager [phone]  If 7PM-7AM, please contact night-coverage at [email or web address], password [facility] 05/14/2013, 6:00 PM  LOS: 8 days

## 2013-05-15 ENCOUNTER — Inpatient Hospital Stay (HOSPITAL_COMMUNITY): Payer: No Typology Code available for payment source

## 2013-05-15 ENCOUNTER — Encounter (HOSPITAL_COMMUNITY): Payer: Self-pay | Admitting: Radiology

## 2013-05-15 DIAGNOSIS — C8589 Other specified types of non-Hodgkin lymphoma, extranodal and solid organ sites: Principal | ICD-10-CM

## 2013-05-15 LAB — CBC
HCT: 29.7 % — ABNORMAL LOW (ref 39.0–52.0)
Hemoglobin: 10.1 g/dL — ABNORMAL LOW (ref 13.0–17.0)
MCH: 33 pg (ref 26.0–34.0)
MCHC: 34 g/dL (ref 30.0–36.0)
MCV: 97.1 fL (ref 78.0–100.0)
PLATELETS: 28 10*3/uL — AB (ref 150–400)
RBC: 3.06 MIL/uL — ABNORMAL LOW (ref 4.22–5.81)
RDW: 13 % (ref 11.5–15.5)
WBC: 3.1 10*3/uL — AB (ref 4.0–10.5)

## 2013-05-15 LAB — GLUCOSE, CAPILLARY
GLUCOSE-CAPILLARY: 107 mg/dL — AB (ref 70–99)
Glucose-Capillary: 67 mg/dL — ABNORMAL LOW (ref 70–99)
Glucose-Capillary: 96 mg/dL (ref 70–99)
Glucose-Capillary: 129 mg/dL — ABNORMAL HIGH (ref 70–99)
Glucose-Capillary: 127 mg/dL — ABNORMAL HIGH (ref 70–99)

## 2013-05-15 LAB — BASIC METABOLIC PANEL
BUN: 14 mg/dL (ref 6–23)
CHLORIDE: 97 meq/L (ref 96–112)
CO2: 23 meq/L (ref 19–32)
Calcium: 8.7 mg/dL (ref 8.4–10.5)
Creatinine, Ser: 0.68 mg/dL (ref 0.50–1.35)
GFR calc Af Amer: >90 mL/min (ref >90)
GFR calc non Af Amer: >90 mL/min (ref >90)
Glucose, Bld: 113 mg/dL — ABNORMAL HIGH (ref 70–99)
POTASSIUM: 3.4 meq/L — AB (ref 3.7–5.3)
Sodium: 133 mEq/L — ABNORMAL LOW (ref 137–147)

## 2013-05-15 LAB — 5 HIAA, QUANTITATIVE, URINE, 24 HOUR
5-HIAA, 24 Hr Urine: 5.9 mg/24 h (ref <=6.0)
Volume, Urine-5HIAA: 1500 mL/24 h

## 2013-05-15 LAB — AMMONIA: Ammonia: 35 umol/L (ref 11–60)

## 2013-05-15 LAB — PROTIME-INR
INR: 1.26 (ref 0.00–1.49)
Prothrombin Time: 15.5 seconds — ABNORMAL HIGH (ref 11.6–15.2)

## 2013-05-15 LAB — BONE MARROW EXAM

## 2013-05-15 LAB — PTH, INTACT AND CALCIUM
Calcium, Total (PTH): 8 mg/dL — ABNORMAL LOW (ref 8.4–10.5)
PTH: 9.6 pg/mL — ABNORMAL LOW (ref 14.0–72.0)

## 2013-05-15 MED ORDER — MIDAZOLAM HCL 2 MG/2ML IJ SOLN
INTRAMUSCULAR | Status: AC
  Filled 2013-05-15: qty 6

## 2013-05-15 MED ORDER — FENTANYL CITRATE 0.05 MG/ML IJ SOLN
INTRAMUSCULAR | Status: AC
  Filled 2013-05-15: qty 6

## 2013-05-15 MED ORDER — DEXTROSE 50 % IV SOLN
1.0000 | Freq: Once | INTRAVENOUS | Status: AC
  Administered 2013-05-15: 50 mL via INTRAVENOUS

## 2013-05-15 MED ORDER — FENTANYL CITRATE 0.05 MG/ML IJ SOLN
INTRAMUSCULAR | Status: AC | PRN
  Administered 2013-05-15 (×2): 25 ug via INTRAVENOUS

## 2013-05-15 MED ORDER — MIDAZOLAM HCL 2 MG/2ML IJ SOLN
INTRAMUSCULAR | Status: AC | PRN
  Administered 2013-05-15: 0.5 mg via INTRAVENOUS
  Administered 2013-05-15: 1 mg via INTRAVENOUS

## 2013-05-15 MED ORDER — DEXTROSE 50 % IV SOLN
INTRAVENOUS | Status: AC
  Filled 2013-05-15: qty 50

## 2013-05-15 MED ORDER — HYDROCODONE-ACETAMINOPHEN 5-325 MG PO TABS
1.0000 | ORAL_TABLET | ORAL | Status: DC | PRN
  Administered 2013-05-16: 2 via ORAL
  Filled 2013-05-15: qty 2

## 2013-05-15 MED ORDER — IOHEXOL 300 MG/ML  SOLN
80.0000 mL | Freq: Once | INTRAMUSCULAR | Status: AC | PRN
  Administered 2013-05-15: 80 mL via INTRAVENOUS

## 2013-05-15 NOTE — Progress Notes (Signed)
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 The bone marrow Bx this AM is pending. We need to stage the pt with a PET before the start of treatment and the hospital has been accomodating enough to allow this--they will not be reimbursed--however there is no availability currently and we need to get stared on therapy. I have made the pt npo after MN just in case a cancellation allows [place] to do the study  I spent >30 min discussing the diagnosis with [name] [name] and his mother today and have scheduled another meeting with him and his wife tomorrow AM. He will need to start treatment this admission. Please transfer the patient to my service and I will take it from there. I will write for transfer to [place] where he will be treated.  Greatly appreciate your help to this patient!

## 2013-05-15 NOTE — Procedures (Signed)
CT-guided  R iliac bone marrow aspiration and core biopsy No complication No blood loss. See complete dictation in [facility] PACS  

## 2013-05-15 NOTE — Progress Notes (Signed)
Martina Sinner, RN received critical plt level of 28. MD notified, no new orders given. Will pass this on to day shift RN. Will continue to monitor pt. [place]

## 2013-05-15 NOTE — Progress Notes (Signed)
Hypoglycemic Event  CBG: 67  Treatment: D50 IV 25 mL  Symptoms: None  Follow-up CBG: Time:0805 CBG Result:92 rechecked in CT by [facility].   Possible Reasons for Event: Inadequate meal intake  Comments/MD notified: Patient currently NPO for procedure today.    [name]  Remember to initiate Hypoglycemia Order Set & complete

## 2013-05-15 NOTE — Progress Notes (Signed)
[facility] PROGRESS NOTE  [name] [name] VCB:[id number] DOB: 1955-07-16 DOA: 05/06/2013 PCP: [name], MD   Assessment/Plan: Hyponatremia -Hypovolemic on admission, seems to be euvolemic at present. As his sodium did not improve with NS fluids,  we have started him on tolvaptan on 4/1 and discontinued on 4/3 as his sodium improved to 131. But his sodium dropped to 124 in the last 3 days off tolvaptan.  He was started on demeclocycline, with minimal  improvement. An MRI of the brain is also ordered it was negative for acute stroke. -TSH/cortisol ok. - Sodium 133 this morning Hyperkalemia -Off lisinopril/spironolactone. -Corrected with IVF. Hypercalcemia -Suspect related to dehydration, but with his thrombocytopenia, external iliac lymph node enlargement, elevated LDH, we will rule out a lymphoma with a biopsy. He was started on NS fluids andhis hypercalcemia temporarily improved, but is back high even after a dose of pamidronate. We will restart him on NS fluids and his po intake is minimal.  -recheck CMET in am. Dysphagia Underwent EGD ON 4/4 showed minimal gastritis.  Orthostatic Hypotension -Resolved. Transaminitis, mild -AST/Alk phos only. -?closet drinker. This could also explain his severe hyponatremia.  -Acute hepatitis panel negative. -Abdominal US negative.  Abnormal MRI of the abdomen/ CT ABD: revealing multiple liver lesions and abnormal area of mesentery. CT abd and pelvis ordered for further evaluation.  CT showed external iliac  lymphadenopathy, and IR consulted for biopsy. He underwent biopsy of the  right external iliac lymphadenopathy. Thrombocytopenia:  - worsening. No obvious signs of bleeding.  - unclear etiology and hematology consulted for further input.  - appreciate hematology recommendations.  - unlikely DIC or HUS, with normal creatinine.  HIT panel pending.  - Bone marrow biopsy done today, appreciate oncology input. DM II -Well  controlled. Intermittent confusion/ Lethargy: - possibly from electrolyte abnormalities . MRI brain ordered.and negative.   - continue to monitor.  Fever:  - blood cultures will be ordered. Normal WBC count. CXR and UA negative for infection. He denies any abdominal pain or headache.  Constipation: -s tool softeners added.   DVT prophylaxis. SCD'S Code Status: Full Code Family Communication: Patient only Disposition Plan: Home when ready   Consultants:  GI  Ir   Hematology/oncology    Antibiotics:  None   Subjective: No complaints, denies any sob, cp, or abdominal pain.    Objective: Filed Vitals:   05/15/13 0935 05/15/13 0950 05/15/13 1005 05/15/13 1314  BP: 102/55 101/54 107/57 99/58  Pulse: 63 71 71 78  Temp: 98.6 F (37 C) 98.3 F (36.8 C) 98.3 F (36.8 C) 98 F (36.7 C)  TempSrc: Oral Oral Oral Oral  Resp: $Remo'16 16 18 18  'Idqhe$ Height:      Weight:      SpO2: 99% 98% 97% 98%    Intake/Output Summary (Last 24 hours) at 05/15/13 1849 Last data filed at 05/15/13 1700  Gross per 24 hour  Intake   1725 ml  Output      0 ml  Net   1725 ml   Filed Weights   05/12/13 0432 05/13/13 0404 05/15/13 0449  Weight: 81.4 kg (179 lb 7.3 oz) 81.2 kg (179 lb 0.2 oz) 82.2 kg (181 lb 3.5 oz)    Exam:   General:  AA Ox3  Cardiovascular: RRR  Respiratory: CTA B  Abdomen: S/NT/ND/+BS  Extremities: no C/C/E   Neurologic:  Non-focal.  Data Reviewed: Basic Metabolic Panel:  Recent Labs Lab 05/12/13 0849 05/12/13 1912 05/13/13 1124 05/14/13 0630 05/14/13  2703 05/15/13 1125  NA 126* 124* 128* 129*  --  133*  K 5.9* 4.3 4.1 3.8  --  3.4*  CL 86* 85* 88* 87*  --  97  CO2 $Re'26 26 25 26  'mrN$ --  23  GLUCOSE 108* 162* 172* 102*  --  113*  BUN $Re'20 18 22 23  'eEK$ --  14  CREATININE 0.78 0.78 0.84 0.82  --  0.68  CALCIUM 11.4* 10.8* 10.6* 9.9 8.0* 8.7   Liver Function Tests:  Recent Labs Lab 05/09/13 0334 05/14/13 0630  AST 59* 82*  ALT 33 30  ALKPHOS 245* 268*   BILITOT 0.8 0.8  PROT 5.9* 6.2  ALBUMIN 2.3* 2.5*   No results found for this basename: LIPASE, AMYLASE,  in the last 168 hours  Recent Labs Lab 05/15/13 1125  AMMONIA 35   CBC:  Recent Labs Lab 05/11/13 0505 05/11/13 1055 05/12/13 0332 05/13/13 0459 05/14/13 0630 05/15/13 0325  WBC 4.0  --  4.2 4.0 5.0 3.1*  NEUTROABS  --   --  2.1  --   --   --   HGB 12.2*  --  12.9* 13.4 12.6* 10.1*  HCT 35.9*  --  37.6* 39.2 36.7* 29.7*  MCV 97.0  --  96.2 97.3 96.3 97.1  PLT 61* 60* 61* 50* 38* 28*   Cardiac Enzymes: No results found for this basename: CKTOTAL, CKMB, CKMBINDEX, TROPONINI,  in the last 168 hours BNP (last 3 results) No results found for this basename: PROBNP,  in the last 8760 hours CBG:  Recent Labs Lab 05/14/13 2128 05/15/13 0741 05/15/13 0823 05/15/13 1159 05/15/13 1708  GLUCAP 99 67* 96 129* 107*    Recent Results (from the past 240 hour(s))  MRSA PCR SCREENING     Status: None   Collection Time    05/06/13  8:08 PM      Result Value Ref Range Status   MRSA by PCR NEGATIVE  NEGATIVE Final   Comment:            The GeneXpert MRSA Assay (FDA     approved for NASAL specimens     only), is one component of a     comprehensive MRSA colonization     surveillance program. It is not     intended to diagnose MRSA     infection nor to guide or     monitor treatment for     MRSA infections.  CULTURE, BLOOD (ROUTINE X 2)     Status: None   Collection Time    05/12/13  8:40 PM      Result Value Ref Range Status   Specimen Description BLOOD RIGHT ARM   Final   Special Requests BOTTLES DRAWN AEROBIC ONLY 2CC   Final   Culture  Setup Time     Final   Value: 05/13/2013 02:22     Performed at [facility]   Culture     Final   Value:        BLOOD CULTURE RECEIVED NO GROWTH TO DATE CULTURE WILL BE HELD FOR 5 DAYS BEFORE ISSUING A FINAL NEGATIVE REPORT     Performed at [facility]   Report Status PENDING   Incomplete  CULTURE, BLOOD  (ROUTINE X 2)     Status: None   Collection Time    05/12/13  8:56 PM      Result Value Ref Range Status   Specimen Description BLOOD RIGHT HAND   Final  Special Requests BOTTLES DRAWN AEROBIC ONLY 2CC   Final   Culture  Setup Time     Final   Value: 05/13/2013 02:22     Performed at [facility]   Culture     Final   Value:        BLOOD CULTURE RECEIVED NO GROWTH TO DATE CULTURE WILL BE HELD FOR 5 DAYS BEFORE ISSUING A FINAL NEGATIVE REPORT     Performed at [facility]   Report Status PENDING   Incomplete     Studies: Ct Chest W Contrast  05/15/2013   CLINICAL DATA:  Staging of non-Hodgkin's lymphoma. No chest complaints. History of gastroesophageal reflux disease. Hypertension. Diabetes.  EXAM: CT CHEST WITH CONTRAST  TECHNIQUE: Multidetector CT imaging of the chest was performed during intravenous contrast administration.  CONTRAST:  72mL OMNIPAQUE IOHEXOL 300 MG/ML  SOLN  COMPARISON:  CT ANGIO CHEST W/CM &/OR WO/CM dated 04/23/2013; CT ENTERO ABD/PELVIS W/CM dated 05/10/2013; MR ABDOMEN WO/W CM dated 05/10/2013  FINDINGS: Lungs/Pleura: No pulmonary nodule or mass. Trace left pleural fluid which is new since 04/23/2013.  Heart/Mediastinum: No supraclavicular adenopathy. Bilateral gynecomastia, slightly greater on the left than right. Aortic and branch vessel atherosclerosis. Heart size upper normal, without pericardial effusion. Multivessel coronary artery atherosclerosis. No central pulmonary embolism, on this non-dedicated study. No mediastinal or hilar adenopathy.  Upper Abdomen: Mild cirrhosis. Liver lesions detailed on prior MRI are not readily apparent. Trace perihepatic fluid, new since 05/10/2013. Normal adrenal glands. Mildly prominent periportal nodes which are favored to be reactive and related to cirrhosis. Subtle low-density splenic lesions, better evaluated on prior MRI.  Bones/Musculoskeletal: Posttraumatic or developmental defect about the third and fourth anterior  left ribs. No worrisome osseous lesion.  IMPRESSION: 1. No acute process or evidence of active lymphoma within the chest. 2. New trace left pleural fluid. 3. Bilateral gynecomastia. 4. Age advanced coronary artery atherosclerosis. Recommend assessment of coronary risk factors and consideration of medical therapy. 5. Mild cirrhosis with new perihepatic trace fluid since 05/10/2013.   Electronically Signed   By: Abigail Miyamoto M.D.   On: 05/15/2013 15:23   Ct Biopsy  05/15/2013   CLINICAL DATA:  Thrombocytopenia  EXAM: CT GUIDED DEEP ILIAC BONE ASPIRATION AND CORE BIOPSY  TECHNIQUE: The procedure, risks (including but not limited to bleeding, infection, organ damage ), benefits, and alternatives were explained to the patient. Questions regarding the procedure were encouraged and answered. The patient understands and consents to the procedure. Patient was placed supine on the CT gantry and limited axial scans through the pelvis were obtained. Appropriate skin entry site was identified. Skin site was marked, prepped with Betadine, draped in usual sterile fashion, and infiltrated locally with 1% lidocaine.  Intravenous Fentanyl and Versed were administered as conscious sedation during continuous cardiorespiratory monitoring by the radiology RN, with a total moderate sedation time of less than 30 minutes.  Under CT fluoroscopic guidance an 11-gauge Cook trocar bone needle was advanced into the right iliac bone just lateral to the sacroiliac joint. Once needle tip position was confirmed, coaxial aspiration samples were attempted but not obtained. The needles reposition the second time but again no aspirate could be obtained. Two separate long core specimens were obtained using the needle itself, which was then removed. Post procedure scans show no hematoma or fracture. Patient tolerated procedure well, with no immediate complication.  IMPRESSION: 1. Technically successful CT guided right iliac bone core and aspiration  biopsy.   Electronically Signed  By: Arne Cleveland M.D.   On: 05/15/2013 14:19    Scheduled Meds: . demeclocycline  150 mg Oral 4 times per day  . feeding supplement (ENSURE)  1 Container Oral BID BM  . insulin aspart  0-15 Units Subcutaneous TID WC  . metoprolol succinate  100 mg Oral Daily  . pantoprazole  80 mg Oral Q1200  . polyethylene glycol  17 g Oral Daily  . senna-docusate  2 tablet Oral BID  . sodium chloride  3 mL Intravenous Q12H   Continuous Infusions: . sodium chloride 75 mL/hr (05/15/13 1843)    Principal Problem:   Hyponatremia Active Problems:   Type II or unspecified type diabetes mellitus with neurological manifestations, not stated as uncontrolled(250.60)   HYPERLIPIDEMIA   GOUT   HYPERTENSION   GERD   PEPTIC ULCER DISEASE   Diabetic neuropathy   Elevated LFTs   Other dysphagia   Chest pain   Hyperkalemia   Thrombocytopenia   Hypercalcemia   Orthostasis  Time spent: 35 minutes.   [facility] Pager [phone]  If 7PM-7AM, please contact night-coverage at [email or web address], password [facility] 05/15/2013, 6:49 PM  LOS: 9 days

## 2013-05-15 NOTE — Progress Notes (Signed)
CRITICAL VALUE ALERT  Critical value received:  Platelets 28  Date of notification:  05/15/13  Time of notification:  0509  Critical value read back:yes  Nurse who received alert:  Dellie Catholic  MD notified (1st page):yes  Time of first page: 0510

## 2013-05-16 ENCOUNTER — Encounter (HOSPITAL_COMMUNITY): Payer: No Typology Code available for payment source

## 2013-05-16 ENCOUNTER — Encounter (HOSPITAL_COMMUNITY): Payer: Self-pay

## 2013-05-16 ENCOUNTER — Encounter: Payer: Self-pay | Admitting: Oncology

## 2013-05-16 ENCOUNTER — Other Ambulatory Visit (HOSPITAL_COMMUNITY): Payer: No Typology Code available for payment source

## 2013-05-16 ENCOUNTER — Other Ambulatory Visit: Payer: Self-pay | Admitting: Oncology

## 2013-05-16 DIAGNOSIS — Z9189 Other specified personal risk factors, not elsewhere classified: Secondary | ICD-10-CM

## 2013-05-16 DIAGNOSIS — D61818 Other pancytopenia: Secondary | ICD-10-CM

## 2013-05-16 DIAGNOSIS — C8590 Non-Hodgkin lymphoma, unspecified, unspecified site: Secondary | ICD-10-CM

## 2013-05-16 LAB — URIC ACID: URIC ACID, SERUM: 4 mg/dL (ref 4.0–7.8)

## 2013-05-16 LAB — CBC WITH DIFFERENTIAL/PLATELET
Basophils Absolute: 0 10*3/uL (ref 0.0–0.1)
Basophils Relative: 1 % (ref 0–1)
EOS ABS: 0 10*3/uL (ref 0.0–0.7)
Eosinophils Relative: 0 % (ref 0–5)
HEMATOCRIT: 31.5 % — AB (ref 39.0–52.0)
HEMOGLOBIN: 10.4 g/dL — AB (ref 13.0–17.0)
LYMPHS PCT: 41 % (ref 12–46)
Lymphs Abs: 1.8 10*3/uL (ref 0.7–4.0)
MCH: 32.7 pg (ref 26.0–34.0)
MCHC: 33 g/dL (ref 30.0–36.0)
MCV: 99.1 fL (ref 78.0–100.0)
MONOS PCT: 11 % (ref 3–12)
Monocytes Absolute: 0.5 10*3/uL (ref 0.1–1.0)
NRBC: 3 /100{WBCs} — AB
Neutro Abs: 2.1 10*3/uL (ref 1.7–7.7)
Neutrophils Relative %: 47 % (ref 43–77)
Platelets: 25 10*3/uL — CL (ref 150–400)
RBC: 3.18 MIL/uL — AB (ref 4.22–5.81)
RDW: 13.1 % (ref 11.5–15.5)
WBC: 4.4 10*3/uL (ref 4.0–10.5)

## 2013-05-16 LAB — HEPATITIS B CORE ANTIBODY, IGM: Hep B C IgM: NONREACTIVE

## 2013-05-16 LAB — GLUCOSE, CAPILLARY
GLUCOSE-CAPILLARY: 90 mg/dL (ref 70–99)
GLUCOSE-CAPILLARY: 88 mg/dL (ref 70–99)
Glucose-Capillary: 108 mg/dL — ABNORMAL HIGH (ref 70–99)
Glucose-Capillary: 104 mg/dL — ABNORMAL HIGH (ref 70–99)

## 2013-05-16 LAB — COMPREHENSIVE METABOLIC PANEL
ALT: 26 U/L (ref 0–53)
AST: 61 U/L — AB (ref 0–37)
Albumin: 2 g/dL — ABNORMAL LOW (ref 3.5–5.2)
Alkaline Phosphatase: 255 U/L — ABNORMAL HIGH (ref 39–117)
BUN: 11 mg/dL (ref 6–23)
CALCIUM: 8.1 mg/dL — AB (ref 8.4–10.5)
CO2: 25 mEq/L (ref 19–32)
Chloride: 97 mEq/L (ref 96–112)
Creatinine, Ser: 0.77 mg/dL (ref 0.50–1.35)
GFR calc Af Amer: >90 mL/min (ref >90)
GFR calc non Af Amer: >90 mL/min (ref >90)
Glucose, Bld: 98 mg/dL (ref 70–99)
Potassium: 3.5 mEq/L — ABNORMAL LOW (ref 3.7–5.3)
SODIUM: 135 meq/L — AB (ref 137–147)
Total Bilirubin: 0.6 mg/dL (ref 0.3–1.2)
Total Protein: 5.2 g/dL — ABNORMAL LOW (ref 6.0–8.3)

## 2013-05-16 LAB — HEPATITIS B SURFACE ANTIBODY,QUALITATIVE: Hep B S Ab: NEGATIVE

## 2013-05-16 LAB — HEPATITIS B SURFACE ANTIGEN: HEP B S AG: NEGATIVE

## 2013-05-16 LAB — PHOSPHORUS: Phosphorus: 2.8 mg/dL (ref 2.3–4.6)

## 2013-05-16 MED ORDER — FLUDEOXYGLUCOSE F - 18 (FDG) INJECTION
10.0000 | Freq: Once | INTRAVENOUS | Status: AC | PRN
  Administered 2013-05-16: 10 via INTRAVENOUS

## 2013-05-16 MED ORDER — SODIUM CHLORIDE 0.9 % IV SOLN
6.0000 mg | Freq: Every day | INTRAVENOUS | Status: DC
  Administered 2013-05-16: 6 mg via INTRAVENOUS
  Filled 2013-05-16 (×2): qty 4

## 2013-05-16 MED ORDER — SODIUM BICARBONATE 8.4 % IV SOLN
INTRAVENOUS | Status: DC
  Filled 2013-05-16 (×2): qty 150

## 2013-05-16 MED ORDER — ALLOPURINOL 300 MG PO TABS
300.0000 mg | ORAL_TABLET | Freq: Every day | ORAL | Status: DC
  Administered 2013-05-16: 300 mg via ORAL
  Filled 2013-05-16: qty 1

## 2013-05-16 NOTE — Progress Notes (Signed)
Came to insert PICC however he wants to wait until tomorrow.   "I'm exhausted and just need some time to think about all of this too".  Primary RN made aware.  He did sign consent and was ok with having a PICC.

## 2013-05-16 NOTE — Progress Notes (Signed)
IV team came by to insert PICC this afternoon. Patient signed consent form, but then decided to hold off on the PICC placement for today. IV team will follow up tomorrow to place PICC. MD paged and made aware. Patient is aware that he will not receive chemo today.  Report called to Maudie Mercury on 3E. Will transfer patient.

## 2013-05-16 NOTE — Progress Notes (Signed)
I met for approximately 45 minutes with the patient, his wife Stanton Kidney and his daughter Milbert Coulter. We went over his diagnosis, prognosis, and treatment plan. We discussed the side effects toxicities and complications of chemotherapy and immunotherapy as well as steroid therapy.  The patient will need to be in the hospital for his first cycle of treatment because he is pancytopenic, with multiple electrolyte abnormalities, and at high risk for tumor lysis syndrome. Hopefully all other treatments can be done as outpatient.  [name] [name] is a good understanding of the overall situation. He agrees to the plan. He knows a goal of treatment in his case is cure. Hopefully we can start his right toxin today after he has his PET scan.

## 2013-05-16 NOTE — Progress Notes (Signed)
Attempted to provide education on R-CHOP.  Patient unable to stay awake and concentrate.  Patient requests I speak with wife.

## 2013-05-16 NOTE — Progress Notes (Signed)
[name] [name]   DOB:Sep 14, 1955   [id number]   GLO#:[id number]  Subjective: as documented in a separate note, met with patient, wife and daughter this AM tolet them know of diagnosis (high-grade B-cell NHL) and need to start Rx. Discussed possible side effects, complications and toxicities. Verbal consnt obtained   Objective: middle aged White male exmained in bed Filed Vitals:   05/16/13 0959  BP: 120/64  Pulse: 76  Temp:   Resp:     Body mass index is 32.07 kg/(m^2).  Intake/Output Summary (Last 24 hours) at 05/16/13 1223 Last data filed at 05/16/13 0700  Gross per 24 hour  Intake   2115 ml  Output      0 ml  Net   2115 ml   Sclerae unicteric, pupils round and equal Oropharynx clear and moist No cervical or supraclavicular adenopathy, no axillary adenopathy Lungs no rales or rhonchi Heart regular rate and rhythm Abd soft, obese, nontender, positive bowel sounds MSK no focal spinal tenderness Neuro: nonfocal, well oriented, appropriate affect    CBG (last 3)   Recent Labs  05/15/13 1708 05/15/13 2111 05/16/13 0808  GLUCAP 107* 127* 90     Labs:  Results for GABRIELLE, MESTER (MRN [id number]) as of 05/12/2013 09:30  Ref. Range 05/06/2013 21:00 05/12/2013 03:32  LDH Latest Range: 94-250 U/L 3192 (H) 3045 (H)    Lab Results  Component Value Date   WBC 4.4 05/16/2013   HGB 10.4* 05/16/2013   HCT 31.5* 05/16/2013   MCV 99.1 05/16/2013   PLT 25* 05/16/2013   NEUTROABS 2.1 05/16/2013    '@LASTCHEMISTRY'$ @  Urine Studies No results found for this basename: UACOL, UAPR, USPG, UPH, UTP, UGL, UKET, UBIL, UHGB, UNIT, UROB, ULEU, UEPI, UWBC, URBC, UBAC, CAST, CRYS, UCOM, BILUA,  in the last 72 hours  Basic Metabolic Panel:  Recent Labs Lab 05/12/13 1912 05/13/13 1124 05/14/13 0630 05/14/13 0956 05/15/13 1125 05/16/13 0347  NA 124* 128* 129*  --  133* 135*  K 4.3 4.1 3.8  --  3.4* 3.5*  CL 85* 88* 87*  --  97 97  CO2 $Re'26 25 26  'fcW$ --  23 25  GLUCOSE 162* 172* 102*  --  113* 98   BUN $Re'18 22 23  'osK$ --  14 11  CREATININE 0.78 0.84 0.82  --  0.68 0.77  CALCIUM 10.8* 10.6* 9.9 8.0* 8.7 8.1*   GFR Estimated Creatinine Clearance: 100 ml/min (by C-G formula based on Cr of 0.77). Liver Function Tests:  Recent Labs Lab 05/14/13 0630 05/16/13 0347  AST 82* 61*  ALT 30 26  ALKPHOS 268* 255*  BILITOT 0.8 0.6  PROT 6.2 5.2*  ALBUMIN 2.5* 2.0*   No results found for this basename: LIPASE, AMYLASE,  in the last 168 hours  Recent Labs Lab 05/15/13 1125  AMMONIA 35   Coagulation profile  Recent Labs Lab 05/11/13 0505 05/11/13 1055 05/15/13 0325  INR 1.31 1.29 1.26    CBC:  Recent Labs Lab 05/12/13 0332 05/13/13 0459 05/14/13 0630 05/15/13 0325 05/16/13 0347  WBC 4.2 4.0 5.0 3.1* 4.4  NEUTROABS 2.1  --   --   --  2.1  HGB 12.9* 13.4 12.6* 10.1* 10.4*  HCT 37.6* 39.2 36.7* 29.7* 31.5*  MCV 96.2 97.3 96.3 97.1 99.1  PLT 61* 50* 38* 28* 25*   Cardiac Enzymes: No results found for this basename: CKTOTAL, CKMB, CKMBINDEX, TROPONINI,  in the last 168 hours BNP: No components found with this basename: POCBNP,  CBG:  Recent Labs Lab 05/15/13 0823 05/15/13 1159 05/15/13 1708 05/15/13 2111 05/16/13 0808  GLUCAP 96 129* 107* 127* 90   D-Dimer No results found for this basename: DDIMER,  in the last 72 hours Hgb A1c No results found for this basename: HGBA1C,  in the last 72 hours Lipid Profile No results found for this basename: CHOL, HDL, LDLCALC, TRIG, CHOLHDL, LDLDIRECT,  in the last 72 hours Thyroid function studies No results found for this basename: TSH, T4TOTAL, FREET3, T3FREE, THYROIDAB,  in the last 72 hours Anemia work up No results found for this basename: VITAMINB12, FOLATE, FERRITIN, TIBC, IRON, RETICCTPCT,  in the last 72 hours Microbiology Recent Results (from the past 240 hour(s))  MRSA PCR SCREENING     Status: None   Collection Time    05/06/13  8:08 PM      Result Value Ref Range Status   MRSA by PCR NEGATIVE  NEGATIVE  Final   Comment:            The GeneXpert MRSA Assay (FDA     approved for NASAL specimens     only), is one component of a     comprehensive MRSA colonization     surveillance program. It is not     intended to diagnose MRSA     infection nor to guide or     monitor treatment for     MRSA infections.  CULTURE, BLOOD (ROUTINE X 2)     Status: None   Collection Time    05/12/13  8:40 PM      Result Value Ref Range Status   Specimen Description BLOOD RIGHT ARM   Final   Special Requests BOTTLES DRAWN AEROBIC ONLY 2CC   Final   Culture  Setup Time     Final   Value: 05/13/2013 02:22     Performed at [facility]   Culture     Final   Value:        BLOOD CULTURE RECEIVED NO GROWTH TO DATE CULTURE WILL BE HELD FOR 5 DAYS BEFORE ISSUING A FINAL NEGATIVE REPORT     Performed at [facility]   Report Status PENDING   Incomplete  CULTURE, BLOOD (ROUTINE X 2)     Status: None   Collection Time    05/12/13  8:56 PM      Result Value Ref Range Status   Specimen Description BLOOD RIGHT HAND   Final   Special Requests BOTTLES DRAWN AEROBIC ONLY 2CC   Final   Culture  Setup Time     Final   Value: 05/13/2013 02:22     Performed at [facility]   Culture     Final   Value:        BLOOD CULTURE RECEIVED NO GROWTH TO DATE CULTURE WILL BE HELD FOR 5 DAYS BEFORE ISSUING A FINAL NEGATIVE REPORT     Performed at [facility]   Report Status PENDING   Incomplete      Studies:  Ct Chest W Contrast  05/15/2013   CLINICAL DATA:  Staging of non-Hodgkin's lymphoma. No chest complaints. History of gastroesophageal reflux disease. Hypertension. Diabetes.  EXAM: CT CHEST WITH CONTRAST  TECHNIQUE: Multidetector CT imaging of the chest was performed during intravenous contrast administration.  CONTRAST:  70mL OMNIPAQUE IOHEXOL 300 MG/ML  SOLN  COMPARISON:  CT ANGIO CHEST W/CM &/OR WO/CM dated 04/23/2013; CT ENTERO ABD/PELVIS W/CM dated 05/10/2013; MR ABDOMEN WO/W CM dated  05/10/2013  FINDINGS: Lungs/Pleura: No pulmonary nodule or mass. Trace left pleural fluid which is new since 04/23/2013.  Heart/Mediastinum: No supraclavicular adenopathy. Bilateral gynecomastia, slightly greater on the left than right. Aortic and branch vessel atherosclerosis. Heart size upper normal, without pericardial effusion. Multivessel coronary artery atherosclerosis. No central pulmonary embolism, on this non-dedicated study. No mediastinal or hilar adenopathy.  Upper Abdomen: Mild cirrhosis. Liver lesions detailed on prior MRI are not readily apparent. Trace perihepatic fluid, new since 05/10/2013. Normal adrenal glands. Mildly prominent periportal nodes which are favored to be reactive and related to cirrhosis. Subtle low-density splenic lesions, better evaluated on prior MRI.  Bones/Musculoskeletal: Posttraumatic or developmental defect about the third and fourth anterior left ribs. No worrisome osseous lesion.  IMPRESSION: 1. No acute process or evidence of active lymphoma within the chest. 2. New trace left pleural fluid. 3. Bilateral gynecomastia. 4. Age advanced coronary artery atherosclerosis. Recommend assessment of coronary risk factors and consideration of medical therapy. 5. Mild cirrhosis with new perihepatic trace fluid since 05/10/2013.   Electronically Signed   By: Abigail Miyamoto M.D.   On: 05/15/2013 15:23   Ct Biopsy  05/15/2013   CLINICAL DATA:  Thrombocytopenia  EXAM: CT GUIDED DEEP ILIAC BONE ASPIRATION AND CORE BIOPSY  TECHNIQUE: The procedure, risks (including but not limited to bleeding, infection, organ damage ), benefits, and alternatives were explained to the patient. Questions regarding the procedure were encouraged and answered. The patient understands and consents to the procedure. Patient was placed supine on the CT gantry and limited axial scans through the pelvis were obtained. Appropriate skin entry site was identified. Skin site was marked, prepped with Betadine, draped in  usual sterile fashion, and infiltrated locally with 1% lidocaine.  Intravenous Fentanyl and Versed were administered as conscious sedation during continuous cardiorespiratory monitoring by the radiology RN, with a total moderate sedation time of less than 30 minutes.  Under CT fluoroscopic guidance an 11-gauge Cook trocar bone needle was advanced into the right iliac bone just lateral to the sacroiliac joint. Once needle tip position was confirmed, coaxial aspiration samples were attempted but not obtained. The needles reposition the second time but again no aspirate could be obtained. Two separate long core specimens were obtained using the needle itself, which was then removed. Post procedure scans show no hematoma or fracture. Patient tolerated procedure well, with no immediate complication.  IMPRESSION: 1. Technically successful CT guided right iliac bone core and aspiration biopsy.   Electronically Signed   By: Arne Cleveland M.D.   On: 05/15/2013 14:19   Patient: HENNING, EHLE Collected: 05/13/2013 Client: [facility] Accession: [id number] Received: 05/13/2013 John "Ronny Bacon, MD DOB: 17-Sep-1955 Age: 41 Gender: M Reported: 05/15/2013 [address] Patient Ph: [phone] MRN #: [id number] [place], [place] [address] Visit #: [id number] Chart #: Phone: [phone] Fax: CC: Hosie Poisson, MD REPORT OF SURGICAL PATHOLOGY FINAL DIAGNOSIS Diagnosis Lymph node for lymphoma, right external iliac - LARGE B-CELL LYMPHOMA. - SEE ONCOLOGY TABLE. Microscopic Comment LYMPHOMA Histologic type: Large B-cell lymphoma. Grade (if applicable): High grade. Flow cytometry: N/A. Immunohistochemical stains: CD20, CD79a, CD10, CD5, bcl-6, bcl-2, and CD3. Touch preps/imprints: N/A. Comments: Core biopsies reveal sheets of malignant pleomorphic cells with abundant necrosis and apoptotic debris, consistent with a high grade process. Immunohistochemistry reveals the cells are positive for CD20, CD79a, CD10,  bcl-6, and bcl-2. They are negative for CD3 and CD5, which highlights scattered T-cells. 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 The  case was discussed with Dr. Jana Hakim on 04/14/2013. Vicente Males MD Pathologist, Electronic Signature (Case signed 05/15/2013) Specimen Gross and Clinical Information Preliminary report on BMBX : diffusely positive; cytogenetics pending  Assessment: 58 y.o. [place] man admitted with multiple electrolyte abnormalities, hypercalcemia, pancytopenia, elevated LDH, with biopsy of R inguinal adenapoathy showing diffuse large-cell B-cell non-Hodgkin's lymphoma   Plan: (1) remaining studies: bone marrow biopsy, PET, cytogenetics/ FISH, echo, PICC line (port to follow) (2) to start R-CHOP ASAP: will start rituximab today, CHOP 05/17/2013 if echo done (3) high risk for tumor lysis; on allopurinol; maintain urine flow; monitor serum K+, Ca++, PO4-, LDH, uric acid  I will be glad to have patient transferred to my service. Thank you for your excellent care of this patient!   Chauncey Cruel, MD 05/16/2013  12:23 PM

## 2013-05-16 NOTE — Progress Notes (Signed)
Peripherally Inserted Central Catheter/Midline Placement  The IV Nurse has discussed with the patient and/or persons authorized to consent for the patient, the purpose of this procedure and the potential benefits and risks involved with this procedure.  The benefits include less needle sticks, lab draws from the catheter and patient may be discharged home with the catheter.  Risks include, but not limited to, infection, bleeding, blood clot (thrombus formation), and puncture of an artery; nerve damage and irregular heat beat.  Alternatives to this procedure were also discussed.  PICC/Midline Placement Documentation        Henderson [place] 05/16/2013, 2:35 PM

## 2013-05-16 NOTE — Progress Notes (Signed)
Per IR, unable to place PICC d/t machine malfunction. IVT made aware. MD called and confirmed okay to place PICC bedside per IVT RN. IVT RN made aware.

## 2013-05-16 NOTE — Progress Notes (Signed)
Oncology Nursing Spoke to patient's spouse via telephone regarding chemotherapy consent  and chemotherapy education.  Patient's spouse verified the "verbal" consent for chemotherapy.  Explained to spouse that we may or may not be able to start the Rituximab today; it is dependent on IV access.  We have all the teaching materials ready for her when she comes back.  Patient was definitely not ready earlier today.

## 2013-05-17 DIAGNOSIS — R740 Nonspecific elevation of levels of transaminase and lactic acid dehydrogenase [LDH]: Secondary | ICD-10-CM

## 2013-05-17 DIAGNOSIS — R7402 Elevation of levels of lactic acid dehydrogenase (LDH): Secondary | ICD-10-CM

## 2013-05-17 DIAGNOSIS — I517 Cardiomegaly: Secondary | ICD-10-CM

## 2013-05-17 LAB — URINALYSIS, ROUTINE W REFLEX MICROSCOPIC
Bilirubin Urine: NEGATIVE
GLUCOSE, UA: NEGATIVE mg/dL
Hgb urine dipstick: NEGATIVE
Ketones, ur: NEGATIVE mg/dL
Nitrite: NEGATIVE
Protein, ur: 30 mg/dL — AB
Specific Gravity, Urine: 1.037 — ABNORMAL HIGH (ref 1.005–1.030)
Urobilinogen, UA: 1 mg/dL (ref 0.0–1.0)
pH: 5.5 (ref 5.0–8.0)

## 2013-05-17 LAB — CBC WITH DIFFERENTIAL/PLATELET
BASOS PCT: 3 % — AB (ref 0–1)
Basophils Absolute: 0.2 10*3/uL — ABNORMAL HIGH (ref 0.0–0.1)
EOS ABS: 0.1 10*3/uL (ref 0.0–0.7)
Eosinophils Relative: 1 % (ref 0–5)
HEMATOCRIT: 34.7 % — AB (ref 39.0–52.0)
HEMOGLOBIN: 11.9 g/dL — AB (ref 13.0–17.0)
LYMPHS ABS: 2.4 10*3/uL (ref 0.7–4.0)
LYMPHS PCT: 40 % (ref 12–46)
MCH: 33.4 pg (ref 26.0–34.0)
MCHC: 34.3 g/dL (ref 30.0–36.0)
MCV: 97.5 fL (ref 78.0–100.0)
Monocytes Absolute: 0.6 10*3/uL (ref 0.1–1.0)
Monocytes Relative: 10 % (ref 3–12)
NEUTROS ABS: 2.7 10*3/uL (ref 1.7–7.7)
Neutrophils Relative %: 46 % (ref 43–77)
Platelets: 26 10*3/uL — CL (ref 150–400)
RBC: 3.56 MIL/uL — ABNORMAL LOW (ref 4.22–5.81)
RDW: 13.3 % (ref 11.5–15.5)
WBC: 6 10*3/uL (ref 4.0–10.5)

## 2013-05-17 LAB — GLUCOSE, CAPILLARY
GLUCOSE-CAPILLARY: 129 mg/dL — AB (ref 70–99)
GLUCOSE-CAPILLARY: 159 mg/dL — AB (ref 70–99)
Glucose-Capillary: 132 mg/dL — ABNORMAL HIGH (ref 70–99)
Glucose-Capillary: 165 mg/dL — ABNORMAL HIGH (ref 70–99)

## 2013-05-17 LAB — COMPREHENSIVE METABOLIC PANEL
ALK PHOS: 323 U/L — AB (ref 39–117)
ALT: 24 U/L (ref 0–53)
AST: 103 U/L — ABNORMAL HIGH (ref 0–37)
Albumin: 2 g/dL — ABNORMAL LOW (ref 3.5–5.2)
BUN: 9 mg/dL (ref 6–23)
CO2: 26 mEq/L (ref 19–32)
CREATININE: 0.79 mg/dL (ref 0.50–1.35)
Calcium: 8.2 mg/dL — ABNORMAL LOW (ref 8.4–10.5)
Chloride: 94 mEq/L — ABNORMAL LOW (ref 96–112)
GFR calc Af Amer: >90 mL/min (ref >90)
GFR calc non Af Amer: >90 mL/min (ref >90)
Glucose, Bld: 103 mg/dL — ABNORMAL HIGH (ref 70–99)
Potassium: 3.8 mEq/L (ref 3.7–5.3)
Sodium: 132 mEq/L — ABNORMAL LOW (ref 137–147)
TOTAL PROTEIN: 5 g/dL — AB (ref 6.0–8.3)
Total Bilirubin: 0.8 mg/dL (ref 0.3–1.2)

## 2013-05-17 LAB — URINE MICROSCOPIC-ADD ON

## 2013-05-17 LAB — URIC ACID: URIC ACID, SERUM: 0.2 mg/dL — AB (ref 4.0–7.8)

## 2013-05-17 LAB — PHOSPHORUS: Phosphorus: 3.3 mg/dL (ref 2.3–4.6)

## 2013-05-17 LAB — LACTATE DEHYDROGENASE: LDH: 2790 U/L — AB (ref 94–250)

## 2013-05-17 MED ORDER — METHYLPREDNISOLONE SODIUM SUCC 125 MG IJ SOLR
125.0000 mg | Freq: Once | INTRAMUSCULAR | Status: AC | PRN
  Filled 2013-05-17: qty 2

## 2013-05-17 MED ORDER — SODIUM CHLORIDE 0.9 % IJ SOLN: 3.0000 mL | INTRAMUSCULAR | Status: DC | PRN

## 2013-05-17 MED ORDER — SODIUM CHLORIDE 0.9 % IV SOLN
Freq: Once | INTRAVENOUS | Status: AC
  Administered 2013-05-17: 10:00:00 via INTRAVENOUS

## 2013-05-17 MED ORDER — EPINEPHRINE HCL 0.1 MG/ML IJ SOSY
0.2500 mg | PREFILLED_SYRINGE | Freq: Once | INTRAMUSCULAR | Status: AC | PRN
  Filled 2013-05-17: qty 10

## 2013-05-17 MED ORDER — ALTEPLASE 2 MG IJ SOLR
2.0000 mg | Freq: Once | INTRAMUSCULAR | Status: AC | PRN
  Filled 2013-05-17: qty 2

## 2013-05-17 MED ORDER — ALBUTEROL SULFATE (2.5 MG/3ML) 0.083% IN NEBU: 2.5000 mg | INHALATION_SOLUTION | Freq: Once | RESPIRATORY_TRACT | Status: AC | PRN

## 2013-05-17 MED ORDER — HEPARIN SOD (PORK) LOCK FLUSH 100 UNIT/ML IV SOLN: 500.0000 [IU] | Freq: Once | INTRAVENOUS | Status: DC | PRN

## 2013-05-17 MED ORDER — DOXORUBICIN HCL CHEMO IV INJECTION 2 MG/ML
50.0000 mg/m2 | Freq: Once | INTRAVENOUS | Status: AC
  Administered 2013-05-17: 98 mg via INTRAVENOUS
  Filled 2013-05-17: qty 49

## 2013-05-17 MED ORDER — CYCLOPHOSPHAMIDE CHEMO INJECTION 1 GM
750.0000 mg/m2 | Freq: Once | INTRAMUSCULAR | Status: AC
  Administered 2013-05-17: 1480 mg via INTRAVENOUS
  Filled 2013-05-17: qty 74

## 2013-05-17 MED ORDER — DIPHENHYDRAMINE HCL 50 MG/ML IJ SOLN: 25.0000 mg | Freq: Once | INTRAMUSCULAR | Status: AC | PRN

## 2013-05-17 MED ORDER — HEPARIN SOD (PORK) LOCK FLUSH 100 UNIT/ML IV SOLN: 250.0000 [IU] | Freq: Once | INTRAVENOUS | Status: DC | PRN

## 2013-05-17 MED ORDER — SODIUM CHLORIDE 0.9 % IJ SOLN: 10.0000 mL | INTRAMUSCULAR | Status: DC | PRN

## 2013-05-17 MED ORDER — DEXAMETHASONE SODIUM PHOSPHATE 10 MG/ML IJ SOLN
20.0000 mg | Freq: Once | INTRAMUSCULAR | Status: AC
  Administered 2013-05-17: 20 mg via INTRAVENOUS
  Filled 2013-05-17: qty 2

## 2013-05-17 MED ORDER — HOT PACK MISC ONCOLOGY
1.0000 | Freq: Once | Status: AC | PRN
  Filled 2013-05-17: qty 1

## 2013-05-17 MED ORDER — FAMOTIDINE IN NACL 20-0.9 MG/50ML-% IV SOLN
20.0000 mg | Freq: Two times a day (BID) | INTRAVENOUS | Status: DC
  Administered 2013-05-17 – 2013-05-20 (×6): 20 mg via INTRAVENOUS
  Filled 2013-05-17 (×7): qty 50

## 2013-05-17 MED ORDER — HEPARIN SOD (PORK) LOCK FLUSH 100 UNIT/ML IV SOLN
500.0000 [IU] | Freq: Once | INTRAVENOUS | Status: DC | PRN
Start: 2013-05-17 — End: 2013-05-17

## 2013-05-17 MED ORDER — EPINEPHRINE HCL 1 MG/ML IJ SOLN
0.5000 mg | Freq: Once | INTRAMUSCULAR | Status: AC | PRN
  Filled 2013-05-17: qty 1

## 2013-05-17 MED ORDER — PERFLUTREN LIPID MICROSPHERE
1.0000 mL | INTRAVENOUS | Status: AC | PRN
  Filled 2013-05-17: qty 10

## 2013-05-17 MED ORDER — SODIUM CHLORIDE 0.9 % IV SOLN: Freq: Once | INTRAVENOUS | Status: AC

## 2013-05-17 MED ORDER — DIPHENHYDRAMINE HCL 50 MG/ML IJ SOLN: 50.0000 mg | Freq: Once | INTRAMUSCULAR | Status: AC | PRN

## 2013-05-17 MED ORDER — FAMOTIDINE IN NACL 20-0.9 MG/50ML-% IV SOLN
20.0000 mg | Freq: Once | INTRAVENOUS | Status: AC | PRN
  Filled 2013-05-17: qty 50

## 2013-05-17 MED ORDER — DIPHENHYDRAMINE HCL 50 MG PO CAPS
50.0000 mg | ORAL_CAPSULE | Freq: Once | ORAL | Status: AC
  Administered 2013-05-17: 50 mg via ORAL
  Filled 2013-05-17: qty 1

## 2013-05-17 MED ORDER — SODIUM CHLORIDE 0.9 % IV SOLN
2.0000 mg | Freq: Once | INTRAVENOUS | Status: AC
  Administered 2013-05-17: 2 mg via INTRAVENOUS
  Filled 2013-05-17: qty 2

## 2013-05-17 MED ORDER — SODIUM CHLORIDE 0.9 % IJ SOLN
10.0000 mL | Freq: Two times a day (BID) | INTRAMUSCULAR | Status: DC
  Administered 2013-05-17 – 2013-05-22 (×9): 10 mL

## 2013-05-17 MED ORDER — ENSURE PUDDING PO PUDG
1.0000 | Freq: Three times a day (TID) | ORAL | Status: DC
  Administered 2013-05-17 – 2013-05-19 (×5): 1 via ORAL
  Filled 2013-05-17 (×15): qty 1

## 2013-05-17 MED ORDER — SODIUM CHLORIDE 0.9 % IV SOLN
Freq: Once | INTRAVENOUS | Status: AC
  Administered 2013-05-17: 16 mg via INTRAVENOUS
  Filled 2013-05-17: qty 8

## 2013-05-17 MED ORDER — COLD PACK MISC ONCOLOGY
1.0000 | Freq: Once | Status: AC | PRN
  Filled 2013-05-17: qty 1

## 2013-05-17 MED ORDER — SODIUM CHLORIDE 0.9 % IV SOLN
375.0000 mg/m2 | Freq: Once | INTRAVENOUS | Status: AC
  Administered 2013-05-17: 700 mg via INTRAVENOUS
  Filled 2013-05-17: qty 70

## 2013-05-17 MED ORDER — SODIUM CHLORIDE 0.9 % IV SOLN: Freq: Once | INTRAVENOUS | Status: AC | PRN

## 2013-05-17 MED ORDER — ACETAMINOPHEN 325 MG PO TABS
650.0000 mg | ORAL_TABLET | Freq: Once | ORAL | Status: AC
  Administered 2013-05-17: 650 mg via ORAL
  Filled 2013-05-17: qty 2

## 2013-05-17 MED ORDER — SODIUM CHLORIDE 0.9 % IV SOLN
6.0000 mg | INTRAVENOUS | Status: DC
  Filled 2013-05-17 (×2): qty 4

## 2013-05-17 MED ORDER — DEXAMETHASONE SODIUM PHOSPHATE 20 MG/5ML IJ SOLN: 20.0000 mg | Freq: Once | INTRAMUSCULAR | Status: DC

## 2013-05-17 NOTE — Progress Notes (Signed)
Echocardiogram 2D Echocardiogram has been performed.  [name] [name] [name] [name] 05/17/2013, 4:11 PM

## 2013-05-17 NOTE — Progress Notes (Signed)
Patient tolerated Adriamycin, Vincristine and Cytoxan without any complications.  Patient currently has Rituxan infusing at 200mg /hr without any evidence of hypersensitivity reaction.  Patient agreed to have Foley catheter placed per Dr. Virgie Dad order.  14Fr Foley placed with immediate return of light amber urine.  Patient uncomfortable during procedure but gradually more comfortable.  [name] [name]  05/17/2013  6:24 PM

## 2013-05-17 NOTE — Progress Notes (Signed)
Peripherally Inserted Central Catheter/Midline Placement  The IV Nurse has discussed with the patient and/or persons authorized to consent for the patient, the purpose of this procedure and the potential benefits and risks involved with this procedure.  The benefits include less needle sticks, lab draws from the catheter and patient may be discharged home with the catheter.  Risks include, but not limited to, infection, bleeding, blood clot (thrombus formation), and puncture of an artery; nerve damage and irregular heat beat.  Alternatives to this procedure were also discussed.  PICC/Midline Placement Documentation        Darlyn Read 05/17/2013, 8:40 AM

## 2013-05-17 NOTE — Progress Notes (Signed)
Nutrition Note   Per discussion with Nurse Tech, pt enjoying chocolate Ensure pudding and wife requesting pt receive more frequently. Increased supplement to TID with meals  Following per protocols. Please re-consult as needed   [place] Clinical Dietitian [phone]

## 2013-05-17 NOTE — Progress Notes (Signed)
[name] [name]   DOB:1955/10/20   [id number]   STM#:[id number]  Subjective: [name] [name] is still somewhat confused--wasn't sure of year even after I pointed out the almanac on his wall--but mentation has continued to improve as compared to last week when he was hypercalcemic. Met with daughter and wife separately and discussed results of PET, echo, BMBX. Cytogenetics and FISH are pending.   Objective: middle aged White [name] examined in bed Filed Vitals:   05/17/13 1530  BP: 106/58  Pulse: 80  Temp: 97.8 F (36.6 C)  Resp: 16    Body mass index is 31.91 kg/(m^2).  Intake/Output Summary (Last 24 hours) at 05/17/13 1616 Last data filed at 05/17/13 0546  Gross per 24 hour  Intake    365 ml  Output      0 ml  Net    365 ml     Sclerae unicteric, PERR  Oropharynx clear  No cervical, supraclavicular or axillary adenopathy  Lungs clear -- auscultated anterolaterally  Heart regular rate and rhythm  Abdomen soft, obese, NT, +BS  MSK no focal spinal tenderness, no peripheral edema  Neuro nonfocal, pleasant    CBG (last 3)   Recent Labs  05/16/13 2136 05/17/13 0739 05/17/13 1214  GLUCAP 104* 132* 165*     Labs:  Lab Results  Component Value Date   WBC 6.0 05/17/2013   HGB 11.9* 05/17/2013   HCT 34.7* 05/17/2013   MCV 97.5 05/17/2013   PLT 26* 05/17/2013   NEUTROABS 2.7 05/17/2013    '@LASTCHEMISTRY'$ @  Urine Studies No results found for this basename: UACOL, UAPR, USPG, UPH, UTP, UGL, UKET, UBIL, UHGB, UNIT, UROB, ULEU, UEPI, UWBC, URBC, UBAC, CAST, CRYS, UCOM, BILUA,  in the last 72 hours  Basic Metabolic Panel:  Recent Labs Lab 05/13/13 1124 05/14/13 0630 05/14/13 0956 05/15/13 1125 05/16/13 0347 05/17/13 0415  NA 128* 129*  --  133* 135* 132*  K 4.1 3.8  --  3.4* 3.5* 3.8  CL 88* 87*  --  97 97 94*  CO2 25 26  --  $R'23 25 26  'Vp$ GLUCOSE 172* 102*  --  113* 98 103*  BUN 22 23  --  $R'14 11 9  'YR$ CREATININE 0.84 0.82  --  0.68 0.77 0.79  CALCIUM 10.6* 9.9 8.0* 8.7 8.1*  8.2*  PHOS  --   --   --   --  2.8 3.3   GFR Estimated Creatinine Clearance: 99.9 ml/min (by C-G formula based on Cr of 0.79). Liver Function Tests:  Recent Labs Lab 05/14/13 0630 05/16/13 0347 05/17/13 0415  AST 82* 61* 103*  ALT $Re'30 26 24  'Xst$ ALKPHOS 268* 255* 323*  BILITOT 0.8 0.6 0.8  PROT 6.2 5.2* 5.0*  ALBUMIN 2.5* 2.0* 2.0*   No results found for this basename: LIPASE, AMYLASE,  in the last 168 hours  Recent Labs Lab 05/15/13 1125  AMMONIA 35   Coagulation profile  Recent Labs Lab 05/11/13 0505 05/11/13 1055 05/15/13 0325  INR 1.31 1.29 1.26    CBC:  Recent Labs Lab 05/12/13 0332 05/13/13 0459 05/14/13 0630 05/15/13 0325 05/16/13 0347 05/17/13 0415  WBC 4.2 4.0 5.0 3.1* 4.4 6.0  NEUTROABS 2.1  --   --   --  2.1 2.7  HGB 12.9* 13.4 12.6* 10.1* 10.4* 11.9*  HCT 37.6* 39.2 36.7* 29.7* 31.5* 34.7*  MCV 96.2 97.3 96.3 97.1 99.1 97.5  PLT 61* 50* 38* 28* 25* 26*   Cardiac Enzymes: No results found for this basename:  CKTOTAL, CKMB, CKMBINDEX, TROPONINI,  in the last 168 hours BNP: No components found with this basename: POCBNP,  CBG:  Recent Labs Lab 05/16/13 1200 05/16/13 1640 05/16/13 2136 05/17/13 0739 05/17/13 1214  GLUCAP 88 108* 104* 132* 165*   D-Dimer No results found for this basename: DDIMER,  in the last 72 hours Hgb A1c No results found for this basename: HGBA1C,  in the last 72 hours Lipid Profile No results found for this basename: CHOL, HDL, LDLCALC, TRIG, CHOLHDL, LDLDIRECT,  in the last 72 hours Thyroid function studies No results found for this basename: TSH, T4TOTAL, FREET3, T3FREE, THYROIDAB,  in the last 72 hours Anemia work up No results found for this basename: VITAMINB12, FOLATE, FERRITIN, TIBC, IRON, RETICCTPCT,  in the last 72 hours Microbiology Recent Results (from the past 240 hour(s))  CULTURE, BLOOD (ROUTINE X 2)     Status: None   Collection Time    05/12/13  8:40 PM      Result Value Ref Range Status    Specimen Description BLOOD RIGHT ARM   Final   Special Requests BOTTLES DRAWN AEROBIC ONLY 2CC   Final   Culture  Setup Time     Final   Value: 05/13/2013 02:22     Performed at [facility]   Culture     Final   Value:        BLOOD CULTURE RECEIVED NO GROWTH TO DATE CULTURE WILL BE HELD FOR 5 DAYS BEFORE ISSUING A FINAL NEGATIVE REPORT     Performed at [facility]   Report Status PENDING   Incomplete  CULTURE, BLOOD (ROUTINE X 2)     Status: None   Collection Time    05/12/13  8:56 PM      Result Value Ref Range Status   Specimen Description BLOOD RIGHT HAND   Final   Special Requests BOTTLES DRAWN AEROBIC ONLY 2CC   Final   Culture  Setup Time     Final   Value: 05/13/2013 02:22     Performed at [facility]   Culture     Final   Value:        BLOOD CULTURE RECEIVED NO GROWTH TO DATE CULTURE WILL BE HELD FOR 5 DAYS BEFORE ISSUING A FINAL NEGATIVE REPORT     Performed at [facility]   Report Status PENDING   Incomplete      Studies:  Nm Pet Image Initial (pi) Skull Base To Thigh  05/16/2013   CLINICAL DATA:  Initial treatment strategy for lymphoma.  EXAM: NUCLEAR MEDICINE PET SKULL BASE TO THIGH  TECHNIQUE: 10.0 mCi F-18 FDG was injected intravenously. Full-ring PET imaging was performed from the skull base to thigh after the radiotracer. CT data was obtained and used for attenuation correction and anatomic localization.  FASTING BLOOD GLUCOSE:  Value: 88 mg/dl  COMPARISON:  CT scan 05/10/2013  FINDINGS: NECK  No hypermetabolic lymph nodes in the neck.  CHEST  No hypermetabolic mediastinal or hilar nodes. No suspicious pulmonary nodules on the CT scan.  ABDOMEN/PELVIS  Interval development of diffuse mesenteric edema with free abdominal and pelvic fluid. The small bowel appears thickened. There are scattered mesenteric and retroperitoneal lymph nodes. There is diffuse and fairly marked FDG uptake in the small bowel, mesenteric and omentum. This could  reflect diffuse lymphoma but I think it is much more likely severe enteritis or possibly drug reaction from chemotherapy. The large nodal mass in the right pelvis is  markedly hypermetabolic with SUV max of 16.1.  There may be small metabolically active liver lesions but this is equivocal. I cannot really correlate these areas with the abnormal MRI.  SKELETON  Diffuse abnormal nodular increased uptake in the osseous structures concerning for osseous lymphoma.  IMPRESSION: 1. Large right pelvic nodal mass is FDG positive and consistent with known lymphoma. 2. Diffuse uptake in the mesentery and small bowel could be due to diffuse lymphoma involvement but is more likely a severe enteritis or drug reaction. 3. Diffuse abnormal FDG uptake in bones is suspicious diffuse lymphomatous involvement.   Electronically Signed   By: Kalman Jewels M.D.   On: 05/16/2013 14:27   Patient: [name] [name], [name] [name] Collected: 05/15/2013 Client: [facility] Accession: [id number] Received: 05/15/2013 D. Arne Cleveland DOB: 10-Nov-1955 Age: 58 Gender: M Reported: 05/17/2013 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 - SEE COMMENT. PERIPHERAL BLOOD: - PANCYTOPENIA ASSOCIATED WITH NEUTROPHILIC LEFT SHIFT. 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 (BNS:ecj 05/17/2013) Susanne Greenhouse MD Pathologist, Electronic Signature (Case signed 05/17/2013) GROSS AND MICROSCOPIC INFORMATION Specimen Clinical Information evaluate for NHL and persistent thrombocytopenia (kp) Source Bone Marrow Biopsy, right iliac Microscopic LAB DATA: CBC performed on 05/15/2013  shows: WBC 3.1 K/ul Neutrophils 36% HB 10.1 g/dl Lymphocytes 39% HCT 29.7 % Monocytes 10% MCV 97.1 fL Eosinophils 0% RDW 13.0 % Basophils 0% Metamyelocytes 1% PLT 28 K/ul Bands 13% Myelocytes 1% 1 of 2 FINAL for [name] [name], [name] [name] ([id number]) Microscopic(continued) PERIPHERAL BLOOD SMEAR: The red blood cells display mild anisopoikilocytosis with mild polychromasia. The white blood cells are decreased in number. The neutrophilic cells, in particular, display toxic granulation associated with left shift. The platelets are decreased in number. BONE MARROW ASPIRATE: No aspirate material is available for review. TOUCH PREPARATIONS: Scanty hematopoietic elements are seen with prominent degenerative cellular changes. In relatively intact areas, there is predominance of large mononuclear and multilobated cells with high nuclear cytoplasmic ratio, fine chromatin and small nucleoli. Some of the cells also contain intracytoplasmic vacuoles. Myeloid hematopoietic elements are decreased in number. CLOT and BIOPSY: The core biopsy shows extensive necrosis. In relatively intact areas, the medullary space displays diffuse sheets of large lymphoid appearing cells with mononuclear and multilobated nuclei, vesicular chromatin and small nucleoli. This is associated with a brisk mitosis. Scattered foci of residual trilineage hematopoiesis are present in the background. Immunohistochemical stains for BCL-2, BCL-6, CD3, CD10, CD20, LCA, CD79a, and CD138 were performed on block 1A with appropriate controls. The atypical lymphoid appearing cells are positive for LCA, CD20, CD79a, CD10 and BCL-2. There is patchy weak blush with BCL-6. No significant CD138 or CD3 positivity identified. IRON STAIN: Iron stains are performed on a bone marrow aspirate smear and section of clot. The controls stained appropriately. Storage Iron: Difficult to assess due to limited material. Ringed Sideroblasts: Difficult to assess due to  limited material. ADDITIONAL DATA / TESTING: The specimen was sent for cytogenetic analysis and a separate report will follow. Flow cytometric analysis  Assessment: 58 y.o. [name] [name] admitted with multiple electrolyte abnormalities, hypercalcemia, pancytopenia, elevated LDH, with biopsy of R inguinal adenapoathy showing diffuse large-cell B-cell non-Hodgkin's lymphoma, with bone marrow and mesenteric involvement  (1) started CHOP-R 05/17/2013  (2) at  high risk for tumor lysis  (a) NS at 125 cc/hr -- foley PRN  (b) rasburicase started 05/16/2013--reassess after 5 days  (c) daily Cmet, phosphorus, LDH, uric acid and CBC  (3) cirrhosis on scans but normal LFT's  (4) hypercalcemia of malignancy: s/p pamidronate 05/12/2013       Plan: we are finally starting chemotherapy today. I discussed the situation with the patient's wife and daughter this AM and they understand the risk of heart damage, bleeding, infection, immune reaction, and other possible toxicities, complications and side effects from treatment. They and the patient agree with the plan. We will consider CNS prophylaxis with cycle #2 given the tumor's high-risk features. Patient is full code   Chauncey Cruel, MD 05/17/2013  4:16 PM

## 2013-05-18 DIAGNOSIS — D6481 Anemia due to antineoplastic chemotherapy: Secondary | ICD-10-CM

## 2013-05-18 DIAGNOSIS — D6959 Other secondary thrombocytopenia: Secondary | ICD-10-CM

## 2013-05-18 DIAGNOSIS — F29 Unspecified psychosis not due to a substance or known physiological condition: Secondary | ICD-10-CM

## 2013-05-18 DIAGNOSIS — T451X5A Adverse effect of antineoplastic and immunosuppressive drugs, initial encounter: Secondary | ICD-10-CM

## 2013-05-18 LAB — LACTATE DEHYDROGENASE: LDH: 3320 U/L — ABNORMAL HIGH (ref 94–250)

## 2013-05-18 LAB — CBC WITH DIFFERENTIAL/PLATELET
Basophils Absolute: 0 10*3/uL (ref 0.0–0.1)
Basophils Relative: 1 % (ref 0–1)
EOS PCT: 1 % (ref 0–5)
Eosinophils Absolute: 0 10*3/uL (ref 0.0–0.7)
HCT: 29.2 % — ABNORMAL LOW (ref 39.0–52.0)
Hemoglobin: 9.8 g/dL — ABNORMAL LOW (ref 13.0–17.0)
Lymphocytes Relative: 37 % (ref 12–46)
Lymphs Abs: 0.8 10*3/uL (ref 0.7–4.0)
MCH: 32.9 pg (ref 26.0–34.0)
MCHC: 33.6 g/dL (ref 30.0–36.0)
MCV: 98 fL (ref 78.0–100.0)
Monocytes Absolute: 0.1 10*3/uL (ref 0.1–1.0)
Monocytes Relative: 4 % (ref 3–12)
NEUTROS PCT: 57 % (ref 43–77)
Neutro Abs: 1.2 10*3/uL — ABNORMAL LOW (ref 1.7–7.7)
PLATELETS: 19 10*3/uL — AB (ref 150–400)
RBC: 2.98 MIL/uL — AB (ref 4.22–5.81)
RDW: 13.5 % (ref 11.5–15.5)
WBC: 2.1 10*3/uL — AB (ref 4.0–10.5)

## 2013-05-18 LAB — COMPREHENSIVE METABOLIC PANEL
ALBUMIN: 1.8 g/dL — AB (ref 3.5–5.2)
ALK PHOS: 247 U/L — AB (ref 39–117)
ALT: 20 U/L (ref 0–53)
AST: 127 U/L — AB (ref 0–37)
BUN: 18 mg/dL (ref 6–23)
CHLORIDE: 102 meq/L (ref 96–112)
CO2: 23 mEq/L (ref 19–32)
CREATININE: 0.61 mg/dL (ref 0.50–1.35)
Calcium: 7.1 mg/dL — ABNORMAL LOW (ref 8.4–10.5)
GFR calc Af Amer: >90 mL/min (ref >90)
GFR calc non Af Amer: >90 mL/min (ref >90)
Glucose, Bld: 168 mg/dL — ABNORMAL HIGH (ref 70–99)
POTASSIUM: 4.2 meq/L (ref 3.7–5.3)
Sodium: 135 mEq/L — ABNORMAL LOW (ref 137–147)
Total Bilirubin: 0.4 mg/dL (ref 0.3–1.2)
Total Protein: 4.4 g/dL — ABNORMAL LOW (ref 6.0–8.3)

## 2013-05-18 LAB — URINALYSIS, ROUTINE W REFLEX MICROSCOPIC
Bilirubin Urine: NEGATIVE
Glucose, UA: NEGATIVE mg/dL
Ketones, ur: NEGATIVE mg/dL
LEUKOCYTES UA: NEGATIVE
Nitrite: NEGATIVE
Protein, ur: NEGATIVE mg/dL
Specific Gravity, Urine: 1.03 (ref 1.005–1.030)
UROBILINOGEN UA: 1 mg/dL (ref 0.0–1.0)
pH: 6 (ref 5.0–8.0)

## 2013-05-18 LAB — GLUCOSE, CAPILLARY
GLUCOSE-CAPILLARY: 223 mg/dL — AB (ref 70–99)
Glucose-Capillary: 148 mg/dL — ABNORMAL HIGH (ref 70–99)
Glucose-Capillary: 188 mg/dL — ABNORMAL HIGH (ref 70–99)
Glucose-Capillary: 173 mg/dL — ABNORMAL HIGH (ref 70–99)

## 2013-05-18 LAB — URIC ACID: URIC ACID, SERUM: 2.9 mg/dL — AB (ref 4.0–7.8)

## 2013-05-18 LAB — URINE MICROSCOPIC-ADD ON

## 2013-05-18 LAB — PHOSPHORUS: Phosphorus: 3.7 mg/dL (ref 2.3–4.6)

## 2013-05-18 MED ORDER — RASBURICASE 1.5 MG IV SOLR
6.0000 mg | Freq: Once | INTRAVENOUS | Status: AC
  Administered 2013-05-18: 6 mg via INTRAVENOUS
  Filled 2013-05-18: qty 4

## 2013-05-18 MED ORDER — SODIUM CHLORIDE 0.9 % IV SOLN
6.0000 mg | INTRAVENOUS | Status: DC
  Administered 2013-05-19 – 2013-05-20 (×2): 6 mg via INTRAVENOUS
  Filled 2013-05-18 (×3): qty 4

## 2013-05-18 NOTE — Progress Notes (Signed)
[name] [name]   DOB:September 23, 1955   [id number]   RAQ#:[id number]  Subjective: Mr Milstein is still is doing better this morning. He slept well without any complications. He appears to be slightly confused but more awake and engaged. He is able to identify himself, date of birth his location and the exact today's date. He still a bit confused about his diagnosis but he identified that he took chemotherapy yesterday. He is not reporting any pain or discomfort. He has not reported any nausea or vomiting or pruritus. He has not reported any bleeding. He is able to move his bowels without any complications. He still have a Foley catheter at this time.   Objective: Weight alert gentleman appeared in no active distress. Filed Vitals:   05/18/13 0556  BP: 104/61  Pulse: 69  Temp: 97.8 F (36.6 C)  Resp: 16    Body mass index is 31.91 kg/(m^2).  Intake/Output Summary (Last 24 hours) at 05/18/13 0748 Last data filed at 05/18/13 0554  Gross per 24 hour  Intake   4367 ml  Output    600 ml  Net   3767 ml     Sclerae unicteric, PERR  Oropharynx clear no oral ulcers or thrush.  No cervical, supraclavicular or axillary adenopathy  Lungs clear -- auscultated anterolaterally no wheezing.  Heart regular rate and rhythm  Abdomen soft, obese, NT, +BS  Extremities did not show any clubbing cyanosis or edema.  Neuro nonfocal, could not appreciate any deficits is able to move his all extremities. He is oriented to person place and time.    CBG (last 3)   Recent Labs  05/17/13 1720 05/17/13 2145 05/18/13 0743  GLUCAP 129* 159* 148*     Labs:  Lab Results  Component Value Date   WBC 2.1* 05/18/2013   HGB 9.8* 05/18/2013   HCT 29.2* 05/18/2013   MCV 98.0 05/18/2013   PLT PENDING 05/18/2013   NEUTROABS PENDING 05/18/2013     Basic Metabolic Panel:  Recent Labs Lab 05/14/13 0630 05/14/13 0956 05/15/13 1125 05/16/13 0347 05/17/13 0415 05/18/13 0545  NA 129*  --  133* 135* 132* 135*  K  3.8  --  3.4* 3.5* 3.8 4.2  CL 87*  --  97 97 94* 102  CO2 26  --  $R'23 25 26 23  'ln$ GLUCOSE 102*  --  113* 98 103* 168*  BUN 23  --  $R'14 11 9 18  'cZ$ CREATININE 0.82  --  0.68 0.77 0.79 0.61  CALCIUM 9.9 8.0* 8.7 8.1* 8.2* 7.1*  PHOS  --   --   --  2.8 3.3 3.7   GFR Estimated Creatinine Clearance: 99.9 ml/min (by C-G formula based on Cr of 0.61). Liver Function Tests:  Recent Labs Lab 05/14/13 0630 05/16/13 0347 05/17/13 0415 05/18/13 0545  AST 82* 61* 103* 127*  ALT $Re'30 26 24 20  'kqh$ ALKPHOS 268* 255* 323* 247*  BILITOT 0.8 0.6 0.8 0.4  PROT 6.2 5.2* 5.0* 4.4*  ALBUMIN 2.5* 2.0* 2.0* 1.8*   No results found for this basename: LIPASE, AMYLASE,  in the last 168 hours  Recent Labs Lab 05/15/13 1125  AMMONIA 35   Coagulation profile  Recent Labs Lab 05/11/13 1055 05/15/13 0325  INR 1.29 1.26      Studies:  Nm Pet Image Initial (pi) Skull Base To Thigh  05/16/2013   CLINICAL DATA:  Initial treatment strategy for lymphoma.  EXAM: NUCLEAR MEDICINE PET SKULL BASE TO THIGH  TECHNIQUE: 10.0 mCi F-18 FDG  was injected intravenously. Full-ring PET imaging was performed from the skull base to thigh after the radiotracer. CT data was obtained and used for attenuation correction and anatomic localization.  FASTING BLOOD GLUCOSE:  Value: 88 mg/dl  COMPARISON:  CT scan 05/10/2013  FINDINGS: NECK  No hypermetabolic lymph nodes in the neck.  CHEST  No hypermetabolic mediastinal or hilar nodes. No suspicious pulmonary nodules on the CT scan.  ABDOMEN/PELVIS  Interval development of diffuse mesenteric edema with free abdominal and pelvic fluid. The small bowel appears thickened. There are scattered mesenteric and retroperitoneal lymph nodes. There is diffuse and fairly marked FDG uptake in the small bowel, mesenteric and omentum. This could reflect diffuse lymphoma but I think it is much more likely severe enteritis or possibly drug reaction from chemotherapy. The large nodal mass in the right pelvis is  markedly hypermetabolic with SUV max of 01.6.  There may be small metabolically active liver lesions but this is equivocal. I cannot really correlate these areas with the abnormal MRI.  SKELETON  Diffuse abnormal nodular increased uptake in the osseous structures concerning for osseous lymphoma.  IMPRESSION: 1. Large right pelvic nodal mass is FDG positive and consistent with known lymphoma. 2. Diffuse uptake in the mesentery and small bowel could be due to diffuse lymphoma involvement but is more likely a severe enteritis or drug reaction. 3. Diffuse abnormal FDG uptake in bones is suspicious diffuse lymphomatous involvement.   Electronically Signed   By: Kalman Jewels M.D.   On: 05/16/2013 14:27  NE MARROW REPORT   Bone Marrow Biopsy, right iliac - HYPERCELLULAR BONE MARROW WITH PROMINENT INVOLVEMENT BY NON-HODGKIN'S B-CELL LYMPHOMA. - SEE COMMENT. PERIPHERAL BLOOD: - PANCYTOPENIA ASSOCIATED WITH NEUTROPHILIC LEFT SHIFT. 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  GROSS AND MICROSCOPIC INFORMATION Assessment and plan: 58 y.o. [place] man admitted with:   1. Diffuse large-cell B-cell non-Hodgkin's lymphoma, with bone marrow and mesenteric involvement. He started CHOP-R 05/17/2013 and tolerated it without any complications. For the time being we'll continue to monitor the sequela of his chemotherapy. His bone marrow biopsy and PET CT scan was reviewed as well.  2. Tumor lysis prophylaxis:  (a) NS at 125 cc/hr -- he continues to have a good urine output and his creatinine is normal. I intend to keep his Foley catheter at least for the next 48 hours.  (b) rasburicase started 05/16/2013--reassess after 5 days  (c) his electrolytes were reviewed today and continued to be stable. His calcium is slightly down and his sodium is improving. His uric acid continued to be low. His potassium is normal.  3. Thrombocytopenia  and mild anemia. This is related to his lymphoma, chemotherapy and possible cirrhosis of the liver. Spoke on is currently pending from today but does not have any sinus symptoms of bleeding. We will continue to monitor his platelets closely.  4. Hypercalcemia of malignancy: s/p pamidronate 05/12/2013. His calcium is back to normal.  5. Disposition and CODE STATUS: He is full code and not ready for discharge at this time.         Wyatt Portela, MD 05/18/2013  7:48 AM

## 2013-05-18 NOTE — Progress Notes (Signed)
Addendum: Platelet count count noted at 19,000 without any evidence of bleeding. No need for any transfusion or intervention at this time.

## 2013-05-18 NOTE — Progress Notes (Signed)
Message left for Dr Alen Blew on his phone with results of Platelets of 19.

## 2013-05-19 LAB — CULTURE, BLOOD (ROUTINE X 2)
CULTURE: NO GROWTH
Culture: NO GROWTH

## 2013-05-19 LAB — CBC WITH DIFFERENTIAL/PLATELET
BASOS ABS: 0 10*3/uL (ref 0.0–0.1)
Basophils Relative: 1 % (ref 0–1)
EOS PCT: 0 % (ref 0–5)
Eosinophils Absolute: 0 10*3/uL (ref 0.0–0.7)
HEMATOCRIT: 27.9 % — AB (ref 39.0–52.0)
Hemoglobin: 9.5 g/dL — ABNORMAL LOW (ref 13.0–17.0)
LYMPHS ABS: 0.6 10*3/uL — AB (ref 0.7–4.0)
Lymphocytes Relative: 30 % (ref 12–46)
MCH: 33.3 pg (ref 26.0–34.0)
MCHC: 34.1 g/dL (ref 30.0–36.0)
MCV: 97.9 fL (ref 78.0–100.0)
MONO ABS: 0 10*3/uL — AB (ref 0.1–1.0)
MONOS PCT: 1 % — AB (ref 3–12)
NEUTROS ABS: 1.5 10*3/uL — AB (ref 1.7–7.7)
Neutrophils Relative %: 68 % (ref 43–77)
Platelets: 16 10*3/uL — CL (ref 150–400)
RBC: 2.85 MIL/uL — ABNORMAL LOW (ref 4.22–5.81)
RDW: 13.4 % (ref 11.5–15.5)
WBC: 2.1 10*3/uL — AB (ref 4.0–10.5)

## 2013-05-19 LAB — COMPREHENSIVE METABOLIC PANEL
ALT: 18 U/L (ref 0–53)
AST: 79 U/L — ABNORMAL HIGH (ref 0–37)
Albumin: 1.8 g/dL — ABNORMAL LOW (ref 3.5–5.2)
Alkaline Phosphatase: 241 U/L — ABNORMAL HIGH (ref 39–117)
BILIRUBIN TOTAL: 0.3 mg/dL (ref 0.3–1.2)
BUN: 21 mg/dL (ref 6–23)
CHLORIDE: 103 meq/L (ref 96–112)
CO2: 22 mEq/L (ref 19–32)
Calcium: 6.8 mg/dL — ABNORMAL LOW (ref 8.4–10.5)
Creatinine, Ser: 0.59 mg/dL (ref 0.50–1.35)
GFR calc non Af Amer: >90 mL/min (ref >90)
GLUCOSE: 151 mg/dL — AB (ref 70–99)
Potassium: 4 mEq/L (ref 3.7–5.3)
Sodium: 134 mEq/L — ABNORMAL LOW (ref 137–147)
Total Protein: 4.2 g/dL — ABNORMAL LOW (ref 6.0–8.3)

## 2013-05-19 LAB — URINALYSIS, ROUTINE W REFLEX MICROSCOPIC
Bilirubin Urine: NEGATIVE
GLUCOSE, UA: NEGATIVE mg/dL
KETONES UR: NEGATIVE mg/dL
Leukocytes, UA: NEGATIVE
Nitrite: NEGATIVE
PROTEIN: NEGATIVE mg/dL
Specific Gravity, Urine: 1.024 (ref 1.005–1.030)
Urobilinogen, UA: 0.2 mg/dL (ref 0.0–1.0)
pH: 6 (ref 5.0–8.0)

## 2013-05-19 LAB — LACTATE DEHYDROGENASE: LDH: 2326 U/L — AB (ref 94–250)

## 2013-05-19 LAB — GLUCOSE, CAPILLARY
GLUCOSE-CAPILLARY: 159 mg/dL — AB (ref 70–99)
GLUCOSE-CAPILLARY: 110 mg/dL — AB (ref 70–99)
Glucose-Capillary: 131 mg/dL — ABNORMAL HIGH (ref 70–99)
Glucose-Capillary: 126 mg/dL — ABNORMAL HIGH (ref 70–99)

## 2013-05-19 LAB — URIC ACID: Uric Acid, Serum: 0.8 mg/dL — ABNORMAL LOW (ref 4.0–7.8)

## 2013-05-19 LAB — PHOSPHORUS: Phosphorus: 2.8 mg/dL (ref 2.3–4.6)

## 2013-05-19 LAB — URINE MICROSCOPIC-ADD ON

## 2013-05-19 MED ORDER — ALUM & MAG HYDROXIDE-SIMETH 200-200-20 MG/5ML PO SUSP
15.0000 mL | Freq: Four times a day (QID) | ORAL | Status: DC | PRN
  Administered 2013-05-19 – 2013-05-21 (×6): 15 mL via ORAL
  Filled 2013-05-19 (×6): qty 30

## 2013-05-19 NOTE — Progress Notes (Signed)
[name] [name]   DOB:August 21, 1955   [id number]   UXN#:[id number]  Subjective: [name] [name] is doing well at this time. No new complaints. No bleeding noted. He is less confused. He is reporting double vision at times.  No headaches or any neurological deficits.    Objective: Weight alert gentleman appeared in no active distress. Filed Vitals:   05/19/13 0550  BP: 110/68  Pulse: 59  Temp: 97.9 F (36.6 C)  Resp: 16    Body mass index is 31.91 kg/(m^2).  Intake/Output Summary (Last 24 hours) at 05/19/13 0740 Last data filed at 05/19/13 0551  Gross per 24 hour  Intake   1745 ml  Output   1253 ml  Net    492 ml     Sclerae unicteric, PERR  Oropharynx clear no oral ulcers or thrush.  No cervical, supraclavicular or axillary adenopathy  Lungs clear -- auscultated anterolaterally no wheezing.  Heart regular rate and rhythm  Abdomen soft, obese, NT, +BS  Extremities did not show any clubbing cyanosis or edema.  Neuro nonfocal, could not appreciate any deficits is able to move his all extremities. He is oriented to person place and time.   CN intact.     CBG (last 3)   Recent Labs  05/18/13 1226 05/18/13 1701 05/18/13 2137  GLUCAP 188* 223* 173*     Labs:  Lab Results  Component Value Date   WBC 2.1* 05/19/2013   HGB 9.5* 05/19/2013   HCT 27.9* 05/19/2013   MCV 97.9 05/19/2013   PLT 16* 05/19/2013   NEUTROABS 1.5* 05/19/2013     Basic Metabolic Panel:  Recent Labs Lab 05/15/13 1125 05/16/13 0347 05/17/13 0415 05/18/13 0545 05/19/13 0500  NA 133* 135* 132* 135* 134*  K 3.4* 3.5* 3.8 4.2 4.0  CL 97 97 94* 102 103  CO2 23 25 26 23 22   GLUCOSE 113* 98 103* 168* 151*  BUN 14 11 9 18 21   CREATININE 0.68 0.77 0.79 0.61 0.59  CALCIUM 8.7 8.1* 8.2* 7.1* 6.8*  PHOS  --  2.8 3.3 3.7 2.8   GFR Estimated Creatinine Clearance: 99.9 ml/min (by C-G formula based on Cr of 0.59). Liver Function Tests:  Recent Labs Lab 05/14/13 0630 05/16/13 0347 05/17/13 0415  05/18/13 0545 05/19/13 0500  AST 82* 61* 103* 127* 79*  ALT 30 26 24 20 18   ALKPHOS 268* 255* 323* 247* 241*  BILITOT 0.8 0.6 0.8 0.4 0.3  PROT 6.2 5.2* 5.0* 4.4* 4.2*  ALBUMIN 2.5* 2.0* 2.0* 1.8* 1.8*   No results found for this basename: LIPASE, AMYLASE,  in the last 168 hours  Recent Labs Lab 05/15/13 1125  AMMONIA 35      GROSS AND MICROSCOPIC INFORMATION Assessment and plan: 58 y.o. [name] [name] admitted with:   1. Diffuse large-cell B-cell non-Hodgkin's lymphoma, with bone marrow and mesenteric involvement. He started CHOP-R 05/17/2013 and tolerated it without any complications. For the time being we'll continue to monitor the sequela of his chemotherapy.   2. Tumor lysis prophylaxis:  (a) NS at 125 cc/hr -- he continues to have a good urine output and his creatinine is normal. I intend to keep his Foley catheter for another 24 hours and then recommend removing.   (b) rasburicase started 05/16/2013--reassess after 5 days  (c) his electrolytes were reviewed today and continued to be stable. His calcium is slightly down and his sodium is improving. His uric acid continued to be low. His potassium and phosphorous is normal.  3. Thrombocytopenia  and mild anemia. This is related to his lymphoma, chemotherapy and possible cirrhosis of the liver. Platelets are 16 K without bleeding. Will transfuse if he develops bleeding.   4. Hypercalcemia of malignancy: s/p pamidronate 05/12/2013. His calcium is back to normal.  5. Deconditioning: Will attempt to get him moving out of bed today and maybe PT evaluation as well.   6. Disposition and CODE STATUS: He is full code and not ready for discharge at this time.         Wyatt Portela, MD 05/19/2013  7:40 AM

## 2013-05-20 DIAGNOSIS — H532 Diplopia: Secondary | ICD-10-CM

## 2013-05-20 LAB — URIC ACID: URIC ACID, SERUM: 0.2 mg/dL — AB (ref 4.0–7.8)

## 2013-05-20 LAB — COMPREHENSIVE METABOLIC PANEL
ALBUMIN: 1.8 g/dL — AB (ref 3.5–5.2)
ALT: 19 U/L (ref 0–53)
AST: 60 U/L — AB (ref 0–37)
Alkaline Phosphatase: 238 U/L — ABNORMAL HIGH (ref 39–117)
BILIRUBIN TOTAL: 0.3 mg/dL (ref 0.3–1.2)
BUN: 16 mg/dL (ref 6–23)
CALCIUM: 6.7 mg/dL — AB (ref 8.4–10.5)
CHLORIDE: 105 meq/L (ref 96–112)
CO2: 22 mEq/L (ref 19–32)
CREATININE: 0.59 mg/dL (ref 0.50–1.35)
GFR calc Af Amer: >90 mL/min (ref >90)
GFR calc non Af Amer: >90 mL/min (ref >90)
Glucose, Bld: 136 mg/dL — ABNORMAL HIGH (ref 70–99)
Potassium: 3.9 mEq/L (ref 3.7–5.3)
Sodium: 136 mEq/L — ABNORMAL LOW (ref 137–147)
TOTAL PROTEIN: 4.1 g/dL — AB (ref 6.0–8.3)

## 2013-05-20 LAB — CBC WITH DIFFERENTIAL/PLATELET
BASOS ABS: 0 10*3/uL (ref 0.0–0.1)
BASOS PCT: 1 % (ref 0–1)
Eosinophils Absolute: 0 10*3/uL (ref 0.0–0.7)
Eosinophils Relative: 0 % (ref 0–5)
HEMATOCRIT: 30.2 % — AB (ref 39.0–52.0)
Hemoglobin: 10 g/dL — ABNORMAL LOW (ref 13.0–17.0)
Lymphocytes Relative: 19 % (ref 12–46)
Lymphs Abs: 0.4 10*3/uL — ABNORMAL LOW (ref 0.7–4.0)
MCH: 32.9 pg (ref 26.0–34.0)
MCHC: 33.1 g/dL (ref 30.0–36.0)
MCV: 99.3 fL (ref 78.0–100.0)
MONO ABS: 0 10*3/uL — AB (ref 0.1–1.0)
Monocytes Relative: 1 % — ABNORMAL LOW (ref 3–12)
NEUTROS ABS: 1.5 10*3/uL — AB (ref 1.7–7.7)
Neutrophils Relative %: 79 % — ABNORMAL HIGH (ref 43–77)
Platelets: 18 10*3/uL — CL (ref 150–400)
RBC: 3.04 MIL/uL — ABNORMAL LOW (ref 4.22–5.81)
RDW: 13.6 % (ref 11.5–15.5)
WBC: 1.9 10*3/uL — ABNORMAL LOW (ref 4.0–10.5)

## 2013-05-20 LAB — GLUCOSE, CAPILLARY
GLUCOSE-CAPILLARY: 87 mg/dL (ref 70–99)
Glucose-Capillary: 126 mg/dL — ABNORMAL HIGH (ref 70–99)
Glucose-Capillary: 155 mg/dL — ABNORMAL HIGH (ref 70–99)
Glucose-Capillary: 183 mg/dL — ABNORMAL HIGH (ref 70–99)

## 2013-05-20 LAB — PHOSPHORUS: PHOSPHORUS: 2.1 mg/dL — AB (ref 2.3–4.6)

## 2013-05-20 LAB — LACTATE DEHYDROGENASE: LDH: 1587 U/L — ABNORMAL HIGH (ref 94–250)

## 2013-05-20 MED ORDER — PANTOPRAZOLE SODIUM 40 MG PO TBEC
40.0000 mg | DELAYED_RELEASE_TABLET | Freq: Every day | ORAL | Status: DC
  Administered 2013-05-20 – 2013-05-22 (×3): 40 mg via ORAL
  Filled 2013-05-20 (×3): qty 1

## 2013-05-20 MED ORDER — PREDNISONE 50 MG PO TABS
60.0000 mg | ORAL_TABLET | Freq: Every day | ORAL | Status: DC
  Administered 2013-05-20 – 2013-05-22 (×3): 60 mg via ORAL
  Filled 2013-05-20 (×4): qty 1

## 2013-05-20 MED ORDER — FAMOTIDINE 20 MG PO TABS
20.0000 mg | ORAL_TABLET | Freq: Two times a day (BID) | ORAL | Status: DC
  Administered 2013-05-20 – 2013-05-22 (×5): 20 mg via ORAL
  Filled 2013-05-20 (×7): qty 1

## 2013-05-20 NOTE — Evaluation (Signed)
I have reviewed this note and agree with all findings. Kati Mahima Hottle, PT, DPT Pager: [phone]   

## 2013-05-20 NOTE — Evaluation (Addendum)
Occupational Therapy Evaluation Patient Details Name: [name] [name] MRN: [id number] DOB: 12/16/1955 Today's Date: 05/20/2013    History of Present Illness Pt is a 58 year old male admitted on 05/06/13 with chest pains and now found to have non-Hodgkin's lymphoma, with bone marrow and mesenteric involvement; PMH of hypertension, peptic ulcer disease, gastroesophageal reflux disease, diet-controlled type 2 diabetes, hyperlipidemia    Clinical Impression   Pt overall at min guard assist level for safety due to fatigue and diplopia which is impacting his ability to perform his ADL. He will benefit from acute OT to continue to assess and treat his visual deficits and progress his ADL independence and safety.    Follow Up Recommendations  Outpatient OT    Equipment Recommendations  None recommended by OT    Recommendations for Other Services       Precautions / Restrictions Precautions Precautions: None Precaution Comments: diplopia Restrictions Weight Bearing Restrictions: No      Mobility Bed Mobility Overal bed mobility: Needs Assistance Bed Mobility: Supine to Sit     Supine to sit: Supervision;HOB elevated     General bed mobility comments: verbal cues for safety and sequence; pt wanted to sit up with HOB elevated and used hand rails  Transfers Overall transfer level: Needs assistance Equipment used: Rolling walker (2 wheeled) Transfers: Sit to/from Stand Sit to Stand: Min guard         General transfer comment: verbal cues for safety and sequence, placement of UE; min guard as pt reported deconditioning and double vision    Balance                              ADL Overall ADL's : Needs assistance/impaired     Grooming: Wash/dry hands;Oral care;Minimal assistance;Standing (min guard)   Upper Body Bathing: Set up;Sitting   Lower Body Bathing: Minimal assistance;Sit to/from stand Lower Body Bathing Details (indicate cue type and reason): pt with  edema bilateral LE and unable to cross LEs up to wash/dress.  Upper Body Dressing : Set up;Sitting   Lower Body Dressing: Moderate assistance;Sit to/from stand; unable to reach to feet due to fatigue and LE edema bilaterally. Elevated LEs on a pillow.   Toilet Transfer: Administrator and Hygiene: Min guard;Sit to/from stand       Functional mobility during ADLs: Rolling walker General ADL Comments: Pt states he just feels really weak when up moving around with OT this am. He did well using the walker but has to compensate for the diplopia by closing his L eye. He states he starts to feel woozy/nauseous if he keeps both eyes open. He reports the diplopia started about a week ago and has been constant. Pt states he was fatigued after his walk with PT and then standing at the sink to brush his teeth. He requested return to bed. Discussed AE for LB ADL as pt now unable to reach to his feet for LB bathing/dressing but pt states his wife will help with this until he is able.     Vision        pt wears glasses to read. Diplopia present.      Diplopia Assessment: Disappears with one eye closed;Objects split side to side;Other (comment);Present in near gaze (pt reports single image in L peripheral field)       Perception     Praxis      Pertinent Vitals/Pain 8/10 chest  discomfort; reposition, informed nursing.     Hand Dominance     Extremity/Trunk Assessment Upper Extremity Assessment Upper Extremity Assessment: Generalized weakness   Lower Extremity Assessment Lower Extremity Assessment: Generalized weakness       Communication Communication Communication: No difficulties   Cognition Arousal/Alertness: Awake/alert Behavior During Therapy: WFL for tasks assessed/performed Overall Cognitive Status: Within Functional Limits for tasks assessed                     General Comments       Exercises       Shoulder Instructions       Home Living Family/patient expects to be discharged to:: Private residence Living Arrangements: Spouse/significant other;Children Available Help at Discharge: Family Type of Home: House Home Access: Stairs to enter Technical brewer of Steps: 6   Home Layout: Able to live on main level with bedroom/bathroom     Bathroom Shower/Tub: Tub/shower unit;Walk-in shower   Bathroom Toilet: Standard     Home Equipment: Environmental consultant - 2 wheels;Cane - quad          Prior Functioning/Environment Level of Independence: Independent             OT Diagnosis: Generalized weakness;Disturbance of vision   OT Problem List: Decreased strength;Decreased knowledge of use of DME or AE;Decreased activity tolerance;Impaired vision/perception   OT Treatment/Interventions: Self-care/ADL training;DME and/or AE instruction;Therapeutic activities;Patient/family education    OT Goals(Current goals can be found in the care plan section) Acute Rehab OT Goals Patient Stated Goal: return to more independence OT Goal Formulation: With patient Time For Goal Achievement: 06/03/13 Potential to Achieve Goals: Good ADL Goals Pt Will Perform Grooming: standing;with supervision (3 tasks) Pt Will Transfer to Toilet: with supervision;ambulating;regular height toilet Pt Will Perform Toileting - Clothing Manipulation and hygiene: with supervision;sit to/from stand Additional ADL Goal #1: Pt will tolerate continued assessment of diplopia and verbalize/demo independence with compensatory strategies.  OT Frequency: Min 2X/week   Barriers to D/C:            Co-evaluation              End of Session Equipment Utilized During Treatment: Rolling walker  Activity Tolerance: Patient limited by fatigue Patient left: in bed;with call bell/phone within reach;with family/visitor present   Time: 6213-0865 OT Time Calculation (min): 25 min Charges:  OT General Charges $OT Visit: 1 Procedure OT  Evaluation $Initial OT Evaluation Tier I: 1 Procedure OT Treatments $Self Care/Home Management : 8-22 mins G-Codes:    Alycia Patten Azia Toutant [phone] 05/20/2013, 1:31 PM

## 2013-05-20 NOTE — Evaluation (Signed)
Physical Therapy Evaluation Patient Details Name: [name] [name] MRN: [id number] DOB: 1955/09/30 Today's Date: 05/20/2013   History of Present Illness  Pt is a 58 year old male admitted on 05/06/13 with chest pains and now found to have non-Hodgkin's lymphoma, with bone marrow and mesenteric involvement; PMH of hypertension, peptic ulcer disease, gastroesophageal reflux disease, diet-controlled type 2 diabetes, hyperlipidemia   Clinical Impression  Pt admitted for chest pains and diagnosed with non-Hodgkin's lymphoma.  Pt currently with functional limitations due to the deficits listed below (see PT Problem List).  Pt is able to perform bed mobility and transfers with slight unsteadiness.  Pt able to ambulate in hallway reporting double vision and decreased activity tolerance from deconditioning during hospital stay.  Pt declined HHPT but would like to continue receiving acute PT during stay at [facility].  Pt will benefit from skilled PT to increase their independence and safety with mobility to allow discharge.    Follow Up Recommendations No PT follow up (pt delinced HHPT but would like PT follow-up while in hospital)    Equipment Recommendations  Rolling walker with 5" wheels    Recommendations for Other Services       Precautions / Restrictions Precautions Precautions: None Precaution Comments: diplopia Restrictions Weight Bearing Restrictions: No      Mobility  Bed Mobility Overal bed mobility: Needs Assistance Bed Mobility: Supine to Sit     Supine to sit: Supervision;HOB elevated     General bed mobility comments: verbal cues for safety and sequence; pt wanted to sit up with HOB elevated and used hand rails  Transfers Overall transfer level: Needs assistance Equipment used: Rolling walker (2 wheeled) Transfers: Sit to/from Stand Sit to Stand: Min guard         General transfer comment: verbal cues for safety and sequence, placement of UE; min guard as pt reported  deconditioning and double vision  Ambulation/Gait Ambulation/Gait assistance: Supervision Ambulation Distance (Feet): 160 Feet Assistive device: Rolling walker (2 wheeled) Gait Pattern/deviations: Drifts right/left Gait velocity: decr   General Gait Details: verbal cues for safety; instructed multiple times on walking within walker; pt reports double vision making walking difficuly as well as deconditioning from time spent in hospital which limits length of ambulation  Stairs            Wheelchair Mobility    Modified Rankin (Stroke Patients Only)       Balance Overall balance assessment: No apparent balance deficits (not formally assessed) Sitting-balance support: No upper extremity supported;Feet supported Sitting balance-Leahy Scale: Good     Standing balance support: Bilateral upper extremity supported;During functional activity Standing balance-Leahy Scale: Fair                               Pertinent Vitals/Pain Activity to tolerance.  Requested pain meds at end of treatment, nurse notified.    Home Living Family/patient expects to be discharged to:: Private residence Living Arrangements: Spouse/significant other;Children Available Help at Discharge: Family Type of Home: House Home Access: Stairs to enter   Technical brewer of Steps: [place]: Able to live on main level with bedroom/bathroom Home Equipment: Walker - 2 wheels;Cane - quad      Prior Function Level of Independence: Independent               Hand Dominance        Extremity/Trunk Assessment   Upper Extremity Assessment: Generalized weakness  Lower Extremity Assessment: Generalized weakness         Communication   Communication: No difficulties  Cognition Arousal/Alertness: Awake/alert Behavior During Therapy: WFL for tasks assessed/performed Overall Cognitive Status: Within Functional Limits for tasks assessed                       General Comments      Exercises        Assessment/Plan    PT Assessment Patient needs continued PT services  PT Diagnosis Generalized weakness   PT Problem List Decreased strength;Decreased activity tolerance;Decreased balance;Decreased knowledge of use of DME;Decreased mobility;Decreased coordination;Decreased safety awareness  PT Treatment Interventions DME instruction;Gait training;Stair training;Functional mobility training;Balance training;Therapeutic exercise;Therapeutic activities;Patient/family education   PT Goals (Current goals can be found in the Care Plan section) Acute Rehab PT Goals Patient Stated Goal: return to work PT Goal Formulation: With patient Time For Goal Achievement: 05/27/13 Potential to Achieve Goals: Good    Frequency Min 3X/week   Barriers to discharge        Co-evaluation               End of Session Equipment Utilized During Treatment: Gait belt Activity Tolerance: Patient tolerated treatment well Patient left: in chair;with nursing/sitter in room;Other (comment) (OT came into room to begin evaluation) Nurse Communication: Patient requests pain meds;Mobility status         Time: 0920-0947 PT Time Calculation (min): 27 min   Charges:   PT Evaluation $Initial PT Evaluation Tier I: 1 Procedure PT Treatments $Gait Training: 8-22 mins   PT G CodesJacqulyn Cane 06/04/13, 1:32 PM Jacqulyn Cane SPT 2013/06/04

## 2013-05-20 NOTE — Progress Notes (Signed)
This patient is receiving Pepcid. Based on criteria approved by the Pharmacy and Therapeutics Committee, this medication is being converted to the equivalent oral dose form. These criteria include:   . The patient is eating (either orally or per tube) and/or has been taking other orally administered medications for at least 24 hours.  . This patient has no evidence of active gastrointestinal bleeding or impaired GI absorption (gastrectomy, short bowel, patient on TNA or NPO).   If you have questions about this conversion, please contact the pharmacy department.  [place], [facility] 05/20/2013 9:00 AM

## 2013-05-20 NOTE — Progress Notes (Signed)
[name] [name]   DOB:16-Aug-1955   [id number]   ZCH#:[id number]  Subjective: uneventful weekend; mostly stayed in bed; no bleeding; daily soft BMs; c/o double vision; no h/a, N/V; no cough, phlegm, pleurisy; tolerating foley; no family in room  Objective: middle aged White man examined in bed Filed Vitals:   05/20/13 0543  BP: 102/54  Pulse: 66  Temp: 97.9 F (36.6 C)  Resp: 18    Body mass index is 31.91 kg/(m^2).  Intake/Output Summary (Last 24 hours) at 05/20/13 0727 Last data filed at 05/20/13 0500  Gross per 24 hour  Intake    840 ml  Output    950 ml  Net   -110 ml     Sclerae unicteric, PERR, EOMs intact, no nystagmus  Oropharynx clear--no thrush or other lesions  No cervical, supraclavicular or axillary adenopathy  Lungs clear -- auscultated anterolaterally  Heart regular rate and rhythm  Abdomen soft, obese, NT, +BS  Neuro nonfocal, well-oriented, pleasant    CBG (last 3)   Recent Labs  05/19/13 1148 05/19/13 1648 05/19/13 2218  GLUCAP 159* 110* 126*     Labs:  Lab Results  Component Value Date   WBC 2.1* 05/19/2013   HGB 9.5* 05/19/2013   HCT 27.9* 05/19/2013   MCV 97.9 05/19/2013   PLT 16* 05/19/2013   NEUTROABS 1.5* 05/19/2013    @LASTCHEMISTRY @  Urine Studies No results found for this basename: UACOL, UAPR, USPG, UPH, UTP, UGL, UKET, UBIL, UHGB, UNIT, UROB, ULEU, UEPI, UWBC, URBC, UBAC, CAST, CRYS, UCOM, BILUA,  in the last 72 hours  Basic Metabolic Panel:  Recent Labs Lab 05/15/13 1125 05/16/13 0347 05/17/13 0415 05/18/13 0545 05/19/13 0500  NA 133* 135* 132* 135* 134*  K 3.4* 3.5* 3.8 4.2 4.0  CL 97 97 94* 102 103  CO2 23 25 26 23 22   GLUCOSE 113* 98 103* 168* 151*  BUN 14 11 9 18 21   CREATININE 0.68 0.77 0.79 0.61 0.59  CALCIUM 8.7 8.1* 8.2* 7.1* 6.8*  PHOS  --  2.8 3.3 3.7 2.8   GFR Estimated Creatinine Clearance: 99.9 ml/min (by C-G formula based on Cr of 0.59). Liver Function Tests:  Recent Labs Lab 05/14/13 0630  05/16/13 0347 05/17/13 0415 05/18/13 0545 05/19/13 0500  AST 82* 61* 103* 127* 79*  ALT 30 26 24 20 18   ALKPHOS 268* 255* 323* 247* 241*  BILITOT 0.8 0.6 0.8 0.4 0.3  PROT 6.2 5.2* 5.0* 4.4* 4.2*  ALBUMIN 2.5* 2.0* 2.0* 1.8* 1.8*   No results found for this basename: LIPASE, AMYLASE,  in the last 168 hours  Recent Labs Lab 05/15/13 1125  AMMONIA 35   Coagulation profile  Recent Labs Lab 05/15/13 0325  INR 1.26    CBC:  Recent Labs Lab 05/15/13 0325 05/16/13 0347 05/17/13 0415 05/18/13 0545 05/19/13 0500  WBC 3.1* 4.4 6.0 2.1* 2.1*  NEUTROABS  --  2.1 2.7 1.2* 1.5*  HGB 10.1* 10.4* 11.9* 9.8* 9.5*  HCT 29.7* 31.5* 34.7* 29.2* 27.9*  MCV 97.1 99.1 97.5 98.0 97.9  PLT 28* 25* 26* 19* 16*   Cardiac Enzymes: No results found for this basename: CKTOTAL, CKMB, CKMBINDEX, TROPONINI,  in the last 168 hours BNP: No components found with this basename: POCBNP,  CBG:  Recent Labs Lab 05/18/13 2137 05/19/13 0739 05/19/13 1148 05/19/13 1648 05/19/13 2218  GLUCAP 173* 131* 159* 110* 126*   D-Dimer No results found for this basename: DDIMER,  in the last 72 hours Hgb A1c No results  found for this basename: HGBA1C,  in the last 72 hours Lipid Profile No results found for this basename: CHOL, HDL, LDLCALC, TRIG, CHOLHDL, LDLDIRECT,  in the last 72 hours Thyroid function studies No results found for this basename: TSH, T4TOTAL, FREET3, T3FREE, THYROIDAB,  in the last 72 hours Anemia work up No results found for this basename: VITAMINB12, FOLATE, FERRITIN, TIBC, IRON, RETICCTPCT,  in the last 72 hours Microbiology Recent Results (from the past 240 hour(s))  CULTURE, BLOOD (ROUTINE X 2)     Status: None   Collection Time    05/12/13  8:40 PM      Result Value Ref Range Status   Specimen Description BLOOD RIGHT ARM   Final   Special Requests BOTTLES DRAWN AEROBIC ONLY 2CC   Final   Culture  Setup Time     Final   Value: 05/13/2013 02:22     Performed at [facility]   Culture     Final   Value: NO GROWTH 5 DAYS     Performed at [facility]   Report Status 05/19/2013 FINAL   Final  CULTURE, BLOOD (ROUTINE X 2)     Status: None   Collection Time    05/12/13  8:56 PM      Result Value Ref Range Status   Specimen Description BLOOD RIGHT HAND   Final   Special Requests BOTTLES DRAWN AEROBIC ONLY [place]   Final   Culture  Setup Time     Final   Value: 05/13/2013 02:22     Performed at [facility]     Final   Value: NO GROWTH 5 DAYS     Performed at [facility]   Report Status 05/19/2013 FINAL   Final   Results for DERAL, SCHELLENBERG (MRN [id number]) as of 05/20/2013 07:42  Ref. Range 05/06/2013 21:00 05/12/2013 03:32 05/17/2013 04:15 05/18/2013 05:45 05/19/2013 05:00  LDH Latest Range: 94-250 U/L 3192 (H) 3045 (H) 2790 (H) 3320 (H) 2326 (H)   Results for MAXAMUS, COLAO (MRN [id number]) as of 05/20/2013 07:42  Ref. Range 05/16/2013 03:47 05/17/2013 04:15 05/18/2013 05:45 05/19/2013 05:00  Uric Acid, Serum Latest Range: 4.0-7.8 mg/dL 4.0 0.2 (L) 2.9 (L) 0.8 (L)   Results for QUINCE, SANTANA (MRN [id number]) as of 05/20/2013 07:42  Ref. Range 05/06/2013 19:48 05/16/2013 03:47 05/17/2013 04:15 05/18/2013 05:45 05/19/2013 05:00  Phosphorus Latest Range: 2.3-4.6 mg/dL 5.4 (H) 2.8 3.3 3.7 2.8   Studies:  No results found.  Assessment: 58 y.o. [place] man admitted with multiple electrolyte abnormalities, hypercalcemia, pancytopenia, elevated LDH, with biopsy of R inguinal adenapoathy showing diffuse large-cell B-cell non-Hodgkin's lymphoma, with bone marrow and mesenteric involvement  (1) started CHOP-R 05/17/2013-- tolerated well; will start "P" today, follow CBGs  (2) at high risk for tumor lysis  (a) NS at 125 cc/hr -- foley another 24 hours then PRN  (b) rasburicase started 05/16/2013--consider change to allopurinol tomorrow  (c) daily Cmet, phosphorus, LDH, uric acid and CBC  (3) cirrhosis on scans but normal LFT's  (4)  hypercalcemia of malignancy: s/p pamidronate 05/12/2013  (5) thrombocytopenia: no evidence of bleeding  (6) deconditioning: PT and OT to help [place] mobilize  (7) disposition: full code; home late this week  (8) hyponatremia: d/c demeclocycline and follow  (9) diplopia-- exam unrevealing, brain MRI 05/13/2013 showed unremarkable orbits-- follow     Plan: tolerated chemo well; so far no evidence of TLS--will continue to follow labs; will d/c IV morphine  and start to simplify care with a view to discharge late this week; starting prednisone today, to continue for 5 days  Chauncey Cruel, MD 05/20/2013  7:27 AM

## 2013-05-21 DIAGNOSIS — D649 Anemia, unspecified: Secondary | ICD-10-CM

## 2013-05-21 DIAGNOSIS — D709 Neutropenia, unspecified: Secondary | ICD-10-CM

## 2013-05-21 LAB — CBC WITH DIFFERENTIAL/PLATELET
BASOS PCT: 0 % (ref 0–1)
Basophils Absolute: 0 10*3/uL (ref 0.0–0.1)
EOS ABS: 0 10*3/uL (ref 0.0–0.7)
Eosinophils Relative: 0 % (ref 0–5)
HCT: 24.5 % — ABNORMAL LOW (ref 39.0–52.0)
HEMOGLOBIN: 8.4 g/dL — AB (ref 13.0–17.0)
Lymphocytes Relative: 36 % (ref 12–46)
Lymphs Abs: 0.5 10*3/uL — ABNORMAL LOW (ref 0.7–4.0)
MCH: 33.9 pg (ref 26.0–34.0)
MCHC: 34.3 g/dL (ref 30.0–36.0)
MCV: 98.8 fL (ref 78.0–100.0)
MONO ABS: 0 10*3/uL — AB (ref 0.1–1.0)
Monocytes Relative: 1 % — ABNORMAL LOW (ref 3–12)
NEUTROS PCT: 63 % (ref 43–77)
Neutro Abs: 0.8 10*3/uL — ABNORMAL LOW (ref 1.7–7.7)
Platelets: 13 10*3/uL — CL (ref 150–400)
RBC: 2.48 MIL/uL — ABNORMAL LOW (ref 4.22–5.81)
RDW: 13.6 % (ref 11.5–15.5)
WBC: 1.3 10*3/uL — CL (ref 4.0–10.5)

## 2013-05-21 LAB — GLUCOSE, CAPILLARY
GLUCOSE-CAPILLARY: 119 mg/dL — AB (ref 70–99)
GLUCOSE-CAPILLARY: 211 mg/dL — AB (ref 70–99)
GLUCOSE-CAPILLARY: 196 mg/dL — AB (ref 70–99)
Glucose-Capillary: 84 mg/dL (ref 70–99)

## 2013-05-21 LAB — COMPREHENSIVE METABOLIC PANEL
ALT: 16 U/L (ref 0–53)
AST: 37 U/L (ref 0–37)
Albumin: 1.7 g/dL — ABNORMAL LOW (ref 3.5–5.2)
Alkaline Phosphatase: 193 U/L — ABNORMAL HIGH (ref 39–117)
BUN: 12 mg/dL (ref 6–23)
CALCIUM: 6.7 mg/dL — AB (ref 8.4–10.5)
CO2: 25 meq/L (ref 19–32)
Chloride: 106 mEq/L (ref 96–112)
Creatinine, Ser: 0.53 mg/dL (ref 0.50–1.35)
GLUCOSE: 93 mg/dL (ref 70–99)
Potassium: 4 mEq/L (ref 3.7–5.3)
Sodium: 137 mEq/L (ref 137–147)
Total Bilirubin: 0.4 mg/dL (ref 0.3–1.2)
Total Protein: 3.8 g/dL — ABNORMAL LOW (ref 6.0–8.3)

## 2013-05-21 LAB — URINALYSIS, ROUTINE W REFLEX MICROSCOPIC
BILIRUBIN URINE: NEGATIVE
Glucose, UA: NEGATIVE mg/dL
Hgb urine dipstick: NEGATIVE
KETONES UR: NEGATIVE mg/dL
Leukocytes, UA: NEGATIVE
NITRITE: NEGATIVE
Protein, ur: NEGATIVE mg/dL
Specific Gravity, Urine: 1.022 (ref 1.005–1.030)
UROBILINOGEN UA: 0.2 mg/dL (ref 0.0–1.0)
pH: 5.5 (ref 5.0–8.0)

## 2013-05-21 LAB — HEPARIN INDUCED THROMBOCYTOPENIA PNL
HEPARIN INDUCED PLT AB: NEGATIVE
Patient O.D.: 0.029
UFH High Dose UFH H: 0 % Release
UFH Low Dose 0.1 IU/mL: 0 % Release
UFH Low Dose 0.5 IU/mL: 0 % Release
UFH SRA Result: NEGATIVE

## 2013-05-21 LAB — PHOSPHORUS: PHOSPHORUS: 2.1 mg/dL — AB (ref 2.3–4.6)

## 2013-05-21 LAB — URIC ACID: Uric Acid, Serum: 0.2 mg/dL — ABNORMAL LOW (ref 4.0–7.8)

## 2013-05-21 LAB — LACTATE DEHYDROGENASE: LDH: 1152 U/L — ABNORMAL HIGH (ref 94–250)

## 2013-05-21 MED ORDER — ENSURE COMPLETE PO LIQD
237.0000 mL | Freq: Three times a day (TID) | ORAL | Status: DC
  Administered 2013-05-22 (×2): 237 mL via ORAL

## 2013-05-21 MED ORDER — TBO-FILGRASTIM 300 MCG/0.5ML ~~LOC~~ SOSY
300.0000 ug | PREFILLED_SYRINGE | Freq: Every day | SUBCUTANEOUS | Status: DC
  Administered 2013-05-22: 300 ug via SUBCUTANEOUS
  Filled 2013-05-21 (×3): qty 0.5

## 2013-05-21 MED ORDER — METOCLOPRAMIDE HCL 5 MG PO TABS
5.0000 mg | ORAL_TABLET | Freq: Three times a day (TID) | ORAL | Status: DC
  Administered 2013-05-21 – 2013-05-22 (×3): 5 mg via ORAL
  Filled 2013-05-21 (×5): qty 1

## 2013-05-21 MED ORDER — ALLOPURINOL 300 MG PO TABS
300.0000 mg | ORAL_TABLET | Freq: Every day | ORAL | Status: DC
  Administered 2013-05-21 – 2013-05-22 (×2): 300 mg via ORAL
  Filled 2013-05-21 (×2): qty 1

## 2013-05-21 MED ORDER — ENSURE PUDDING PO PUDG
1.0000 | ORAL | Status: DC
  Administered 2013-05-22: 1 via ORAL
  Filled 2013-05-21: qty 1

## 2013-05-21 NOTE — Progress Notes (Signed)
CRITICAL VALUE ALERT  Critical value received:  WBC 1.3; Platelets 13  Date of notification:  05/21/13  Time of notification:  0640  Critical value read back:Yes  Nurse who received alert:  Azzie Glatter, RN  MD notified (1st page):  N/A (oncologist making rounds now); Lab values fairly consistent w/ previous labs.  Time of first page:  n/a  MD notified (2nd page): n/a  Time of second page:n/a  Responding MD:  n/a  Time MD responded:  n/a

## 2013-05-21 NOTE — Progress Notes (Signed)
Occupational Therapy Treatment Patient Details Name: [name] [name] MRN: [id number] DOB: April 05, 1955 Today's Date: 05/21/2013    History of present illness Pt is a 58 year old male admitted on 05/06/13 with chest pains and now found to have non-Hodgkin's lymphoma, with bone marrow and mesenteric involvement; PMH of hypertension, peptic ulcer disease, gastroesophageal reflux disease, diet-controlled type 2 diabetes, hyperlipidemia    OT comments  Reassessed use of partial occlusion therapy. Daughter had taped up entire R lens. Lens had to be retaped. Had explained purpose of partial occlusion to pt at earlier session. Educated nsg on need to use lens. After lens taped, pt reports that his diplopia is much better. Will follow up with pt tomorrow and adjust as needed.  Follow Up Recommendations  Outpatient OT;Other (comment)    Equipment Recommendations  None recommended by OT    Recommendations for Other Services      Precautions / Restrictions Precautions Precautions: None       Mobility Bed Mobility                  Transfers                      Balance                                   ADL                                                Vision                 Additional Comments: Pt seen for additional session to follow up on spot occlusion. Pt not wearing lasses on entry. Pt's daughter had apparently taped up the entire side of his R lens. spot redone on lens and pt again educated on the purpose of spot occlusion. Pt stated he verballed understood.  (not seeing double with use of occlusion)   Perception     Praxis      Cognition   Behavior During Therapy: WFL for tasks assessed/performed Overall Cognitive Status: Within Functional Limits for tasks assessed                       Extremity/Trunk Assessment               Exercises     Shoulder Instructions       General Comments       Pertinent Vitals/ Pain       no apparent distress   Home Living                                          Prior Functioning/Environment              Frequency Min 2X/week     Progress Toward Goals  OT Goals(current goals can now be found in the care plan section)  Progress towards OT goals: Progressing toward goals  Acute Rehab OT Goals Patient Stated Goal: return to work OT Goal Formulation: With patient Time For Goal Achievement: 06/03/13 Potential to Achieve Goals: Good ADL Goals Pt Will Perform Grooming: standing;with supervision Pt Will Transfer to  Toilet: with supervision;ambulating;regular height toilet Pt Will Perform Toileting - Clothing Manipulation and hygiene: with supervision;sit to/from stand Additional ADL Goal #1: Pt will tolerate continued assessment of diplopia and verbalize/demo independence with compensatory strategies. Additional ADL Goal #2: Pt will independently guide care giver in use of spot occlusion for decreasing diplopia in R Gaze  Plan Discharge plan remains appropriate    Co-evaluation                 End of Session     Activity Tolerance Patient tolerated treatment well   Patient Left in bed;with call bell/phone within reach   Nurse Communication Mobility status;Other (comment) (use of spot occlusion)        Time: 1656-1710 OT Time Calculation (min): 14 min  Charges: OT General Charges $OT Visit: 1 Procedure OT Treatments $Therapeutic Activity: 8-22 mins  Roney Jaffe Selah Zelman 05/21/2013, 5:28 PM   [facility], OTR/L  [phone] 05/21/2013

## 2013-05-21 NOTE — Progress Notes (Signed)
NUTRITION FOLLOW UP  Intervention:   -Recommend chocolate Ensure Complete po TID, each supplement provides 350 kcal and 13 grams of protein -Modify chocolate Ensure Pudding po Q24H, each supplement provides 170 kcal and 4 grams of protein -Continue with liberalized diet to encourage PO intake -Will continue to monitor  Nutrition Dx:   Predicted sub-optimal intake related to decreased appetite and excessive fluid intake AEB diet hx-ongoing    Goal:   Intake of meals to meet >75% estimated needs-not met   Monitor:   Total protein/energy intake, labs, weights,  Assessment:   3/31: Patient admitted with severe hyponatremia. Patient states that prior to admit, his mouth would stay dry and he was drinking 2-3 12 ounce bottles of water every hour while awake (20-25 daily per patient}. HgbA1C 6.1 04/25/2013 and improved from last year. States that he has been trying to eat more healthfully recently. Some difficulty swallowing solids at times with hx or reflux noted  4/07: -Pt continues with hyponatremia, but improving per MD -Fluid restriction removed, currently on regular diet, eating approximately 25% of meals. Denied nausea/vomiting, just noted overall continued decreased appetite and early satiety. Has been trying to eat more, declined Ensure/Boost supplement as he was not sure he would be able to tolerate that amount of volume -Was willing to trial Ensure pudding, chocolate flavor only -Pt to be NPO later today for bone marrow biopsy to r/o NH lymphoma.  -Possible NHL dx and confusion from hyponatremia also likely contributing to poor PO -EGD on 4/04 indicated minimal gastritis. Pt denied any abd pain post meals or difficulty tolerating solid foods/regular diet texture -Weight decreased 9 lbs in one week, likely combination of poor PO and fluid status. Is +1682 ml  4/10: Per discussion with Nurse Tech, pt enjoying chocolate Ensure pudding and wife requesting pt receive more frequently.  Increased supplement to TID with meals  4/14: -Pt with varying PO intake, 50-100% -Enjoys the chocolate Ensure, however, will not eat supplement if he receives vanilla flavor. Two Ensure pudding were untouched in pt's room during time of assessment -Will modify to receive EP once daily with emphasis on he receive chocolate flavor -Sodium levels now WNL. 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 Has begun chemo treatments. Denied any nausea/vomiting, taste changes post treatments. MD noted pt with reflux; however, pt reported tolerating regular diet textures -Phos Low -Alk phos elevated but trending downward -Continue with liberalized diet to encourage PO intake  Height: Ht Readings from Last 1 Encounters:  05/16/13 _0  (1.626 m)    Weight Status:   Wt Readings from Last 1 Encounters:  05/16/13 186 lb (84.369 kg)  05/06/13 188 lbs  Re-estimated needs:  Kcal: 1900-2000  Protein: 70-80 gm  Fluid: 1.9-2L daily  Skin: WDL, nonpitting RLE and LLE  Diet Order: General   Intake/Output Summary (Last 24 hours) at 05/21/13 1345 Last data filed at 05/21/13 1009  Gross per 24 hour  Intake   9097 ml  Output   1200 ml  Net   7897 ml    Last BM: 4/13   Labs:   Recent Labs Lab 05/19/13 0500 05/20/13 1020 05/21/13 0600  NA 134* 136* 137  K 4.0 3.9 4.0  CL 103 105 106  CO2 _1 BUN _2 CREATININE 0.59 0.59 0.53  CALCIUM 6.8* 6.7* 6.7*  PHOS 2.8 2.1* 2.1*  GLUCOSE 151* 136* 93  CBG (last 3)   Recent Labs  05/20/13 2100 05/21/13 0757 05/21/13 1207  GLUCAP 183* 84 119*    Scheduled Meds: . allopurinol  300 mg Oral Daily  . famotidine  20 mg Oral BID  . feeding supplement (ENSURE COMPLETE)  237 mL Oral TID WC  . [START ON 05/22/2013] feeding supplement (ENSURE)  1 Container Oral Q24H  . insulin aspart  0-15 Units Subcutaneous TID WC  . metoCLOPramide  5  mg Oral TID AC  . metoprolol succinate  100 mg Oral Daily  . pantoprazole  40 mg Oral Q1200  . polyethylene glycol  17 g Oral Daily  . predniSONE  60 mg Oral Q breakfast  . senna-docusate  2 tablet Oral BID  . sodium chloride  10-40 mL Intracatheter Q12H  . [START ON 05/22/2013] Tbo-Filgrastim  300 mcg Subcutaneous Q0600    Continuous Infusions: . sodium chloride 125 mL/hr at 05/21/13 [place] LDN Clinical Dietitian [phone]

## 2013-05-21 NOTE — Progress Notes (Signed)
Occupational Therapy Treatment Patient Details Name: [name] [name] MRN: [id number] DOB: [birth year] Today's Date: 05/21/2013    History of present illness Pt is a 58 year old male admitted on 05/06/13 with chest pains and now found to have non-Hodgkin's lymphoma, with bone marrow and mesenteric involvement; PMH of hypertension, peptic ulcer disease, gastroesophageal reflux disease, diet-controlled type 2 diabetes, hyperlipidemia    OT comments  Session focused on further assessing diplopia and use of spot occlusion to decrease symptoms of diplopia. Pt c/o horizontal and vertical diplopia in R gaze only. Pt with apparent decreased occular ROM R eye with adduction in upward and downward gaze. Pt able to fuse on object and extinguish diplopia @ 10 degrees from midline to R. Spot occlusion used on R lens to decrease c/o diplopia in R gaze. Educated pt on use of spot occlusion and pt demonstrated verbal understanding. Rec for pt to follow up with the neuro out pt OT, in addition to following up with his eye doctor. Discussed need to refrain from driving. Will return this pm to assess progress with use of spot occlusion.   Follow Up Recommendations  Outpatient OT;Other (comment) (neuro outpatient OT)  Follow up with pt's eye doctor   Equipment Recommendations  None recommended by OT    Recommendations for Other Services      Precautions / Restrictions Precautions Precautions: None Restrictions Weight Bearing Restrictions: No       Mobility Bed Mobility Overal bed mobility: Modified Independent                Transfers Overall transfer level: Needs assistance   Transfers: Sit to/from Stand;Stand Pivot Transfers Sit to Stand: Supervision Stand pivot transfers: Supervision            Balance Overall balance assessment: Needs assistance Sitting-balance support: Feet supported;No upper extremity supported Sitting balance-Leahy Scale: Good     Standing balance support:  During functional activity;No upper extremity supported Standing balance-Leahy Scale: Fair                     ADL Overall ADL's : Needs assistance/impaired                                       General ADL Comments: focus of session on assessing vision. pt c/o feeling of "haivng to pee constantly" although he has foley in. nsg aware      Vision Eye Alignment: Impaired (comment) Alignment/Gaze Preference: Within Defined Limits Ocular Range of Motion: Restricted on the right;Restricted looking up;Restricted looking down Tracking/Visual Pursuits: Right eye does not track laterally;Decreased smoothness of horizontal tracking;Decreased smoothness of vertical tracking;Other (comment) (difficulty with R eye moving vertically to end ranges) Saccades: Additional head turns occurred during testing;Decreased speed of saccadic movement Convergence: Within functional limits Diplopia Assessment: Disappears with one eye closed;Objects split side to side;Objects split on top of one another;Only with right gaze Depth Perception: Overshoots Additional Comments: Appears to demonstrate multiple CN involvement   Perception     Praxis      Cognition   Behavior During Therapy: WFL for tasks assessed/performed Overall Cognitive Status: Within Functional Limits for tasks assessed                       Extremity/Trunk Assessment               Exercises     Shoulder  Instructions       General Comments      Pertinent Vitals/ Pain       2/10 HA       nsg asked for pain meds/given  Home Living    see eval                                      Prior Functioning/Environment     see eval         Frequency Min 2X/week     Progress Toward Goals  OT Goals(current goals can now be found in the care plan section)  Progress towards OT goals: Progressing toward goals  Acute Rehab OT Goals Patient Stated Goal: return to work OT Goal  Formulation: With patient Time For Goal Achievement: 06/03/13 Potential to Achieve Goals: Good ADL Goals Pt Will Perform Grooming: standing;with supervision Pt Will Transfer to Toilet: with supervision;ambulating;regular height toilet Pt Will Perform Toileting - Clothing Manipulation and hygiene: with supervision;sit to/from stand Additional ADL Goal #1: Pt will tolerate continued assessment of diplopia and verbalize/demo independence with compensatory strategies.  Plan Discharge plan remains appropriate    Co-evaluation                 End of Session     Activity Tolerance Patient tolerated treatment well   Patient Left in bed;with call bell/phone within reach   Nurse Communication Mobility status;Patient requests pain meds        Time: 1003-1039 OT Time Calculation (min): 36 min  Charges: OT General Charges $OT Visit: 1 Procedure OT Treatments $Therapeutic Activity: 23-37 mins  Roney Jaffe Ervey Fallin 05/21/2013, 11:32 AM   Maurie Boettcher, OTR/L  [phone] 05/21/2013

## 2013-05-21 NOTE — Progress Notes (Signed)
[name] [name]   DOB:Sep 18, 1955   [id number]   RCV#:[id number]  Subjective: diplopia persists, OT has helped evaluate; he tells me he has had this before, "a long time ago, and it just went away." Daily BM. Foley uncomfortable and wants it out. Legs are swelling. Has bad reflux and doesn't want to eat, which concerns family. Wife and daughter in room  Objective: middle aged White man examined sitting at bedside Filed Vitals:   05/21/13 0613  BP: 106/59  Pulse: 58  Temp: 97.6 F (36.4 C)  Resp: 18    Body mass index is 31.91 kg/(m^2).  Intake/Output Summary (Last 24 hours) at 05/21/13 1248 Last data filed at 05/21/13 1009  Gross per 24 hour  Intake   9097 ml  Output   1200 ml  Net   7897 ml     Sclerae unicteric, right eye slack on right lateral gaze  Oropharynx clear--no thrush or other lesions  No cervical, supraclavicular or axillary adenopathy  Lungs no crackles or wheezes, dullness left base  Heart regular rate and rhythm  Abdomen soft, obese, NT, +BS  Neuro nonfocal, well-oriented, pleasant    CBG (last 3)   Recent Labs  05/20/13 2100 05/21/13 0757 05/21/13 1207  GLUCAP 183* 84 119*     Labs:  Lab Results  Component Value Date   WBC 1.3* 05/21/2013   HGB 8.4* 05/21/2013   HCT 24.5* 05/21/2013   MCV 98.8 05/21/2013   PLT 13* 05/21/2013   NEUTROABS 0.8* 05/21/2013    @LASTCHEMISTRY @  Urine Studies No results found for this basename: UACOL, UAPR, USPG, UPH, UTP, UGL, UKET, UBIL, UHGB, UNIT, UROB, ULEU, UEPI, UWBC, URBC, UBAC, CAST, CRYS, UCOM, BILUA,  in the last 72 hours  Basic Metabolic Panel:  Recent Labs Lab 05/17/13 0415 05/18/13 0545 05/19/13 0500 05/20/13 1020 05/21/13 0600  NA 132* 135* 134* 136* 137  K 3.8 4.2 4.0 3.9 4.0  CL 94* 102 103 105 106  CO2 26 23 22 22 25   GLUCOSE 103* 168* 151* 136* 93  BUN 9 18 21 16 12   CREATININE 0.79 0.61 0.59 0.59 0.53  CALCIUM 8.2* 7.1* 6.8* 6.7* 6.7*  PHOS 3.3 3.7 2.8 2.1* 2.1*   GFR Estimated  Creatinine Clearance: 99.9 ml/min (by C-G formula based on Cr of 0.53). Liver Function Tests:  Recent Labs Lab 05/17/13 0415 05/18/13 0545 05/19/13 0500 05/20/13 1020 05/21/13 0600  AST 103* 127* 79* 60* 37  ALT 24 20 18 19 16   ALKPHOS 323* 247* 241* 238* 193*  BILITOT 0.8 0.4 0.3 0.3 0.4  PROT 5.0* 4.4* 4.2* 4.1* 3.8*  ALBUMIN 2.0* 1.8* 1.8* 1.8* 1.7*   No results found for this basename: LIPASE, AMYLASE,  in the last 168 hours  Recent Labs Lab 05/15/13 1125  AMMONIA 35   Coagulation profile  Recent Labs Lab 05/15/13 0325  INR 1.26    CBC:  Recent Labs Lab 05/17/13 0415 05/18/13 0545 05/19/13 0500 05/20/13 1020 05/21/13 0600  WBC 6.0 2.1* 2.1* 1.9* 1.3*  NEUTROABS 2.7 1.2* 1.5* 1.5* 0.8*  HGB 11.9* 9.8* 9.5* 10.0* 8.4*  HCT 34.7* 29.2* 27.9* 30.2* 24.5*  MCV 97.5 98.0 97.9 99.3 98.8  PLT 26* 19* 16* 18* 13*   Cardiac Enzymes: No results found for this basename: CKTOTAL, CKMB, CKMBINDEX, TROPONINI,  in the last 168 hours BNP: No components found with this basename: POCBNP,  CBG:  Recent Labs Lab 05/20/13 1135 05/20/13 1706 05/20/13 2100 05/21/13 0757 05/21/13 1207  GLUCAP 126* 155*  183* 84 119*   D-Dimer No results found for this basename: DDIMER,  in the last 72 hours Hgb A1c No results found for this basename: HGBA1C,  in the last 72 hours Lipid Profile No results found for this basename: CHOL, HDL, LDLCALC, TRIG, CHOLHDL, LDLDIRECT,  in the last 72 hours Thyroid function studies No results found for this basename: TSH, T4TOTAL, FREET3, T3FREE, THYROIDAB,  in the last 72 hours Anemia work up No results found for this basename: VITAMINB12, FOLATE, FERRITIN, TIBC, IRON, RETICCTPCT,  in the last 72 hours Microbiology Recent Results (from the past 240 hour(s))  CULTURE, BLOOD (ROUTINE X 2)     Status: None   Collection Time    05/12/13  8:40 PM      Result Value Ref Range Status   Specimen Description BLOOD RIGHT ARM   Final   Special  Requests BOTTLES DRAWN AEROBIC ONLY 2CC   Final   Culture  Setup Time     Final   Value: 05/13/2013 02:22     Performed at [facility]   Culture     Final   Value: NO GROWTH 5 DAYS     Performed at [facility]   Report Status 05/19/2013 FINAL   Final  CULTURE, BLOOD (ROUTINE X 2)     Status: None   Collection Time    05/12/13  8:56 PM      Result Value Ref Range Status   Specimen Description BLOOD RIGHT HAND   Final   Special Requests BOTTLES DRAWN AEROBIC ONLY [place]   Final   Culture  Setup Time     Final   Value: 05/13/2013 02:22     Performed at [facility]     Final   Value: NO GROWTH 5 DAYS     Performed at [facility]   Report Status 05/19/2013 FINAL   Final   Results for [name] [name] (MRN [id number]) as of 05/21/2013 12:50  Ref. Range 05/17/2013 04:15 05/18/2013 05:45 05/19/2013 05:00 05/20/2013 10:20 05/21/2013 06:00  LDH Latest Range: 94-250 U/L 2790 (H) 3320 (H) 2326 (H) 1587 (H) 1152 (H)  Results for [name] [name] (MRN [id number]) as of 05/21/2013 12:50  Ref. Range 05/17/2013 04:15 05/18/2013 05:45 05/19/2013 05:00 05/20/2013 10:20 05/21/2013 06:00  Uric Acid, Serum Latest Range: 4.0-7.8 mg/dL 0.2 (L) 2.9 (L) 0.8 (L) 0.2 (L) 0.2 (L)   Results for [name] [name] (MRN [id number]) as of 05/21/2013 12:50  Ref. Range 05/17/2013 04:15 05/18/2013 05:45 05/19/2013 05:00 05/20/2013 10:20 05/21/2013 06:00  Phosphorus Latest Range: 2.3-4.6 mg/dL 3.3 3.7 2.8 2.1 (L) 2.1 (L)   Studies:  No results found.  Assessment: 58 y.o. [place] man admitted with multiple electrolyte abnormalities, hypercalcemia, pancytopenia, elevated LDH, with biopsy of R inguinal adenapoathy showing diffuse large-cell B-cell non-Hodgkin's lymphoma, with bone marrow and mesenteric involvement  (1) started CHOP-R 05/17/2013-- tolerated well; started "P" 04/13, follow CBGs  (2) at high risk for tumor lysis--at this point will start to slacken up on IVF, change to  allopurinol   (3) cirrhosis on scans but normal LFT's  (4) hypercalcemia of malignancy: s/p pamidronate 05/12/2013, calcium still low  (5) thrombocytopenia: no evidence of bleeding--likely transfusion tomorrow  (6) deconditioning: appreciate PT and OT;s help  (7) disposition: full code; home late this week  (8) hyponatremia: off demeclocycline with good numbers  (9) diplopia on R gaze-- decreased ROM on right; brain MRI 05/13/2013 showed unremarkable orbits--will do LP once platelets recover,   (  10) anemia: may need transfusion tomorrow depending on labs  (11) neutropenia: will start filgastrim  (12) reflux--address     Plan: we are about to pull the Foley; if he has difficulty voiding we can do in-and-outs. C/O reflux, will start metoclopramide. Have encopuraged him to ambulate with a walker and assistance for now. Hope for d/c later this week  Chauncey Cruel, MD 05/21/2013  12:48 PM

## 2013-05-22 ENCOUNTER — Other Ambulatory Visit: Payer: Self-pay | Admitting: Oncology

## 2013-05-22 DIAGNOSIS — C8590 Non-Hodgkin lymphoma, unspecified, unspecified site: Secondary | ICD-10-CM

## 2013-05-22 DIAGNOSIS — E46 Unspecified protein-calorie malnutrition: Secondary | ICD-10-CM

## 2013-05-22 DIAGNOSIS — H532 Diplopia: Secondary | ICD-10-CM

## 2013-05-22 LAB — CBC WITH DIFFERENTIAL/PLATELET
Basophils Absolute: 0 10*3/uL (ref 0.0–0.1)
Basophils Relative: 0 % (ref 0–1)
EOS ABS: 0 10*3/uL (ref 0.0–0.7)
Eosinophils Relative: 0 % (ref 0–5)
HCT: 26 % — ABNORMAL LOW (ref 39.0–52.0)
Hemoglobin: 8.9 g/dL — ABNORMAL LOW (ref 13.0–17.0)
LYMPHS ABS: 0.4 10*3/uL — AB (ref 0.7–4.0)
LYMPHS PCT: 38 % (ref 12–46)
MCH: 33.2 pg (ref 26.0–34.0)
MCHC: 34.2 g/dL (ref 30.0–36.0)
MCV: 97 fL (ref 78.0–100.0)
MONOS PCT: 1 % — AB (ref 3–12)
Monocytes Absolute: 0 10*3/uL — ABNORMAL LOW (ref 0.1–1.0)
Neutro Abs: 0.7 10*3/uL — ABNORMAL LOW (ref 1.7–7.7)
Neutrophils Relative %: 61 % (ref 43–77)
PLATELETS: 16 10*3/uL — AB (ref 150–400)
RBC: 2.68 MIL/uL — ABNORMAL LOW (ref 4.22–5.81)
RDW: 13.5 % (ref 11.5–15.5)
WBC: 1.1 10*3/uL — CL (ref 4.0–10.5)

## 2013-05-22 LAB — COMPREHENSIVE METABOLIC PANEL
ALBUMIN: 1.9 g/dL — AB (ref 3.5–5.2)
ALT: 19 U/L (ref 0–53)
AST: 35 U/L (ref 0–37)
Alkaline Phosphatase: 214 U/L — ABNORMAL HIGH (ref 39–117)
BILIRUBIN TOTAL: 0.4 mg/dL (ref 0.3–1.2)
BUN: 10 mg/dL (ref 6–23)
CHLORIDE: 106 meq/L (ref 96–112)
CO2: 25 mEq/L (ref 19–32)
CREATININE: 0.51 mg/dL (ref 0.50–1.35)
Calcium: 6.9 mg/dL — ABNORMAL LOW (ref 8.4–10.5)
GFR calc Af Amer: >90 mL/min (ref >90)
GFR calc non Af Amer: >90 mL/min (ref >90)
Glucose, Bld: 88 mg/dL (ref 70–99)
POTASSIUM: 4.1 meq/L (ref 3.7–5.3)
Sodium: 138 mEq/L (ref 137–147)
Total Protein: 4.1 g/dL — ABNORMAL LOW (ref 6.0–8.3)

## 2013-05-22 LAB — GLUCOSE, CAPILLARY
Glucose-Capillary: 105 mg/dL — ABNORMAL HIGH (ref 70–99)
Glucose-Capillary: 121 mg/dL — ABNORMAL HIGH (ref 70–99)

## 2013-05-22 LAB — PHOSPHORUS: Phosphorus: 2.1 mg/dL — ABNORMAL LOW (ref 2.3–4.6)

## 2013-05-22 LAB — URIC ACID: Uric Acid, Serum: 0.2 mg/dL — ABNORMAL LOW (ref 4.0–7.8)

## 2013-05-22 LAB — LACTATE DEHYDROGENASE: LDH: 1058 U/L — ABNORMAL HIGH (ref 94–250)

## 2013-05-22 MED ORDER — POLYETHYLENE GLYCOL 3350 17 G PO PACK: 17.0000 g | PACK | Freq: Every day | ORAL | Status: DC

## 2013-05-22 MED ORDER — METOCLOPRAMIDE HCL 5 MG PO TABS: 5.0000 mg | ORAL_TABLET | Freq: Three times a day (TID) | ORAL | Status: AC

## 2013-05-22 MED ORDER — ALLOPURINOL 300 MG PO TABS: 300.0000 mg | ORAL_TABLET | Freq: Every day | ORAL | Status: AC

## 2013-05-22 MED ORDER — PREDNISONE 20 MG PO TABS: 60.0000 mg | ORAL_TABLET | Freq: Every day | ORAL | Status: DC

## 2013-05-22 MED ORDER — BISACODYL 5 MG PO TBEC: 5.0000 mg | DELAYED_RELEASE_TABLET | Freq: Every day | ORAL | Status: DC | PRN

## 2013-05-22 NOTE — Discharge Summary (Signed)
Physician Discharge Summary  Patient ID: [name] [name] MRN: [id number] DOB/AGE: [birth year] 58 y.o.  Admit date: 05/06/2013 Discharge date: 05/22/2013  Primary Care Physician:  [name], MD   Discharge Diagnoses:  Non hodgkin's lymphoma, severe pancytopenia, diplopia, deconditionng, poor venous access, hypercalcemia of malignancy, malnutrition, cirrhosis,  Present on Admission:  . Hyponatremia . Hyperkalemia . GERD . HYPERLIPIDEMIA . HYPERTENSION . GOUT . PEPTIC ULCER DISEASE . Elevated LFTs . Chest pain . Other dysphagia . Diabetic neuropathy  Discharge Medications:    Medication List    STOP taking these medications       spironolactone 50 MG tablet  Commonly known as:  ALDACTONE      TAKE these medications       allopurinol 300 MG tablet  Commonly known as:  ZYLOPRIM  Take 1 tablet (300 mg total) by mouth daily.     bisacodyl 5 MG EC tablet  Commonly known as:  DULCOLAX  Take 1 tablet (5 mg total) by mouth daily as needed for moderate constipation.     esomeprazole 40 MG packet  Commonly known as:  NEXIUM  Take 40 mg by mouth daily before breakfast.     lisinopril 40 MG tablet  Commonly known as:  PRINIVIL,ZESTRIL  TAKE 1 TABLET BY MOUTH DAILY. PLEASE RESCHEDULE YOUR 6 MONTH FOLLOW UP APPOINTMENT FOR ADDITIONAL REFILLS.     metoCLOPramide 5 MG tablet  Commonly known as:  REGLAN  Take 1 tablet (5 mg total) by mouth 3 (three) times daily before meals.     metoprolol succinate 100 MG 24 hr tablet  Commonly known as:  TOPROL-XL  Take 100 mg by mouth daily. Take with or immediately following a meal.     oxyCODONE-acetaminophen 5-325 MG per tablet  Commonly known as:  PERCOCET/ROXICET  Take 1-2 tablets by mouth every 3 (three) hours as needed for severe pain.     polyethylene glycol packet  Commonly known as:  MIRALAX / GLYCOLAX  Take 17 g by mouth daily.     predniSONE 20 MG tablet  Commonly known as:  DELTASONE  Take 3 tablets (60 mg  total) by mouth daily with breakfast.         Disposition and Follow-up: see Dr Jana Hakim 4/23 at 12:30 PM cancer center [phone])  Significant Diagnostic Studies:  Dg Chest 2 View  05/12/2013   CLINICAL DATA:  Chest pain.  EXAM: CHEST  2 VIEW  COMPARISON:  05/06/2013.  FINDINGS: The cardiac silhouette, mediastinal and hilar contours are within normal limits and stable. The lungs are clear. Slightly low lung volumes with vascular crowding and streaky atelectasis. A small pleural effusion is suspected.  IMPRESSION: Low lung volumes with vascular crowding atelectasis and probable small left pleural effusion.   Electronically Signed   By: Kalman Jewels M.D.   On: 05/12/2013 20:19   Dg Chest 2 View  05/06/2013   CLINICAL DATA:  Chest pain  EXAM: CHEST  2 VIEW  COMPARISON:  04/23/2013  FINDINGS: Artifact overlies the chest. Heart size is normal. Mediastinal shadows are normal. The lungs are clear. Ordinary degenerative changes affect the spine. No effusions. BB anterior to the sternum again noted.  IMPRESSION: No active cardiopulmonary disease.   Electronically Signed   By: Nelson Chimes M.D.   On: 05/06/2013 19:28   Dg Chest 2 View  04/23/2013   CLINICAL DATA:  Chest pain  EXAM: CHEST  2 VIEW  COMPARISON:  None  FINDINGS: The heart size and mediastinal contours  are within normal limits. Both lungs are clear. The visualized skeletal structures are unremarkable. 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  IMPRESSION: No active cardiopulmonary disease.   Electronically Signed   By: [name] M.D.   On: 04/23/2013 18:10   Ct Chest W Contrast  05/15/2013   CLINICAL DATA:  Staging of non-Hodgkin's lymphoma. No chest complaints. History of gastroesophageal reflux disease. Hypertension. Diabetes.  EXAM: CT CHEST WITH CONTRAST  TECHNIQUE: Multidetector CT imaging of the chest was performed during intravenous contrast administration.  CONTRAST:  8mL OMNIPAQUE IOHEXOL 300 MG/ML  SOLN   COMPARISON:  CT ANGIO CHEST W/CM &/OR WO/CM dated 04/23/2013; CT ENTERO ABD/PELVIS W/CM dated 05/10/2013; MR ABDOMEN WO/W CM dated 05/10/2013  FINDINGS: Lungs/Pleura: No pulmonary nodule or mass. Trace left pleural fluid which is new since 04/23/2013.  Heart/Mediastinum: No supraclavicular adenopathy. Bilateral gynecomastia, slightly greater on the left than right. Aortic and branch vessel atherosclerosis. Heart size upper normal, without pericardial effusion. Multivessel coronary artery atherosclerosis. No central pulmonary embolism, on this non-dedicated study. No mediastinal or hilar adenopathy.  Upper Abdomen: Mild cirrhosis. Liver lesions detailed on prior MRI are not readily apparent. Trace perihepatic fluid, new since 05/10/2013. Normal adrenal glands. Mildly prominent periportal nodes which are favored to be reactive and related to cirrhosis. Subtle low-density splenic lesions, better evaluated on prior MRI.  Bones/Musculoskeletal: Posttraumatic or developmental defect about the third and fourth anterior left ribs. No worrisome osseous lesion.  IMPRESSION: 1. No acute process or evidence of active lymphoma within the chest. 2. New trace left pleural fluid. 3. Bilateral gynecomastia. 4. Age advanced coronary artery atherosclerosis. Recommend assessment of coronary risk factors and consideration of medical therapy. 5. Mild cirrhosis with new perihepatic trace fluid since 05/10/2013.   Electronically Signed   By: Abigail Miyamoto M.D.   On: 05/15/2013 15:23   Ct Angio Chest Pe W/cm &/or Wo Cm  04/23/2013   CLINICAL DATA:  Elevated D-dimer.  Chest pain.  EXAM: CT ANGIOGRAPHY CHEST WITH CONTRAST  TECHNIQUE: Multidetector CT imaging of the chest was performed using the standard protocol during bolus administration of intravenous contrast. Multiplanar CT image reconstructions and MIPs were obtained to evaluate the vascular anatomy.  CONTRAST:  135mL OMNIPAQUE IOHEXOL 350 MG/ML SOLN  COMPARISON:  DG CHEST 2 VIEW dated  04/23/2013  FINDINGS: Slight motion artifact in the mediastinum. Thoracic aorta normal in caliber. Atherosclerotic vascular changes are noted of the thoracic aorta. No aneurysm or dissection. Great vessels patent. Pulmonary arteries are intact. No pulmonary embolus. Cardiomegaly. Coronary artery disease. No pericardial effusion.  No significant mediastinal or hilar adenopathy. Thoracic esophagus unremarkable.  Large airways are patent. Lungs are clear of acute infiltrates. No pleural effusion or pneumothorax.  Visualized upper abdominal structures are unremarkable.  Thyroid is unremarkable. No supraclavicular or axillary lymph nodes are noted. Chest wall is intact. Degenerative changes thoracic spine. No acute abnormality.  Review of the MIP images confirms the above findings.  IMPRESSION: 1. Coronary artery disease.  Cardiomegaly. 2. No evidence of pulmonary emboli.  No acute abnormality.   Electronically Signed   By: Marcello Moores  Register   On: 04/23/2013 22:51   Mr Brain Wo Contrast  05/13/2013   CLINICAL DATA:  Intermittent confusion and lethargy.  EXAM: MRI HEAD WITHOUT CONTRAST  TECHNIQUE: Multiplanar, multiecho pulse sequences of the brain and surrounding structures were obtained without intravenous contrast.  COMPARISON:  None.  FINDINGS: There is no acute infarct or intracranial hemorrhage. Multiple scattered, small foci of  T2 hyperintensity are present in the subcortical and deep cerebral white matter bilaterally, nonspecific but compatible with mild chronic small vessel ischemic disease. There is mild cerebral atrophy. There is no evidence of mass, midline shift, or extra-axial fluid collection.  Orbits are unremarkable. Mild right maxillary sinus mucosal thickening is present. Mastoid air cells are clear. Major intracranial vascular flow voids are preserved.  IMPRESSION: 1. No evidence of acute intracranial abnormality or mass. 2. Mild chronic small vessel ischemic disease.   Electronically Signed   By:  Logan Bores   On: 05/13/2013 13:52   Mr Abdomen W Wo Contrast  05/10/2013   CLINICAL DATA:  Elevated liver function studies. Hypoechoic liver lesion on ultrasound.  EXAM: MRI ABDOMEN WITHOUT AND WITH CONTRAST  TECHNIQUE: Multiplanar multisequence MR imaging of the abdomen was performed both before and after the administration of intravenous contrast.  CONTRAST:  2mL MULTIHANCE GADOBENATE DIMEGLUMINE 529 MG/ML IV SOLN  COMPARISON:  US ABDOMEN COMPLETE dated 05/08/2013; CT ANGIO CHEST W/CM &/OR WO/CM dated 04/23/2013  FINDINGS: Examination is mildly motion limited, especially on the dynamic post-contrast images. The liver contours are mildly irregular, and there is asymmetric enlargement of the caudate and medial segment of the left hepatic lobes. In addition, there is moderate bilateral gynecomastia. These findings suggest underlying cirrhosis. Best seen on the axial T2 FSE images are multiple less than 1 cm liver lesions with T2 hyperintensity. The largest measures 9 mm in the dome of the right lobe on image 12. The liver appears fairly homogeneous on T1 weighted images. Specifically, no T1 hyperintense lesion or significant steatosis is identified. Post-contrast, there are several small foci of nodular enhancement in the liver, suboptimally evaluated due to motion. In the portal phase, the hepatic parenchyma appears fairly homogeneous.  Several mildly prominent lymph nodes are present within the porta hepatis and portacaval space. On series 3, these measure 10 mm on image 27 and 12 mm on image 31. There is no retroperitoneal lymphadenopathy, ascites or peritoneal nodularity. The portal, superior mesenteric, splenic and hepatic veins are patent. No significant varices are identified. On the coronal images, there is questionable retraction and abnormal enhancement of the mesentery in the right lower quadrant. This area is not included on the axial images. There appear to be prominent right iliac nodes as well on the  coronal images.  The gallbladder, biliary system and pancreas appear normal. The spleen, adrenal glands and kidneys appear normal. There is no hydronephrosis. There is a probable hemangioma within the L3 vertebral body.  IMPRESSION: 1. There are multiple small T2 hyperintense liver lesions, some of which demonstrate apparent arterial phase enhancement. Dynamic images are limited by breathing artifact. In the setting of apparent underlying cirrhosis, these findings are consistent with regenerating and dysplastic nodules. 2. Questionable retraction and abnormal enhancement within the ileocolonic mesentery. Carcinoid tumor is a consideration and could explain the liver lesions. For initial evaluation, contrast-enhanced abdominal pelvic CT (or CT enterography) is recommended. 3. If CT does not show mesenteric lesion, MR follow-up of the liver lesions in 3-6 months would be recommended. Correlation with alpha-fetoprotein levels also recommended. 4. Nonspecific adenopathy in the porta hepatis, likely related to cirrhosis.   Electronically Signed   By: Camie Patience M.D.   On: 05/10/2013 08:59   US Abdomen Complete  05/08/2013   CLINICAL DATA:  Elevated LFTs  EXAM: ULTRASOUND ABDOMEN COMPLETE  COMPARISON:  None.  FINDINGS: Gallbladder:  No gallstones or wall thickening visualized. No sonographic Murphy sign noted.  Common  bile duct:  Diameter: 3.4 mm  Liver:  Mildly increased in echogenicity likely related to fatty infiltration. There is an 8 mm relatively hypoechoic lesion within the right lobe of the liver. Some slight increased echogenicity is noted within. This is of uncertain etiology.  IVC:  No abnormality visualized.  Pancreas:  Visualized portion unremarkable.  Spleen:  Size and appearance within normal limits.  Right Kidney:  Length: 12.1 cm. Echogenicity within normal limits. No mass or hydronephrosis visualized.  Left Kidney:  Length: 12.2 cm. Echogenicity within normal limits. No mass or hydronephrosis  visualized.  Abdominal aorta:  No aneurysm visualized.  Other findings:  None.  IMPRESSION: Hypoechoic lesion within the liver as described. This is incompletely evaluated on this exam. Short-term followup may be helpful to assess for stability. Alternatively nonemergent MRI of the liver could be performed.   Electronically Signed   By: Alcide Clever M.D.   On: 05/08/2013 11:51   Nm Myocar Multi W/spect W/wall Motion / Ef  04/25/2013   CLINICAL DATA:  Chest pain  EXAM: MYOCARDIAL IMAGING WITH SPECT (REST AND PHARMACOLOGIC-STRESS)  GATED LEFT VENTRICULAR WALL MOTION STUDY  LEFT VENTRICULAR EJECTION FRACTION  TECHNIQUE: Standard myocardial SPECT imaging was performed after resting intravenous injection of 10 mCi Tc-84m sestamibi. Subsequently, intravenous infusion of Lexiscan was performed under the supervision of the Cardiology staff. At peak effect of the drug, 30 mCi Tc-45m sestamibi was injected intravenously and standard myocardial SPECT imaging was performed. Quantitative gated imaging was also performed to evaluate left ventricular wall motion, and estimate left ventricular ejection fraction.  COMPARISON:  None.  FINDINGS: SPECT: Allowing for diaphragmatic attenuation artifact, there are no perfusion defects.  Wall motion:  Normal  Ejection fraction: 78%. End-diastolic volume 53 cc. End systolic volume 11 cc.  IMPRESSION: No perfusion defects allowing for diaphragmatic attenuation artifact.   Electronically Signed   By: Maryclare Bean M.D.   On: 04/25/2013 16:27   Nm Pet Image Initial (pi) Skull Base To Thigh  05/16/2013   CLINICAL DATA:  Initial treatment strategy for lymphoma.  EXAM: NUCLEAR MEDICINE PET SKULL BASE TO THIGH  TECHNIQUE: 10.0 mCi F-18 FDG was injected intravenously. Full-ring PET imaging was performed from the skull base to thigh after the radiotracer. CT data was obtained and used for attenuation correction and anatomic localization.  FASTING BLOOD GLUCOSE:  Value: 88 mg/dl  COMPARISON:  CT  scan 05/10/2013  FINDINGS: NECK  No hypermetabolic lymph nodes in the neck.  CHEST  No hypermetabolic mediastinal or hilar nodes. No suspicious pulmonary nodules on the CT scan.  ABDOMEN/PELVIS  Interval development of diffuse mesenteric edema with free abdominal and pelvic fluid. The small bowel appears thickened. There are scattered mesenteric and retroperitoneal lymph nodes. There is diffuse and fairly marked FDG uptake in the small bowel, mesenteric and omentum. This could reflect diffuse lymphoma but I think it is much more likely severe enteritis or possibly drug reaction from chemotherapy. The large nodal mass in the right pelvis is markedly hypermetabolic with SUV max of 16.6.  There may be small metabolically active liver lesions but this is equivocal. I cannot really correlate these areas with the abnormal MRI.  SKELETON  Diffuse abnormal nodular increased uptake in the osseous structures concerning for osseous lymphoma.  IMPRESSION: 1. Large right pelvic nodal mass is FDG positive and consistent with known lymphoma. 2. Diffuse uptake in the mesentery and small bowel could be due to diffuse lymphoma involvement but is more likely a severe enteritis or  drug reaction. 3. Diffuse abnormal FDG uptake in bones is suspicious diffuse lymphomatous involvement.   Electronically Signed   By: Kalman Jewels M.D.   On: 05/16/2013 14:27   US Biopsy  05/13/2013   INDICATION: Unknown primary, now with right external iliac chain lymph node worrisome for lymphoma. Please perform biopsy for tissue diagnostic purposes  EXAM: ULTRASOUND GUIDED BIOPSY OF ENLARGED RIGHT EXTERNAL ILIAC CHAIN LYMPH NODE  COMPARISON:  CT ENTERO ABD/PELVIS W/CM dated 05/10/2013; CT ANGIO CHEST W/CM &/OR WO/CM dated 04/23/2013  MEDICATIONS: Fentanyl 50 mcg IV; Versed 1 mg IV  ANESTHESIA/SEDATION: Total Moderate Sedation time  15 minutes  COMPLICATIONS: None immediate  TECHNIQUE: Informed written consent was obtained from the patient after a  discussion of the risks, benefits and alternatives to treatment. Questions regarding the procedure were encouraged and answered. Initial ultrasound scanning demonstrated an enlarged ill-defined mixed echogenic mass within the cranial aspect of the right groin compatible with the known heterogeneously enhancing right external iliac chain lymph node/mass. An ultrasound image was saved for documentation purposes. The procedure was planned. A timeout was performed prior to the initiation of the procedure.  The operative was prepped and draped in the usual sterile fashion, and a sterile drape was applied covering the operative field. A timeout was performed prior to the initiation of the procedure. Local anesthesia was provided with 1% lidocaine with epinephrine.  Under direct ultrasound guidance, 2 separate 18 gauge core needle devices (Achieve and Biopince) were utilized to obtain to obtain a total of 7 core needle biopsies of the ill-defined right external iliac chain lymph node/mass.  The samples were placed in saline and submitted to pathology. The needle was removed and hemostasis was achieved with manual compression. Post procedure scan was negative for significant hematoma. A dressing was placed. The patient tolerated the procedure well without immediate postprocedural complication.  IMPRESSION: Technically successful ultrasound guided core needle biopsy of ill-defined right external iliac chain lymph node/mass.   Electronically Signed   By: Sandi Mariscal M.D.   On: 05/13/2013 11:32   Ct Biopsy  05/15/2013   CLINICAL DATA:  Thrombocytopenia  EXAM: CT GUIDED DEEP ILIAC BONE ASPIRATION AND CORE BIOPSY  TECHNIQUE: The procedure, risks (including but not limited to bleeding, infection, organ damage ), benefits, and alternatives were explained to the patient. Questions regarding the procedure were encouraged and answered. The patient understands and consents to the procedure. Patient was placed supine on the CT gantry  and limited axial scans through the pelvis were obtained. Appropriate skin entry site was identified. Skin site was marked, prepped with Betadine, draped in usual sterile fashion, and infiltrated locally with 1% lidocaine.  Intravenous Fentanyl and Versed were administered as conscious sedation during continuous cardiorespiratory monitoring by the radiology RN, with a total moderate sedation time of less than 30 minutes.  Under CT fluoroscopic guidance an 11-gauge Cook trocar bone needle was advanced into the right iliac bone just lateral to the sacroiliac joint. Once needle tip position was confirmed, coaxial aspiration samples were attempted but not obtained. The needles reposition the second time but again no aspirate could be obtained. Two separate long core specimens were obtained using the needle itself, which was then removed. Post procedure scans show no hematoma or fracture. Patient tolerated procedure well, with no immediate complication.  IMPRESSION: 1. Technically successful CT guided right iliac bone core and aspiration biopsy.   Electronically Signed   By: Arne Cleveland M.D.   On: 05/15/2013 14:19   Ct Entero  Abd/pelvis W/cm  05/11/2013   CLINICAL DATA:  Elevated liver function studies with multiple liver lesions and possible right ileocolonic mesenteric mass on MRI.  EXAM: CT ABDOMEN AND PELVIS WITH CONTRAST (ENTEROGRAPHY)  TECHNIQUE: Multidetector CT of the abdomen and pelvis during bolus administration of intravenous contrast. Negative oral contrast VoLumen was given.  CONTRAST:  172mL OMNIPAQUE IOHEXOL 300 MG/ML  SOLN  COMPARISON:  MR ABDOMEN WO/W CM dated 05/10/2013; US ABDOMEN COMPLETE dated 05/08/2013  FINDINGS: Patchy dependent opacity at the left lung base likely represents atelectasis. The right lung base is clear. There is no significant pleural or pericardial effusion. Bilateral gynecomastia noted.  The liver lesions demonstrated by MRI are subtle on CT and not further characterized.  Hepatic contours remain mildly irregular, consistent with underlying cirrhosis. The spleen is normal in size. There is a 8 mm low-density lesion centrally, likely an incidental hemangioma. The gallbladder, biliary system and pancreas appear normal.  There is no adrenal mass. Both kidneys appear normal. There is no hydronephrosis.  The stomach appears unremarkable aside from apparent peristalsis in the pre pyloric region, not clearly demonstrated on MRI. No focal mucosal abnormalities are identified within the small bowel or colon. The colon is stool-filled. There is edema throughout the mesenteric fat. No focal nodularity, calcifications or masses seen within the ileocolonic mesentery as suggested on MRI. The appendix appears normal. However, there are several enlarged lymph nodes along the right iliac vessels, including a dominant nodal mass superiorly in the right groin, measuring up to 3.8 x 4.9 cm on image 70. Prominent lymph nodes within the porta hepatis are again noted, likely related to underlying liver disease.  There is mild aortoiliac atherosclerosis. The urinary bladder appears normal. The prostate gland is not significantly enlarged. There is no perirectal adenopathy.  There are no worrisome osseous findings.  IMPRESSION: 1. Dominant nodal mass in the right external iliac chain with additional smaller lymph nodes along the right iliac vessels. No primary small bowel, colonic or mesenteric lesion is identified to suggest underlying carcinoid tumor or adenocarcinoma. These findings are presumably unrelated the liver disease and worrisome for possible lymphoma. Biopsy of the right iliac nodal mass recommended. 2. No evidence of bowel obstruction or extraluminal fluid collection. Mild edema throughout the mesenteric fat is nonspecific although likely related to the patient's liver disease. 3. The liver lesions seen on MRI are not further characterized. Please refer to separate report for follow up  recommendations.   Electronically Signed   By: Camie Patience M.D.   On: 05/11/2013 07:46    Discharge Laboratory Values: Basic Metabolic Panel:  Recent Labs Lab 05/18/13 0545 05/19/13 0500 05/20/13 1020 05/21/13 0600 05/22/13 0530  NA 135* 134* 136* 137 138  K 4.2 4.0 3.9 4.0 4.1  CL 102 103 105 106 106  CO2 23 22 22 25 25   GLUCOSE 168* 151* 136* 93 88  BUN 18 21 16 12 10   CREATININE 0.61 0.59 0.59 0.53 0.51  CALCIUM 7.1* 6.8* 6.7* 6.7* 6.9*  PHOS 3.7 2.8 2.1* 2.1* 2.1*   GFR Estimated Creatinine Clearance: 99.9 ml/min (by C-G formula based on Cr of 0.51). Liver Function Tests:  Recent Labs Lab 05/18/13 0545 05/19/13 0500 05/20/13 1020 05/21/13 0600 05/22/13 0530  AST 127* 79* 60* 37 35  ALT 20 18 19 16 19   ALKPHOS 247* 241* 238* 193* 214*  BILITOT 0.4 0.3 0.3 0.4 0.4  PROT 4.4* 4.2* 4.1* 3.8* 4.1*  ALBUMIN 1.8* 1.8* 1.8* 1.7* 1.9*   No  results found for this basename: LIPASE, AMYLASE,  in the last 168 hours No results found for this basename: AMMONIA,  in the last 168 hours Coagulation profile No results found for this basename: INR, PROTIME,  in the last 168 hours  CBC:  Recent Labs Lab 05/18/13 0545 05/19/13 0500 05/20/13 1020 05/21/13 0600 05/22/13 0530  WBC 2.1* 2.1* 1.9* 1.3* 1.1*  NEUTROABS 1.2* 1.5* 1.5* 0.8* 0.7*  HGB 9.8* 9.5* 10.0* 8.4* 8.9*  HCT 29.2* 27.9* 30.2* 24.5* 26.0*  MCV 98.0 97.9 99.3 98.8 97.0  PLT 19* 16* 18* 13* 16*   Cardiac Enzymes: No results found for this basename: CKTOTAL, CKMB, CKMBINDEX, TROPONINI,  in the last 168 hours BNP: No components found with this basename: POCBNP,  CBG:  Recent Labs Lab 05/21/13 1207 05/21/13 1713 05/21/13 2112 05/22/13 0754 05/22/13 1133  GLUCAP 119* 211* 196* 105* 121*   D-Dimer No results found for this basename: DDIMER,  in the last 72 hours Hgb A1c No results found for this basename: HGBA1C,  in the last 72 hours Lipid Profile No results found for this basename: CHOL, HDL,  LDLCALC, TRIG, CHOLHDL, LDLDIRECT,  in the last 72 hours Thyroid function studies No results found for this basename: TSH, T4TOTAL, FREET3, T3FREE, THYROIDAB,  in the last 72 hours Anemia work up No results found for this basename: VITAMINB12, FOLATE, FERRITIN, TIBC, IRON, RETICCTPCT,  in the last 72 hours Sepsis Labs No components found with this basename: PROCALCITONIN,  WBC,  LACTICIDVEN,  Microbiology Recent Results (from the past 240 hour(s))  CULTURE, BLOOD (ROUTINE X 2)     Status: None   Collection Time    05/12/13  8:40 PM      Result Value Ref Range Status   Specimen Description BLOOD RIGHT ARM   Final   Special Requests BOTTLES DRAWN AEROBIC ONLY 2CC   Final   Culture  Setup Time     Final   Value: 05/13/2013 02:22     Performed at [facility]     Final   Value: NO GROWTH 5 DAYS     Performed at [facility]   Report Status 05/19/2013 FINAL   Final  CULTURE, BLOOD (ROUTINE X 2)     Status: None   Collection Time    05/12/13  8:56 PM      Result Value Ref Range Status   Specimen Description BLOOD RIGHT HAND   Final   Special Requests BOTTLES DRAWN AEROBIC ONLY 2CC   Final   Culture  Setup Time     Final   Value: 05/13/2013 02:22     Performed at [facility]   Culture     Final   Value: NO GROWTH 5 DAYS     Performed at [facility]   Report Status 05/19/2013 FINAL   Final     Brief H and P: For complete details please refer to admission H and P, but in brief, patient was admitted with multiple electrolyte abnormalities and found to have non-hodgkin's lympohoma; see hospital course for more details  Physical Exam at Discharge: BP 120/70  Pulse 62  Temp(Src) 98 F (36.7 C) (Oral)  Resp 18  Ht 5\' 4"  (1.626 m)  Wt 186 lb (84.369 kg)  BMI 31.91 kg/m2  SpO2 99% Gen: middle aged man examined in bed Cardiovascular: RRR, no murmur Respiratory: auscultated anterolaterally, no rales or rhomchi Gastrointestinal:soft, NT,  +BS Extremities:mild LE edema, improved  Hospital Course:   Admitted with fever, sweats, severe hypercalcemia and hypokalemia, pancytopenia, and 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 Started CHOP-R 05/17/2013, with good tolerance. Remains pancytopenic but clinically stable at time of discharge and isntructed on calling for fever, bleeding, or other acute change.  Principal Problem:   Hyponatremia Active Problems:   Type II or unspecified type diabetes mellitus with neurological manifestations, not stated as uncontrolled(250.60)   Thrombocytopenia   Hypercalcemia   Orthostasis   HYPERLIPIDEMIA   GOUT   HYPERTENSION   GERD   PEPTIC ULCER DISEASE   Diabetic neuropathy   Elevated LFTs   Other dysphagia   Chest pain   Hyperkalemia 58 y.o.  [place] man admitted with multiple electrolyte abnormalities, hypercalcemia, pancytopenia, elevated LDH, with biopsy of R inguinal adenapoathy showing diffuse large-cell B-cell non-Hodgkin's lymphoma, with bone marrow and mesenteric involvement  (1) started CHOP-R 05/17/2013-- tolerated well; started "P" 04/13, follow CBGs  (2) at high risk for tumor lysis--at this point will start to slacken up on IVF, change to allopurinol  (3) cirrhosis on scans but normal LFT's  (4) hypercalcemia of malignancy: s/p pamidronate 05/12/2013, calcium still low  (5) thrombocytopenia: no evidence of bleeding--likely transfusion tomorrow  (6) deconditioning: appreciate PT and OT;s help  (7) disposition: full code; home late this week  (8) hyponatremia: off demeclocycline with good numbers  (9) diplopia on R gaze-- decreased ROM on right; brain MRI 05/13/2013 showed unremarkable orbits--will do LP once platelets recover,  (10) anemia: may need transfusion tomorrow depending on labs  (11) neutropenia: will start filgastrim  (12) reflux--address    Diet:  regular  Activity:  May not drive; use walker until otherwise  instructed  Condition at Discharge:   improved  Signed: Dr. Lurline Del [phone]  05/22/2013, 12:53 PM

## 2013-05-22 NOTE — Progress Notes (Signed)
Occupational Therapy Treatment Patient Details Name: Arvell Pulsifer MRN: [id number] DOB: [birth year] Today's Date: 05/22/2013    History of present illness Pt is a 58 year old male admitted on 05/06/13 with chest pains and now found to have non-Hodgkin's lymphoma, with bone marrow and mesenteric involvement; PMH of hypertension, peptic ulcer disease, gastroesophageal reflux disease, diet-controlled type 2 diabetes, hyperlipidemia    OT comments  Followed up with pt on spot occlusion for his diplopia. He reports being able to tolerate wearing the eyeglasses for about 2 hours before started to get a headache yesterday. Discussed continuing to try to wear glasses to tolerance for the benefit of reducing diplopia but also for encouraging his weaker eye to exercise and for having his depth perception (rather than closing one eye)   Follow Up Recommendations  Outpatient OT;Other (comment) (follow up with eye doctor also)    Equipment Recommendations  None recommended by OT    Recommendations for Other Services      Precautions / Restrictions Precautions Precautions: None Precaution Comments: diplopia Restrictions Weight Bearing Restrictions: No       Mobility Bed Mobility                  Transfers                      Balance                                   ADL                                         General ADL Comments: Pt planning to get up with famly to ambulate later. Pt reports that yesterday he started to get a headache after about 2 hours of wearing the glasses with spot occlusion. He states it did help while up ambulating with family however for the diplopia. Daughter present and later in session wife present. Had pt put glasses back on and look around and the spot occlusion still in correct place on lenses to  help with diplopia. Encouraged pt to continue to try to wear glasses as tolerated to help him exercise eye muscles  and still retain some depth perception rather than occluding whole lense or closing one eye. Pt verbalizes understanding. Discussed safety with having family member with him when he is up for safety due to diplopia but he does report he is doing better with ADL. He states he has been up to the bathroom and performed grooming with better tolerance. Encouraged him that being up to tolerance will help him build strength. Gave pt and family member brochure for outpatient OT and encouraged them to follow up for outpatient OT for vision and with the eye doctor also. Nursing aware.  Pt is aware to not drive while he has the diplopia until cleared by MD. Pt and family with no questions at end of session regarding eyeglasses. Continued to encourage him to wear the spot occlusion eyeglasses to tolerance gradually building up time wearing them if able.       Vision                     Quarry manager  Extremity/Trunk Assessment               Exercises     Shoulder Instructions       General Comments      Pertinent Vitals/ Pain       No complaint of  Home Living                                          Prior Functioning/Environment              Frequency Min 2X/week     Progress Toward Goals  OT Goals(current goals can now be found in the care plan section)  Progress towards OT goals: Progressing toward goals     Plan Discharge plan remains appropriate    Co-evaluation                 End of Session     Activity Tolerance Patient tolerated treatment well   Patient Left in bed;with call bell/phone within reach   Nurse Communication          Time: 0905-0920 OT Time Calculation (min): 15 min  Charges: OT General Charges $OT Visit: 1 Procedure OT Treatments $Therapeutic Activity: 8-22 mins  [place] [phone] 05/22/2013, 11:50 AM

## 2013-05-22 NOTE — Progress Notes (Signed)
Patient was stable at time of discharge. Reviewed discharge packet with patient and family. Clarified their questions. Patient and family verbalized understanding of discharge education. Instructed about PICC and medication regimen.

## 2013-05-22 NOTE — Progress Notes (Signed)
PT Cancellation Note/ Discharge from Acute PT  Patient Details Name: [name] [name] MRN: [id number] DOB: 1955-09-13   Cancelled Treatment:    Reason Eval/Treat Not Completed: Other (comment) Pt declines mobility at this time and states he will ambulate with spouse when she arrives.  Discussed d/c from PT and pt agreeable stating he will likely d/c home tomorrow.  PT to sign off as staff and spouse will be ambulating with pt according to his schedule.   [name] 05/22/2013, 9:53 AM Carmelia Bake, PT, DPT 05/22/2013 Pager: [phone]

## 2013-05-23 ENCOUNTER — Other Ambulatory Visit: Payer: Self-pay | Admitting: Oncology

## 2013-05-23 ENCOUNTER — Other Ambulatory Visit: Payer: Self-pay | Admitting: *Deleted

## 2013-05-23 ENCOUNTER — Telehealth: Payer: Self-pay | Admitting: *Deleted

## 2013-05-23 ENCOUNTER — Telehealth: Payer: Self-pay | Admitting: Oncology

## 2013-05-23 DIAGNOSIS — H532 Diplopia: Secondary | ICD-10-CM

## 2013-05-23 DIAGNOSIS — C8590 Non-Hodgkin lymphoma, unspecified, unspecified site: Secondary | ICD-10-CM

## 2013-05-23 DIAGNOSIS — C8583 Other specified types of non-Hodgkin lymphoma, intra-abdominal lymph nodes: Secondary | ICD-10-CM

## 2013-05-23 NOTE — Telephone Encounter (Signed)
Message left by Cyril Mourning at [facility] stating per D/C order for Home Health to PICC line care with lab draw Q Friday is not a covered service.  Return call number for Cyril Mourning given as [phone].  This RN called to pt's home phone to discuss above and need for care to be provided at this office as well as teaching for self care. Note per MD referrals also made to neurologist and opthalmology due to diplopia.  Obtained VM- message left to return call to this RN.

## 2013-05-23 NOTE — Telephone Encounter (Signed)
S/w naomi from dr Jana Half patel's office regarding the referral and she wants [place] to fax over the referral since their office is not on the epic system as of yet. Per naomi their office will make the appt and then fax over the appt d/t time back to [place] for someone to contact the pt. Pt could not be seen at dr mcuen's office since they are short of staff.

## 2013-05-24 ENCOUNTER — Ambulatory Visit (HOSPITAL_BASED_OUTPATIENT_CLINIC_OR_DEPARTMENT_OTHER): Payer: No Typology Code available for payment source

## 2013-05-24 ENCOUNTER — Other Ambulatory Visit: Payer: Self-pay | Admitting: *Deleted

## 2013-05-24 VITALS — BP 118/63 | HR 75 | Temp 97.2°F | Resp 16

## 2013-05-24 DIAGNOSIS — C8589 Other specified types of non-Hodgkin lymphoma, extranodal and solid organ sites: Secondary | ICD-10-CM

## 2013-05-24 DIAGNOSIS — Z4682 Encounter for fitting and adjustment of non-vascular catheter: Secondary | ICD-10-CM

## 2013-05-24 DIAGNOSIS — C8590 Non-Hodgkin lymphoma, unspecified, unspecified site: Secondary | ICD-10-CM

## 2013-05-24 LAB — CBC WITH DIFFERENTIAL/PLATELET
HCT: 27.4 % — ABNORMAL LOW (ref 38.4–49.9)
HEMOGLOBIN: 9.2 g/dL — AB (ref 13.0–17.1)
MCH: 32.1 pg (ref 27.2–33.4)
MCHC: 33.6 g/dL (ref 32.0–36.0)
MCV: 95.5 fL (ref 79.3–98.0)
PLATELETS: 11 10*3/uL — AB (ref 140–400)
RBC: 2.87 10*6/uL — ABNORMAL LOW (ref 4.20–5.82)
RDW: 13.3 % (ref 11.0–14.6)
WBC: 0.2 10*3/uL — CL (ref 4.0–10.3)

## 2013-05-24 LAB — MANUAL DIFFERENTIAL
ALC: 0.1 10*3/uL — AB (ref 0.9–3.3)
ANC (CHCC manual diff): 0.1 10*3/uL — CL (ref 1.5–6.5)
BAND NEUTROPHILS: 6 % (ref 0–10)
Basophil: 2 % (ref 0–2)
Blasts: 0 % (ref 0–0)
EOS: 4 % (ref 0–7)
LYMPH: 60 % — ABNORMAL HIGH (ref 14–49)
METAMYELOCYTES PCT: 0 % (ref 0–0)
MONO: 0 % (ref 0–14)
Myelocytes: 0 % (ref 0–0)
NRBC: 0 % (ref 0–0)
OTHER CELL: 0 % (ref 0–0)
PLT EST: DECREASED
PROMYELO: 0 % (ref 0–0)
SEG: 28 % — ABNORMAL LOW (ref 38–77)
VARIANT LYMPH: 0 % (ref 0–0)

## 2013-05-24 MED ORDER — SODIUM CHLORIDE 0.9 % IJ SOLN
10.0000 mL | Freq: Once | INTRAMUSCULAR | Status: AC
  Administered 2013-05-24: 10 mL via INTRAVENOUS
  Filled 2013-05-24: qty 10

## 2013-05-24 MED ORDER — HEPARIN SOD (PORK) LOCK FLUSH 100 UNIT/ML IV SOLN
250.0000 [IU] | Freq: Once | INTRAVENOUS | Status: AC
  Administered 2013-05-24: 250 [IU] via INTRAVENOUS
  Filled 2013-05-24: qty 5

## 2013-05-24 NOTE — Patient Instructions (Signed)
PICC Home Guide A peripherally inserted central catheter (PICC) is a long, thin, flexible tube that is inserted into a vein in the upper arm. It is a form of intravenous (IV) access. It is considered to be a "central" line because the tip of the PICC ends in a large vein in your chest. This large vein is called the superior vena cava (SVC). The PICC tip ends in the SVC because there is a lot of blood flow in the SVC. This allows medicines and IV fluids to be quickly distributed throughout the body. The PICC is inserted using a sterile technique by a specially trained nurse or physician. After the PICC is inserted, a chest X-ray exam is done to be sure it is in the correct place.  A PICC may be placed for different reasons, such as:  To give medicines and liquid nutrition that can only be given through a central line. Examples are:  Certain antibiotic treatments.  Chemotherapy.  Total parenteral nutrition (TPN).  To take frequent blood samples.  To give IV fluids and blood products.  If there is difficulty placing a peripheral intravenous (PIV) catheter. If taken care of properly, a PICC can remain in place for several months. A PICC can also allow a person to go home from the hospital early. Medicine and PICC care can be managed at home by a family member or home health care team. WHAT PROBLEMS CAN HAPPEN WHEN I HAVE A PICC? Problems with a PICC can occasionally occur. These may include:  A blood clot (thrombus) forming in or at the tip of the PICC. This can cause the PICC to become clogged. A clot-dissolving medicine called tissue plasminogen activator (tPA) can be given through the PICC to help break up the clot.  Inflammation of the vein (phlebitis) in which the PICC is placed. Signs of inflammation may include redness, pain at the insertion site, red streaks, or being able to feel a "cord" in the vein where the PICC is located.  Infection in the PICC or at the insertion site. Signs of  infection may include fever, chills, redness, swelling, or pus drainage from the PICC insertion site.  PICC movement (malposition). The PICC tip may move from its original position due to excessive physical activity, forceful coughing, sneezing, or vomiting.  A break or cut in the PICC. It is important to not use scissors near the PICC.  Nerve or tendon irritation or injury during PICC insertion. WHAT SHOULD I KEEP IN MIND ABOUT ACTIVITIES WHEN I HAVE A PICC?  You may bend your arm and move it freely. If your PICC is near or at the bend of your elbow, avoid activity with repeated motion at the elbow.  Rest at home for the remainder of the day following PICC line insertion.  Avoid lifting heavy objects as instructed by your health care provider.  Avoid using a crutch with the arm on the same side as your PICC. You may need to use a walker. WHAT SHOULD I KNOW ABOUT MY PICC DRESSING?  Keep your PICC bandage (dressing) clean and dry to prevent infection.  Ask your health care provider when you may shower. Ask your health care provider to teach you how to wrap the PICC when you do take a shower.  Change the PICC dressing as instructed by your health care provider.  Change your PICC dressing if it becomes loose or wet. WHAT SHOULD I KNOW ABOUT PICC CARE?  Check the PICC insertion site daily for   leakage, redness, swelling, or pain.  Do not take a bath, swim, or use hot tubs when you have a PICC. Cover PICC line with clear plastic wrap and tape to keep it dry while showering.  Flush the PICC as directed by your health care provider. Let your health care provider know right away if the PICC is difficult to flush or does not flush. Do not use force to flush the PICC.  Do not use a syringe that is less than 10 mL to flush the PICC.  Never pull or tug on the PICC.  Avoid blood pressure checks on the arm with the PICC.  Keep your PICC identification card with you at all times.  Do not  take the PICC out yourself. Only a trained clinical professional should remove the PICC. SEEK IMMEDIATE MEDICAL CARE IF:  Your PICC is accidently pulled all the way out. If this happens, cover the insertion site with a bandage or gauze dressing. Do not throw the PICC away. Your health care provider will need to inspect it.  Your PICC was tugged or pulled and has partially come out. Do not  push the PICC back in.  There is any type of drainage, redness, or swelling where the PICC enters the skin.  You cannot flush the PICC, it is difficult to flush, or the PICC leaks around the insertion site when it is flushed.  You hear a "flushing" sound when the PICC is flushed.  You have pain, discomfort, or numbness in your arm, shoulder, or jaw on the same side as the PICC.  You feel your heart "racing" or skipping beats.  You notice a hole or tear in the PICC.  You develop chills or a fever. MAKE SURE YOU:   Understand these instructions.  Will watch your condition.  Will get help right away if you are not doing well or get worse. Document Released: 07/31/2002 Document Revised: 11/14/2012 Document Reviewed: 10/01/2012 [employer] Patient Information 2014 [employer].  

## 2013-05-27 ENCOUNTER — Ambulatory Visit (HOSPITAL_BASED_OUTPATIENT_CLINIC_OR_DEPARTMENT_OTHER): Payer: No Typology Code available for payment source

## 2013-05-27 ENCOUNTER — Other Ambulatory Visit: Payer: Self-pay | Admitting: *Deleted

## 2013-05-27 ENCOUNTER — Other Ambulatory Visit: Payer: Self-pay

## 2013-05-27 ENCOUNTER — Other Ambulatory Visit (HOSPITAL_BASED_OUTPATIENT_CLINIC_OR_DEPARTMENT_OTHER): Payer: No Typology Code available for payment source | Admitting: *Deleted

## 2013-05-27 DIAGNOSIS — C8589 Other specified types of non-Hodgkin lymphoma, extranodal and solid organ sites: Secondary | ICD-10-CM

## 2013-05-27 DIAGNOSIS — C8590 Non-Hodgkin lymphoma, unspecified, unspecified site: Secondary | ICD-10-CM

## 2013-05-27 DIAGNOSIS — D702 Other drug-induced agranulocytosis: Secondary | ICD-10-CM

## 2013-05-27 DIAGNOSIS — Z452 Encounter for adjustment and management of vascular access device: Secondary | ICD-10-CM

## 2013-05-27 LAB — CBC WITH DIFFERENTIAL/PLATELET
BASO%: 1.8 % (ref 0.0–2.0)
Basophils Absolute: 0 10*3/uL (ref 0.0–0.1)
EOS ABS: 0 10*3/uL (ref 0.0–0.5)
EOS%: 1.8 % (ref 0.0–7.0)
HCT: 26.3 % — ABNORMAL LOW (ref 38.4–49.9)
HGB: 8.9 g/dL — ABNORMAL LOW (ref 13.0–17.1)
LYMPH%: 56.4 % — ABNORMAL HIGH (ref 14.0–49.0)
MCH: 32.1 pg (ref 27.2–33.4)
MCHC: 33.8 g/dL (ref 32.0–36.0)
MCV: 94.9 fL (ref 79.3–98.0)
MONO#: 0.2 10*3/uL (ref 0.1–0.9)
MONO%: 30.9 % — ABNORMAL HIGH (ref 0.0–14.0)
NEUT%: 9.1 % — ABNORMAL LOW (ref 39.0–75.0)
NEUTROS ABS: 0.1 10*3/uL — AB (ref 1.5–6.5)
Platelets: 15 10*3/uL — ABNORMAL LOW (ref 140–400)
RBC: 2.77 10*6/uL — AB (ref 4.20–5.82)
RDW: 13.4 % (ref 11.0–14.6)
WBC: 0.6 10*3/uL — AB (ref 4.0–10.3)
lymph#: 0.3 10*3/uL — ABNORMAL LOW (ref 0.9–3.3)

## 2013-05-27 LAB — TECHNOLOGIST REVIEW: Technologist Review: 4

## 2013-05-27 LAB — CHROMOSOME ANALYSIS, BONE MARROW

## 2013-05-27 MED ORDER — FLUCONAZOLE 100 MG PO TABS: 100.0000 mg | ORAL_TABLET | Freq: Every day | ORAL | Status: DC

## 2013-05-27 MED ORDER — HEPARIN SOD (PORK) LOCK FLUSH 100 UNIT/ML IV SOLN
250.0000 [IU] | Freq: Once | INTRAVENOUS | Status: AC
  Administered 2013-05-27: 250 [IU] via INTRAVENOUS
  Filled 2013-05-27: qty 5

## 2013-05-27 MED ORDER — PEGFILGRASTIM INJECTION 6 MG/0.6ML
6.0000 mg | Freq: Once | SUBCUTANEOUS | Status: AC
  Administered 2013-05-27: 6 mg via SUBCUTANEOUS
  Filled 2013-05-27: qty 0.6

## 2013-05-27 MED ORDER — SODIUM CHLORIDE 0.9 % IJ SOLN
10.0000 mL | Freq: Once | INTRAMUSCULAR | Status: AC
  Administered 2013-05-27: 10 mL via INTRAVENOUS
  Filled 2013-05-27: qty 10

## 2013-05-27 NOTE — Telephone Encounter (Signed)
Pt came in today for PICC line flush with CBC.  Per assessment pt states onset of " white coating in mouth" with noted milk like film on tongue and gums.  Robet has developed 1 large bruise on his leg.  Pt's wife wanted to verify dose of prednisone " was only for 2 days " which he completed.  She also states " gout medicine says to drink 10 glasses of water with it and he is not nearly drinking that much ".  Labs and pt/wife concerns reviewed with MD and obtained orders for neulasta and prescription for diflucan.  Reviewed above medications as well as answered questions regarding noted fatigue - " at home he is basically getting up and using the bathroom "   This RN discussed chemo/disease induced fatigue and management.  Pt wife understands to call with concerns and if pt develops fever or shaking chills.  No other needs at this time.

## 2013-05-27 NOTE — Patient Instructions (Signed)
PICC Home Guide A peripherally inserted central catheter (PICC) is a long, thin, flexible tube that is inserted into a vein in the upper arm. It is a form of intravenous (IV) access. It is considered to be a "central" line because the tip of the PICC ends in a large vein in your chest. This large vein is called the superior vena cava (SVC). The PICC tip ends in the SVC because there is a lot of blood flow in the SVC. This allows medicines and IV fluids to be quickly distributed throughout the body. The PICC is inserted using a sterile technique by a specially trained nurse or physician. After the PICC is inserted, a chest X-ray exam is done to be sure it is in the correct place.  A PICC may be placed for different reasons, such as:  To give medicines and liquid nutrition that can only be given through a central line. Examples are:  Certain antibiotic treatments.  Chemotherapy.  Total parenteral nutrition (TPN).  To take frequent blood samples.  To give IV fluids and blood products.  If there is difficulty placing a peripheral intravenous (PIV) catheter. If taken care of properly, a PICC can remain in place for several months. A PICC can also allow a person to go home from the hospital early. Medicine and PICC care can be managed at home by a family member or home health care team. WHAT PROBLEMS CAN HAPPEN WHEN I HAVE A PICC? Problems with a PICC can occasionally occur. These may include:  A blood clot (thrombus) forming in or at the tip of the PICC. This can cause the PICC to become clogged. A clot-dissolving medicine called tissue plasminogen activator (tPA) can be given through the PICC to help break up the clot.  Inflammation of the vein (phlebitis) in which the PICC is placed. Signs of inflammation may include redness, pain at the insertion site, red streaks, or being able to feel a "cord" in the vein where the PICC is located.  Infection in the PICC or at the insertion site. Signs of  infection may include fever, chills, redness, swelling, or pus drainage from the PICC insertion site.  PICC movement (malposition). The PICC tip may move from its original position due to excessive physical activity, forceful coughing, sneezing, or vomiting.  A break or cut in the PICC. It is important to not use scissors near the PICC.  Nerve or tendon irritation or injury during PICC insertion. WHAT SHOULD I KEEP IN MIND ABOUT ACTIVITIES WHEN I HAVE A PICC?  You may bend your arm and move it freely. If your PICC is near or at the bend of your elbow, avoid activity with repeated motion at the elbow.  Rest at home for the remainder of the day following PICC line insertion.  Avoid lifting heavy objects as instructed by your health care provider.  Avoid using a crutch with the arm on the same side as your PICC. You may need to use a walker. WHAT SHOULD I KNOW ABOUT MY PICC DRESSING?  Keep your PICC bandage (dressing) clean and dry to prevent infection.  Ask your health care provider when you may shower. Ask your health care provider to teach you how to wrap the PICC when you do take a shower.  Change the PICC dressing as instructed by your health care provider.  Change your PICC dressing if it becomes loose or wet. WHAT SHOULD I KNOW ABOUT PICC CARE?  Check the PICC insertion site daily for   leakage, redness, swelling, or pain.  Do not take a bath, swim, or use hot tubs when you have a PICC. Cover PICC line with clear plastic wrap and tape to keep it dry while showering.  Flush the PICC as directed by your health care provider. Let your health care provider know right away if the PICC is difficult to flush or does not flush. Do not use force to flush the PICC.  Do not use a syringe that is less than 10 mL to flush the PICC.  Never pull or tug on the PICC.  Avoid blood pressure checks on the arm with the PICC.  Keep your PICC identification card with you at all times.  Do not  take the PICC out yourself. Only a trained clinical professional should remove the PICC. SEEK IMMEDIATE MEDICAL CARE IF:  Your PICC is accidently pulled all the way out. If this happens, cover the insertion site with a bandage or gauze dressing. Do not throw the PICC away. Your health care provider will need to inspect it.  Your PICC was tugged or pulled and has partially come out. Do not  push the PICC back in.  There is any type of drainage, redness, or swelling where the PICC enters the skin.  You cannot flush the PICC, it is difficult to flush, or the PICC leaks around the insertion site when it is flushed.  You hear a "flushing" sound when the PICC is flushed.  You have pain, discomfort, or numbness in your arm, shoulder, or jaw on the same side as the PICC.  You feel your heart "racing" or skipping beats.  You notice a hole or tear in the PICC.  You develop chills or a fever. MAKE SURE YOU:   Understand these instructions.  Will watch your condition.  Will get help right away if you are not doing well or get worse. Document Released: 07/31/2002 Document Revised: 11/14/2012 Document Reviewed: 10/01/2012 [employer] Patient Information 2014 [employer].  

## 2013-05-28 ENCOUNTER — Telehealth: Payer: Self-pay | Admitting: *Deleted

## 2013-05-28 NOTE — Telephone Encounter (Signed)
Called pt to inform him that Dr. Jana Hakim is not able to see him and I need to reschedule his appt w/ Mendel Ryder for that afternoon.  Left a message for him to call me back.

## 2013-05-29 ENCOUNTER — Telehealth: Payer: Self-pay | Admitting: Adult Health

## 2013-05-29 ENCOUNTER — Encounter: Payer: Self-pay | Admitting: Adult Health

## 2013-05-29 ENCOUNTER — Ambulatory Visit (HOSPITAL_BASED_OUTPATIENT_CLINIC_OR_DEPARTMENT_OTHER): Payer: No Typology Code available for payment source

## 2013-05-29 ENCOUNTER — Ambulatory Visit: Payer: No Typology Code available for payment source | Admitting: Oncology

## 2013-05-29 ENCOUNTER — Ambulatory Visit: Payer: No Typology Code available for payment source

## 2013-05-29 ENCOUNTER — Ambulatory Visit (HOSPITAL_BASED_OUTPATIENT_CLINIC_OR_DEPARTMENT_OTHER): Payer: No Typology Code available for payment source | Admitting: Adult Health

## 2013-05-29 ENCOUNTER — Other Ambulatory Visit: Payer: Self-pay | Admitting: Oncology

## 2013-05-29 ENCOUNTER — Telehealth: Payer: Self-pay | Admitting: *Deleted

## 2013-05-29 VITALS — BP 114/70 | HR 116 | Temp 98.7°F | Resp 19 | Ht 64.0 in | Wt 178.1 lb

## 2013-05-29 DIAGNOSIS — Z452 Encounter for adjustment and management of vascular access device: Secondary | ICD-10-CM

## 2013-05-29 DIAGNOSIS — C8590 Non-Hodgkin lymphoma, unspecified, unspecified site: Secondary | ICD-10-CM

## 2013-05-29 DIAGNOSIS — C8589 Other specified types of non-Hodgkin lymphoma, extranodal and solid organ sites: Secondary | ICD-10-CM

## 2013-05-29 LAB — CBC & DIFF AND RETIC
BASO%: 1.6 % (ref 0.0–2.0)
Basophils Absolute: 0.1 10*3/uL (ref 0.0–0.1)
EOS%: 0.2 % (ref 0.0–7.0)
Eosinophils Absolute: 0 10*3/uL (ref 0.0–0.5)
HEMATOCRIT: 27 % — AB (ref 38.4–49.9)
HGB: 9.1 g/dL — ABNORMAL LOW (ref 13.0–17.1)
IMMATURE RETIC FRACT: 29.9 % — AB (ref 3.00–10.60)
LYMPH%: 16 % (ref 14.0–49.0)
MCH: 32.3 pg (ref 27.2–33.4)
MCHC: 33.7 g/dL (ref 32.0–36.0)
MCV: 95.7 fL (ref 79.3–98.0)
MONO#: 0.6 10*3/uL (ref 0.1–0.9)
MONO%: 13.1 % (ref 0.0–14.0)
NEUT#: 3.4 10*3/uL (ref 1.5–6.5)
NEUT%: 69.1 % (ref 39.0–75.0)
Platelets: 30 10*3/uL — ABNORMAL LOW (ref 140–400)
RBC: 2.82 10*6/uL — ABNORMAL LOW (ref 4.20–5.82)
RDW: 13.6 % (ref 11.0–14.6)
RETIC %: 1.61 % (ref 0.80–1.80)
RETIC CT ABS: 45.4 10*3/uL (ref 34.80–93.90)
WBC: 4.9 10*3/uL (ref 4.0–10.3)
lymph#: 0.8 10*3/uL — ABNORMAL LOW (ref 0.9–3.3)

## 2013-05-29 LAB — COMPREHENSIVE METABOLIC PANEL (CC13)
ALK PHOS: 323 U/L — AB (ref 40–150)
ALT: 13 U/L (ref 0–55)
AST: 18 U/L (ref 5–34)
Albumin: 2.5 g/dL — ABNORMAL LOW (ref 3.5–5.0)
Anion Gap: 8 mEq/L (ref 3–11)
BILIRUBIN TOTAL: 0.63 mg/dL (ref 0.20–1.20)
BUN: 5.9 mg/dL — ABNORMAL LOW (ref 7.0–26.0)
CO2: 23 meq/L (ref 22–29)
CREATININE: 0.7 mg/dL (ref 0.7–1.3)
Calcium: 8.4 mg/dL (ref 8.4–10.4)
Chloride: 102 mEq/L (ref 98–109)
Glucose: 147 mg/dl — ABNORMAL HIGH (ref 70–140)
Potassium: 3.9 mEq/L (ref 3.5–5.1)
SODIUM: 133 meq/L — AB (ref 136–145)
TOTAL PROTEIN: 5.3 g/dL — AB (ref 6.4–8.3)

## 2013-05-29 LAB — TISSUE HYBRIDIZATION TO NCBH

## 2013-05-29 MED ORDER — HEPARIN SOD (PORK) LOCK FLUSH 100 UNIT/ML IV SOLN
500.0000 [IU] | Freq: Once | INTRAVENOUS | Status: AC
  Administered 2013-05-29: 250 [IU] via INTRAVENOUS
  Filled 2013-05-29: qty 5

## 2013-05-29 MED ORDER — SODIUM CHLORIDE 0.9 % IJ SOLN
10.0000 mL | INTRAMUSCULAR | Status: DC | PRN
  Administered 2013-05-29: 10 mL via INTRAVENOUS
  Filled 2013-05-29: qty 10

## 2013-05-29 NOTE — Telephone Encounter (Signed)
Pt's wife Stanton Kidney) returned my call and I explained the reasoning for having to reschedule the appt and she was fine with that and I confirmed w/ her for pt to come in and see Mendel Ryder at 1:45 today.

## 2013-05-29 NOTE — Telephone Encounter (Signed)
per pof to sch pt appt & flush every other day/cld & talkwed w/wife to adv next appt 4/24@11 :30-wife understood/adv ised i would mail copy of sch

## 2013-05-29 NOTE — Telephone Encounter (Signed)
per pof to sch pt appt-LC wants pt to have labs today-sent to lab-gave pt copy of  sch

## 2013-05-29 NOTE — Patient Instructions (Signed)

## 2013-05-30 ENCOUNTER — Other Ambulatory Visit: Payer: Self-pay | Admitting: Oncology

## 2013-05-30 NOTE — Progress Notes (Unsigned)
We got a copy of a do is FISH study, which show a MYC mutation (cytogenetics were normal). 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 Gerald Dexter at [facility] and lead [facility] direct his care. He has an appointment to see me 06/03/2013, which is next Monday, and he should keep that unless he is out of town already, because I can orient him and his wife a better in person I can by fall. Of course I will continue to be his doctor and I will continue to provide supportive care here but I would prefer that be done under dr Diehl's direction.  He will call with any problems that may develop before his next visit here

## 2013-05-31 ENCOUNTER — Ambulatory Visit (HOSPITAL_BASED_OUTPATIENT_CLINIC_OR_DEPARTMENT_OTHER): Payer: No Typology Code available for payment source

## 2013-05-31 ENCOUNTER — Other Ambulatory Visit: Payer: Self-pay | Admitting: Oncology

## 2013-05-31 VITALS — BP 98/51 | HR 68 | Temp 97.5°F

## 2013-05-31 DIAGNOSIS — Z452 Encounter for adjustment and management of vascular access device: Secondary | ICD-10-CM

## 2013-05-31 DIAGNOSIS — C8589 Other specified types of non-Hodgkin lymphoma, extranodal and solid organ sites: Secondary | ICD-10-CM

## 2013-05-31 MED ORDER — SODIUM CHLORIDE 0.9 % IJ SOLN
10.0000 mL | INTRAMUSCULAR | Status: DC | PRN
  Administered 2013-05-31: 10 mL via INTRAVENOUS
  Filled 2013-05-31: qty 10

## 2013-05-31 MED ORDER — HEPARIN SOD (PORK) LOCK FLUSH 100 UNIT/ML IV SOLN
500.0000 [IU] | Freq: Once | INTRAVENOUS | Status: AC
  Administered 2013-05-31: 250 [IU] via INTRAVENOUS
  Filled 2013-05-31: qty 5

## 2013-05-31 MED ORDER — VENLAFAXINE HCL ER 37.5 MG PO CP24: 37.5000 mg | ORAL_CAPSULE | Freq: Every day | ORAL | Status: AC

## 2013-05-31 NOTE — Progress Notes (Unsigned)
Mr. [name] [name] came today for PICC flush and labs. She is clinically doing better. He did not want to stay to discuss the situation. I met with his wife and explained to her that this is a lymphoma just as we thought, it appears to be the most aggressive type of lymphoma and that is why I am referring him to [facility] for help in directing his care. She understands that w I. will continue to be his doctor, but that we do not treat this day and day out and we will all benefit from getting someone was more experienced in this particular type of lymphoma helping direct his care.  She understood that point. I went ahead and stop the lisinopril, since to his blood pressure is a little bit lower, and started venlafaxine Bexxar 37.5. He comes on Monday for a visit. Hopefully he will have been contacted by [facility] to schedule an appointment before the Monday visit.

## 2013-05-31 NOTE — Progress Notes (Signed)
Patient in for PICC flush and dressing change today. BP is 98/51. Patient states, "That's my normal BP. I feel fine though." Denies any pain or discomfort at this time. Denies any feelings of dizziness. Encouraged to call [facility] if condition worsens. Patient verbalized understanding.

## 2013-05-31 NOTE — Patient Instructions (Signed)
PICC Home Guide A peripherally inserted central catheter (PICC) is a long, thin, flexible tube that is inserted into a vein in the upper arm. It is a form of intravenous (IV) access. It is considered to be a "central" line because the tip of the PICC ends in a large vein in your chest. This large vein is called the superior vena cava (SVC). The PICC tip ends in the SVC because there is a lot of blood flow in the SVC. This allows medicines and IV fluids to be quickly distributed throughout the body. The PICC is inserted using a sterile technique by a specially trained nurse or physician. After the PICC is inserted, a chest X-ray exam is done to be sure it is in the correct place.  A PICC may be placed for different reasons, such as:  To give medicines and liquid nutrition that can only be given through a central line. Examples are:  Certain antibiotic treatments.  Chemotherapy.  Total parenteral nutrition (TPN).  To take frequent blood samples.  To give IV fluids and blood products.  If there is difficulty placing a peripheral intravenous (PIV) catheter. If taken care of properly, a PICC can remain in place for several months. A PICC can also allow a person to go home from the hospital early. Medicine and PICC care can be managed at home by a family member or home health care team. WHAT PROBLEMS CAN HAPPEN WHEN I HAVE A PICC? Problems with a PICC can occasionally occur. These may include:  A blood clot (thrombus) forming in or at the tip of the PICC. This can cause the PICC to become clogged. A clot-dissolving medicine called tissue plasminogen activator (tPA) can be given through the PICC to help break up the clot.  Inflammation of the vein (phlebitis) in which the PICC is placed. Signs of inflammation may include redness, pain at the insertion site, red streaks, or being able to feel a "cord" in the vein where the PICC is located.  Infection in the PICC or at the insertion site. Signs of  infection may include fever, chills, redness, swelling, or pus drainage from the PICC insertion site.  PICC movement (malposition). The PICC tip may move from its original position due to excessive physical activity, forceful coughing, sneezing, or vomiting.  A break or cut in the PICC. It is important to not use scissors near the PICC.  Nerve or tendon irritation or injury during PICC insertion. WHAT SHOULD I KEEP IN MIND ABOUT ACTIVITIES WHEN I HAVE A PICC?  You may bend your arm and move it freely. If your PICC is near or at the bend of your elbow, avoid activity with repeated motion at the elbow.  Rest at home for the remainder of the day following PICC line insertion.  Avoid lifting heavy objects as instructed by your health care provider.  Avoid using a crutch with the arm on the same side as your PICC. You may need to use a walker. WHAT SHOULD I KNOW ABOUT MY PICC DRESSING?  Keep your PICC bandage (dressing) clean and dry to prevent infection.  Ask your health care provider when you may shower. Ask your health care provider to teach you how to wrap the PICC when you do take a shower.  Change the PICC dressing as instructed by your health care provider.  Change your PICC dressing if it becomes loose or wet. WHAT SHOULD I KNOW ABOUT PICC CARE?  Check the PICC insertion site daily for   leakage, redness, swelling, or pain.  Do not take a bath, swim, or use hot tubs when you have a PICC. Cover PICC line with clear plastic wrap and tape to keep it dry while showering.  Flush the PICC as directed by your health care provider. Let your health care provider know right away if the PICC is difficult to flush or does not flush. Do not use force to flush the PICC.  Do not use a syringe that is less than 10 mL to flush the PICC.  Never pull or tug on the PICC.  Avoid blood pressure checks on the arm with the PICC.  Keep your PICC identification card with you at all times.  Do not  take the PICC out yourself. Only a trained clinical professional should remove the PICC. SEEK IMMEDIATE MEDICAL CARE IF:  Your PICC is accidently pulled all the way out. If this happens, cover the insertion site with a bandage or gauze dressing. Do not throw the PICC away. Your health care provider will need to inspect it.  Your PICC was tugged or pulled and has partially come out. Do not  push the PICC back in.  There is any type of drainage, redness, or swelling where the PICC enters the skin.  You cannot flush the PICC, it is difficult to flush, or the PICC leaks around the insertion site when it is flushed.  You hear a "flushing" sound when the PICC is flushed.  You have pain, discomfort, or numbness in your arm, shoulder, or jaw on the same side as the PICC.  You feel your heart "racing" or skipping beats.  You notice a hole or tear in the PICC.  You develop chills or a fever. MAKE SURE YOU:   Understand these instructions.  Will watch your condition.  Will get help right away if you are not doing well or get worse. Document Released: 07/31/2002 Document Revised: 11/14/2012 Document Reviewed: 10/01/2012 [employer] Patient Information 2014 [employer].  

## 2013-06-01 NOTE — Progress Notes (Signed)
Hematology and Oncology Follow Up Visit  [name] [name] [id number] 1956-01-26 58 y.o. 06/01/2013 1:43 PM     Principle Diagnosis:[name] [name] 58 y.o. male with Burkitt's lymphoma.   HPI: The patient is a 58 year old male with h/o hypertension, PUD, GERD, DM-2, hyperlipidemia who underwent an extensive chest pain work up during a previous hospitalization from 04/23/13 through 04/25/13 that was negative for a cardiac etiology and for a PE.  He presented for an upper endoscopy for a GI eval of his chest pain, where labs demonstrated a sodium level of 116, a potassium of 5.4, a calcium of 11.1, and plts of 80k, and elevated LFT.  He was sent from the endoscopy suite to the ER on 05/06/13. .  During his hospitalization his sodium was corrected, he underwent an EGD that only showed gastritis, and no cause of his pain.  During his work up he was noted to have abnormal LFTs leading to an abdominal ultrasound, followed by an abdominal MRI, then a CT of the abdomen.  The patient who admits to previously drinking up to 72 ounces of beer per day, and the liver lesions seen on ultrasound appear to be regenerating nodules, as he may have been developing cirrhosis.  However, the CT of the abdomen and pelvis demonstrated an unexpected lymphoid mass in the right iliac area.  Hematology/oncology was consulted for evaluation of the patient's thrombocytopenia.  His platelets were normal on 05/01/13, 84 on 05/06/13, and in the 60s beginning on 05/08/13.  LDH was 3192 on 05/06/2013.  The patient also had hypercalcemia with a calcium level around 11 during his hospitalization.  He did receive Pamidronate for his hypocalcemia on 05/12/13. 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  Immunohistochemistry revealed cells that were positive for CD20, CD79a, CD10, bcl-6, and bcl-2.  Bone marrow biopsy on 05/15/13 demonstrated a hypercellular marrow with high  grade lymphoma involvement, however flow was negative and attributed to necrosis.  PET scan on 05/16/13 demonstrated a large right pelvic nodal mass with a SUV of 16.6, diffuse uptake in the mesentary and small bowe, possibly due to lymphoma, but more likely a severe enteritis or drug reaction, and diffuse abnormal uptake in the bones suspicious for diffuse lymphomatous involvement. Due to these results the patient received R-CHOP on 05/17/13, with extensive hydration and rasburicase.  FISH results demonstrated BCL2 translocation and MYC translocation consistent with a poor prognosis.    Current therapy:  RCHOP cycle 1 day 12  Interim History: [name] [name] 58 y.o. male with high grade B cell lymphoma.  He was recently discharged from the hospital.  He did receive gcsf support and his white blood cells have responded with a WBC of 4.9 and anc of 3400 today compared to a WBC of 0.6 and anc of 100 on 05/27/13.  His platelets are also increased from 15k to 30k.  He is here to have his PICC flushed and dressing changed.  He is fatigued.  His daughter is with him today and does report that he is irritable.  He denies fevers, chills, pain, nausea, vomiting, constipation, diarrhea, mouth sores, numbness/tingling in his fingertips, or any further concerns.    Medications:  Current Outpatient Prescriptions  Medication Sig Dispense Refill  . allopurinol (ZYLOPRIM) 300 MG tablet Take 1 tablet (300 mg total) by mouth daily.  30 tablet  4  . esomeprazole (NEXIUM) 40 MG packet Take 40 mg by mouth daily  before breakfast.  30 each  3  . fluconazole (DIFLUCAN) 100 MG tablet Take 1 tablet (100 mg total) by mouth daily.  10 tablet  0  . lisinopril (PRINIVIL,ZESTRIL) 40 MG tablet TAKE 1 TABLET BY MOUTH DAILY. PLEASE RESCHEDULE YOUR 6 MONTH FOLLOW UP APPOINTMENT FOR ADDITIONAL REFILLS.  90 tablet  1  . metoCLOPramide (REGLAN) 5 MG tablet Take 1 tablet (5 mg total) by mouth 3 (three) times daily before meals.  90 tablet  4  .  metoprolol succinate (TOPROL-XL) 100 MG 24 hr tablet Take 100 mg by mouth daily. Take with or immediately following a meal.      . oxyCODONE-acetaminophen (PERCOCET/ROXICET) 5-325 MG per tablet Take 1-2 tablets by mouth every 3 (three) hours as needed for severe pain.  65 tablet  0  . bisacodyl (DULCOLAX) 5 MG EC tablet Take 1 tablet (5 mg total) by mouth daily as needed for moderate constipation.  30 tablet  0  . HYDROcodone-acetaminophen (NORCO) 10-325 MG per tablet       . polyethylene glycol (MIRALAX / GLYCOLAX) packet Take 17 g by mouth daily.  14 each  0  . venlafaxine XR (EFFEXOR-XR) 37.5 MG 24 hr capsule Take 1 capsule (37.5 mg total) by mouth daily with breakfast.  30 capsule  4   No current facility-administered medications for this visit.     Allergies:  Allergies  Allergen Reactions  . Penicillins Hives    Medical History: Past Medical History  Diagnosis Date  . GERD (gastroesophageal reflux disease)   . Hypertension   . Peptic ulcer   . Gout   . Hiatal hernia   . Diabetes mellitus without complication   . Hyperlipidemia   . Non Hodgkin's lymphoma     Surgical History:  Past Surgical History  Procedure Laterality Date  . Pilonidal cyst / sinus excision    . 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 Tonsillectomy    . Colonoscopy    . Esophagogastroduodenoscopy N/A 05/11/2013    Procedure: ESOPHAGOGASTRODUODENOSCOPY (EGD);  Surgeon: Milus Banister, MD;  Location: [facility];  Service: Endoscopy;  Laterality: N/A;     Review of Systems: A 10 point review of systems was conducted and is otherwise negative except for what is noted above.     Physical Exam: Blood pressure 114/70, pulse 116, temperature 98.7 F (37.1 C), temperature source Oral, resp. rate 19, height _0  (1.626 m), weight 178 lb 1.6 oz (80.786 kg). GENERAL: Patient is a chronically ill appearing male in no acute distress HEENT:  Sclerae anicteric.  Oropharynx clear and moist. No  ulcerations or evidence of oropharyngeal candidiasis. Neck is supple.  NODES:  No cervical, supraclavicular, or axillary lymphadenopathy palpated.  LUNGS:  Clear to auscultation bilaterally.  No wheezes or rhonchi. HEART:  Regular rate and rhythm. No murmur appreciated. ABDOMEN:  Soft, nontender.  Positive, normoactive bowel sounds. No organomegaly palpated. MSK:  No focal spinal tenderness to palpation. Full range of motion bilaterally in the upper extremities. EXTREMITIES:  No peripheral edema.   SKIN:  Clear with no obvious rashes or skin changes. No nail dyscrasia. NEURO:  Nonfocal. Well oriented.  Appropriate affect. ECOG PERFORMANCE STATUS: 1 - Symptomatic but completely ambulatory   Lab Results: Lab Results  Component Value Date   WBC 4.9 05/29/2013   HGB 9.1* 05/29/2013   HCT 27.0* 05/29/2013   MCV 95.7 05/29/2013   PLT 30* 05/29/2013     Chemistry  Component Value Date/Time   NA 133* 05/29/2013 1501   NA 138 05/22/2013 0530   K 3.9 05/29/2013 1501   K 4.1 05/22/2013 0530   CL 106 05/22/2013 0530   CO2 23 05/29/2013 1501   CO2 25 05/22/2013 0530   BUN 5.9* 05/29/2013 1501   BUN 10 05/22/2013 0530   CREATININE 0.7 05/29/2013 1501   CREATININE 0.51 05/22/2013 0530   CREATININE 0.83 04/23/2013 1710      Component Value Date/Time   CALCIUM 8.4 05/29/2013 1501   CALCIUM 6.9* 05/22/2013 0530   CALCIUM 8.0* 05/14/2013 0956   ALKPHOS 323* 05/29/2013 1501   ALKPHOS 214* 05/22/2013 0530   AST 18 05/29/2013 1501   AST 35 05/22/2013 0530   ALT 13 05/29/2013 1501   ALT 19 05/22/2013 0530   BILITOT 0.63 05/29/2013 1501   BILITOT 0.4 05/22/2013 0530       Radiological Studies:  CLINICAL DATA: Initial treatment strategy for lymphoma.  EXAM:  NUCLEAR MEDICINE PET SKULL BASE TO THIGH  TECHNIQUE:  10.0 mCi F-18 FDG was injected intravenously. Full-ring PET imaging  was performed from the skull base to thigh after the radiotracer. CT  data was obtained and used for attenuation correction and  anatomic  localization.  FASTING BLOOD GLUCOSE: Value: 88 mg/dl  COMPARISON: CT scan 05/10/2013  FINDINGS:  NECK  No hypermetabolic lymph nodes in the neck.  CHEST  No hypermetabolic mediastinal or hilar nodes. No suspicious  pulmonary nodules on the CT scan.  ABDOMEN/PELVIS  Interval development of diffuse mesenteric edema with free abdominal  and pelvic fluid. The small bowel appears thickened. There are  scattered mesenteric and retroperitoneal lymph nodes. There is  diffuse and fairly marked FDG uptake in the small bowel, mesenteric  and omentum. This could reflect diffuse lymphoma but I think it is  much more likely severe enteritis or possibly drug reaction from  chemotherapy. The large nodal mass in the right pelvis is markedly  hypermetabolic with SUV max of 38.2.  There may be small metabolically active liver lesions but this is  equivocal. I cannot really correlate these areas with the abnormal  MRI.  SKELETON  Diffuse abnormal nodular increased uptake in the osseous structures  concerning for osseous lymphoma.  IMPRESSION:  1. Large right pelvic nodal mass is FDG positive and consistent with  known lymphoma.  2. Diffuse uptake in the mesentery and small bowel could be due to  diffuse lymphoma involvement but is more likely a severe enteritis  or drug reaction.  3. Diffuse abnormal FDG uptake in bones is suspicious diffuse  lymphomatous involvement.  Electronically Signed  By: Kalman Jewels M.D.  On: 05/16/2013 14:27   *[place]* *[address] [employer] [place], [place] [address] [phone]  ------------------------------------------------------------ Transthoracic Echocardiography  Patient: Khaleed, Holan MR #: [id number] Study Date: 05/17/2013 Gender: M Age: 81 Height: 162.6cm Weight: 38.2kg BSA: 1.61m2 Pt. Status: Room: 1[place], GHazle NordmannADMITTING TIrine SealSONOGRAPHER Jimmy Reel, RDCS PERFORMING [facility], Inpatient cc:  ------------------------------------------------------------ LV EF: 60% - 65%  ------------------------------------------------------------ Indications: V58.11 Chemotherapy Evaluation.  ------------------------------------------------------------ Study Conclusions  Left ventricle: The cavity size was normal. Wall thickness was increased in a pattern of mild LVH. There was focal basal hypertrophy. Systolic function was normal. The estimated ejection fraction was in the range of 60% to 65%. Transthoracic echocardiography. M-mode, limited 2D, limited spectral Doppler, and color Doppler. Height: Height: 162.6cm. Height: 64in.  Weight: Weight: 38.2kg. Weight: 84.1lb. Body mass index: BMI: 14.5kg/m^2. Body surface area: BSA: 1.1m2. Blood pressure: 121/70. Patient status: Inpatient. Location: Bedside.  ------------------------------------------------------------  ------------------------------------------------------------ Left ventricle: The global longitudinal strain is reduced at -15.6 % but I do not think this is accurate. The LV endocardium is seen fairly well with the echo contrast. The EF can be seen to be normal. The cavity size was normal. Wall thickness was increased in a pattern of mild LVH. There was focal basal hypertrophy. Systolic function was normal. The estimated ejection fraction was in the range of 60% to 65%.  ------------------------------------------------------------ Aorta: Aortic root: The aortic root was normal in size. Ascending aorta: The ascending aorta was normal in size.  ------------------------------------------------------------ Left atrium: The atrium was normal in size.  ------------------------------------------------------------ Right ventricle: The cavity size was normal. Systolic function was  normal.  ------------------------------------------------------------ Pericardium: There was no pericardial effusion.  ------------------------------------------------------------  2D measurements Normal Doppler measurements Normal Left ventricle Left ventricle LVID ED, 42.5 mm 43-52 Ea, lat ann, 9.1 cm/s ------ chord, tiss DP PLAX Ea, med ann, 6.9 cm/s ------ LVID ES, 30.8 mm 23-38 tiss DP chord, Systemic veins PLAX Estimated 3 mm ------ FS, chord, 28 % >29 CVP Hg PLAX LVPW, ED 12.2 mm ------ IVS/LVPW 0.94 <1.3 ratio, ED Ventricular septum IVS, ED 11.5 mm ------ Aorta Root diam, 34 mm ------ ED Left atrium AP dim 33 mm ------ AP dim 2.44 cm/m^2 <2.2 index  ------------------------------------------------------------ Prepared and Electronically Authenticated by  NMertie Moores2015-04-10T17:02:[id number]  Assessment and Plan: [name] Hose89y.o. male with  1. High grade b cell NHL.  Patient is s/p his first cycle of RCHOP chemotherapy.  I reviewed his CBC with him in detail.  It is improved from Monday which is encouraging.  His FISH results demonstrate BCL2 and MYC translocations. This is associated with a poor prognosis.  I reviewed this with Dr. MJana Hakim  We will refer him to [facility] to Dr. LOris Dronefor a second opinion.    2. Cardiac:  Patient underwent a pre-chemotherapy echocardiogram on 05/17/13 that demonstrated a LVEF of 60-65%.    3. PICC Line: Patient is seen in our flush room for PICC line flushes on Monday, Wednesday and Friday, and has his dressing changed weekly.  There are no signs of infection.    The patient will return on Monday for labs and evaluation.  He and his daughter know to call [facility]ain the interim for any questions or concerns.  We can certainly see him sooner if needed.  I spent 25 minutes counseling the patient face to face.  The total time spent in the appointment was 30 minutes.  LMinette Headland NCrystal [phone]/25/2015 1:43 PM

## 2013-06-03 ENCOUNTER — Telehealth: Payer: Self-pay | Admitting: Oncology

## 2013-06-03 ENCOUNTER — Ambulatory Visit (HOSPITAL_BASED_OUTPATIENT_CLINIC_OR_DEPARTMENT_OTHER): Payer: No Typology Code available for payment source | Admitting: Adult Health

## 2013-06-03 ENCOUNTER — Other Ambulatory Visit (HOSPITAL_BASED_OUTPATIENT_CLINIC_OR_DEPARTMENT_OTHER): Payer: No Typology Code available for payment source

## 2013-06-03 ENCOUNTER — Other Ambulatory Visit: Payer: Self-pay | Admitting: Oncology

## 2013-06-03 ENCOUNTER — Encounter: Payer: Self-pay | Admitting: Adult Health

## 2013-06-03 ENCOUNTER — Ambulatory Visit (HOSPITAL_BASED_OUTPATIENT_CLINIC_OR_DEPARTMENT_OTHER): Payer: No Typology Code available for payment source

## 2013-06-03 ENCOUNTER — Telehealth: Payer: Self-pay | Admitting: Adult Health

## 2013-06-03 VITALS — BP 132/80 | HR 111 | Temp 98.3°F | Resp 20 | Ht 64.0 in | Wt 173.2 lb

## 2013-06-03 DIAGNOSIS — C8590 Non-Hodgkin lymphoma, unspecified, unspecified site: Secondary | ICD-10-CM

## 2013-06-03 DIAGNOSIS — E348 Other specified endocrine disorders: Secondary | ICD-10-CM

## 2013-06-03 DIAGNOSIS — C8589 Other specified types of non-Hodgkin lymphoma, extranodal and solid organ sites: Secondary | ICD-10-CM

## 2013-06-03 DIAGNOSIS — C8379 Burkitt lymphoma, extranodal and solid organ sites: Secondary | ICD-10-CM

## 2013-06-03 DIAGNOSIS — Z452 Encounter for adjustment and management of vascular access device: Secondary | ICD-10-CM

## 2013-06-03 LAB — CBC WITH DIFFERENTIAL/PLATELET
BASO%: 1.1 % (ref 0.0–2.0)
BASOS ABS: 0.2 10*3/uL — AB (ref 0.0–0.1)
EOS%: 0.1 % (ref 0.0–7.0)
Eosinophils Absolute: 0 10*3/uL (ref 0.0–0.5)
HCT: 24 % — ABNORMAL LOW (ref 38.4–49.9)
HEMOGLOBIN: 7.9 g/dL — AB (ref 13.0–17.1)
LYMPH#: 2 10*3/uL (ref 0.9–3.3)
LYMPH%: 11.8 % — ABNORMAL LOW (ref 14.0–49.0)
MCH: 31.9 pg (ref 27.2–33.4)
MCHC: 32.9 g/dL (ref 32.0–36.0)
MCV: 96.8 fL (ref 79.3–98.0)
MONO#: 1.7 10*3/uL — ABNORMAL HIGH (ref 0.1–0.9)
MONO%: 9.9 % (ref 0.0–14.0)
NEUT#: 12.8 10*3/uL — ABNORMAL HIGH (ref 1.5–6.5)
NEUT%: 77.1 % — ABNORMAL HIGH (ref 39.0–75.0)
Platelets: 53 10*3/uL — ABNORMAL LOW (ref 140–400)
RBC: 2.48 10*6/uL — ABNORMAL LOW (ref 4.20–5.82)
RDW: 14.8 % — ABNORMAL HIGH (ref 11.0–14.6)
WBC: 16.6 10*3/uL — AB (ref 4.0–10.3)

## 2013-06-03 MED ORDER — HEPARIN SOD (PORK) LOCK FLUSH 100 UNIT/ML IV SOLN
500.0000 [IU] | Freq: Once | INTRAVENOUS | Status: AC
  Administered 2013-06-03: 500 [IU] via INTRAVENOUS
  Filled 2013-06-03: qty 5

## 2013-06-03 MED ORDER — SODIUM CHLORIDE 0.9 % IJ SOLN
10.0000 mL | INTRAMUSCULAR | Status: DC | PRN
  Administered 2013-06-03: 10 mL via INTRAVENOUS
  Filled 2013-06-03: qty 10

## 2013-06-03 NOTE — Progress Notes (Addendum)
Hematology and Oncology Follow Up Visit  [name] [name] [id number] March 03, 1955 58 y.o. 06/04/2013 11:01 PM     Principle Diagnosis:[name] [name] 58 y.o. male with Burkitt's lymphoma.   HPI: The patient is a 58 year old male with h/o hypertension, PUD, GERD, DM-2, hyperlipidemia who underwent an extensive chest pain work up during a previous hospitalization from 04/23/13 through 04/25/13 that was negative for a cardiac etiology and for a PE.  He presented for an upper endoscopy for a GI eval of his chest pain, where labs demonstrated a sodium level of 116, a potassium of 5.4, a calcium of 11.1, and plts of 80k, and elevated LFT.  He was sent from the endoscopy suite to the ER on 05/06/13. .  During his hospitalization his sodium was corrected, he underwent an EGD that only showed gastritis, and no cause of his pain.  During his work up he was noted to have abnormal LFTs leading to an abdominal ultrasound, followed by an abdominal MRI, then a CT of the abdomen.  The patient who admits to previously drinking up to 72 ounces of beer per day, and the liver lesions seen on ultrasound appear to be regenerating nodules, as he may have been developing cirrhosis.  However, the CT of the abdomen and pelvis demonstrated an unexpected lymphoid mass in the right iliac area.  Hematology/oncology was consulted for evaluation of the patient's thrombocytopenia.  His platelets were normal on 05/01/13, 84 on 05/06/13, and in the 60s beginning on 05/08/13.  LDH was 3192 on 05/06/2013.  The patient also had hypercalcemia with a calcium level around 11 during his hospitalization.  He did receive Pamidronate for his hypocalcemia on 05/12/13. 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  Immunohistochemistry revealed cells that were positive for CD20, CD79a, CD10, bcl-6, and bcl-2.  Bone marrow biopsy on 05/15/13 demonstrated a hypercellular marrow with high  grade lymphoma involvement, however flow was negative and attributed to necrosis.  PET scan on 05/16/13 demonstrated a large right pelvic nodal mass with a SUV of 16.6, diffuse uptake in the mesentary and small bowe, possibly due to lymphoma, but more likely a severe enteritis or drug reaction, and diffuse abnormal uptake in the bones suspicious for diffuse lymphomatous involvement. Due to these results the patient received R-CHOP on 05/17/13, with extensive hydration and rasburicase.  FISH results demonstrated BCL2 translocation and MYC translocation consistent with a poor prognosis.    Current therapy:  RCHOP cycle 1 day 18  Interim History: [name] [name] 58 y.o. male with [name] [name] who is here today for evaluation.  He is accompanied by his wife for today's appointment.  He is feeling much improved than when he was evaluated last week.  He has more energy.  His appetite is improving.  He denies fevers, chills, nausea, vomiting, constipation, diarrhea, mouth pain, numbness/tingling (already has a baseline of numbness on the soles of his feet due to diabetic neuropathy that has been ongoing for 3-4 years that has been managed with lifestyle modifications).  He is anemic today with a hemoglobin of 7.9.  He denies chest pain, palpitations, headache, DOE, fatigue, or any other concerns.  A 10 point ROS is otherwise negative.    Medications:  Current Outpatient Prescriptions  Medication Sig Dispense Refill  . allopurinol (ZYLOPRIM) 300 MG tablet Take 1 tablet (300 mg total) by mouth daily.  30 tablet  4  . bisacodyl (DULCOLAX) 5 MG EC  tablet Take 1 tablet (5 mg total) by mouth daily as needed for moderate constipation.  30 tablet  0  . esomeprazole (NEXIUM) 40 MG packet Take 40 mg by mouth daily before breakfast.  30 each  3  . fluconazole (DIFLUCAN) 100 MG tablet Take 1 tablet (100 mg total) by mouth daily.  10 tablet  0  . metoprolol succinate (TOPROL-XL) 100 MG 24 hr tablet Take 100 mg by mouth  daily. Take with or immediately following a meal.      . oxyCODONE-acetaminophen (PERCOCET/ROXICET) 5-325 MG per tablet Take 1-2 tablets by mouth every 3 (three) hours as needed for severe pain.  65 tablet  0  . venlafaxine XR (EFFEXOR-XR) 37.5 MG 24 hr capsule Take 1 capsule (37.5 mg total) by mouth daily with breakfast.  30 capsule  4  . HYDROcodone-acetaminophen (NORCO) 10-325 MG per tablet       . lisinopril (PRINIVIL,ZESTRIL) 40 MG tablet TAKE 1 TABLET BY MOUTH DAILY. PLEASE RESCHEDULE YOUR 6 MONTH FOLLOW UP APPOINTMENT FOR ADDITIONAL REFILLS.  90 tablet  1  . metoCLOPramide (REGLAN) 5 MG tablet Take 1 tablet (5 mg total) by mouth 3 (three) times daily before meals.  90 tablet  4  . polyethylene glycol (MIRALAX / GLYCOLAX) packet Take 17 g by mouth daily.  14 each  0   No current facility-administered medications for this visit.     Allergies:  Allergies  Allergen Reactions  . Penicillins Hives    Medical History: Past Medical History  Diagnosis Date  . GERD (gastroesophageal reflux disease)   . Hypertension   . Peptic ulcer   . Gout   . Hiatal hernia   . Diabetes mellitus without complication   . Hyperlipidemia   . Non Hodgkin's lymphoma     Surgical History:  Past Surgical History  Procedure Laterality Date  . Pilonidal cyst / sinus excision    . 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 Tonsillectomy    . Colonoscopy    . Esophagogastroduodenoscopy N/A 05/11/2013    Procedure: ESOPHAGOGASTRODUODENOSCOPY (EGD);  Surgeon: Milus Banister, MD;  Location: [facility];  Service: Endoscopy;  Laterality: N/A;     Review of Systems: A 10 point review of systems was conducted and is otherwise negative except for what is noted above.     Physical Exam: Blood pressure 132/80, pulse 111, temperature 98.3 F (36.8 C), temperature source Oral, resp. rate 20, height $RemoveBe'5\' 4"'vHaufZcKi$  (1.626 m), weight 173 lb 3.2 oz (78.563 kg). GENERAL: Patient is a well appearing male in no acute  distress HEENT:  Sclerae anicteric.  Oropharynx clear and moist. No ulcerations or evidence of oropharyngeal candidiasis. Neck is supple.  NODES:  No cervical, supraclavicular, or axillary lymphadenopathy palpated.  LUNGS:  Clear to auscultation bilaterally.  No wheezes or rhonchi. HEART:  Regular rate and rhythm. No murmur appreciated. ABDOMEN:  Soft, nontender.  Positive, normoactive bowel sounds. No organomegaly palpated. MSK:  No focal spinal tenderness to palpation. Full range of motion bilaterally in the upper extremities. EXTREMITIES:  No peripheral edema.   SKIN:  Clear with no obvious rashes or skin changes. No nail dyscrasia. NEURO:  Nonfocal. Well oriented.  Appropriate affect. ECOG PERFORMANCE STATUS: 1 - Symptomatic but completely ambulatory   Lab Results: Lab Results  Component Value Date   WBC 16.6* 06/03/2013   HGB 7.9* 06/03/2013   HCT 24.0* 06/03/2013   MCV 96.8 06/03/2013   PLT 53* 06/03/2013  Chemistry      Component Value Date/Time   NA 133* 05/29/2013 1501   NA 138 05/22/2013 0530   K 3.9 05/29/2013 1501   K 4.1 05/22/2013 0530   CL 106 05/22/2013 0530   CO2 23 05/29/2013 1501   CO2 25 05/22/2013 0530   BUN 5.9* 05/29/2013 1501   BUN 10 05/22/2013 0530   CREATININE 0.7 05/29/2013 1501   CREATININE 0.51 05/22/2013 0530   CREATININE 0.83 04/23/2013 1710      Component Value Date/Time   CALCIUM 8.4 05/29/2013 1501   CALCIUM 6.9* 05/22/2013 0530   CALCIUM 8.0* 05/14/2013 0956   ALKPHOS 323* 05/29/2013 1501   ALKPHOS 214* 05/22/2013 0530   AST 18 05/29/2013 1501   AST 35 05/22/2013 0530   ALT 13 05/29/2013 1501   ALT 19 05/22/2013 0530   BILITOT 0.63 05/29/2013 1501   BILITOT 0.4 05/22/2013 0530       Radiological Studies:  CLINICAL DATA: Initial treatment strategy for lymphoma.  EXAM:  NUCLEAR MEDICINE PET SKULL BASE TO THIGH  TECHNIQUE:  10.0 mCi F-18 FDG was injected intravenously. Full-ring PET imaging  was performed from the skull base to thigh after the  radiotracer. CT  data was obtained and used for attenuation correction and anatomic  localization.  FASTING BLOOD GLUCOSE: Value: 88 mg/dl  COMPARISON: CT scan 05/10/2013  FINDINGS:  NECK  No hypermetabolic lymph nodes in the neck.  CHEST  No hypermetabolic mediastinal or hilar nodes. No suspicious  pulmonary nodules on the CT scan.  ABDOMEN/PELVIS  Interval development of diffuse mesenteric edema with free abdominal  and pelvic fluid. The small bowel appears thickened. There are  scattered mesenteric and retroperitoneal lymph nodes. There is  diffuse and fairly marked FDG uptake in the small bowel, mesenteric  and omentum. This could reflect diffuse lymphoma but I think it is  much more likely severe enteritis or possibly drug reaction from  chemotherapy. The large nodal mass in the right pelvis is markedly  hypermetabolic with SUV max of 33.2.  There may be small metabolically active liver lesions but this is  equivocal. I cannot really correlate these areas with the abnormal  MRI.  SKELETON  Diffuse abnormal nodular increased uptake in the osseous structures  concerning for osseous lymphoma.  IMPRESSION:  1. Large right pelvic nodal mass is FDG positive and consistent with  known lymphoma.  2. Diffuse uptake in the mesentery and small bowel could be due to  diffuse lymphoma involvement but is more likely a severe enteritis  or drug reaction.  3. Diffuse abnormal FDG uptake in bones is suspicious diffuse  lymphomatous involvement.  Electronically Signed  By: Kalman Jewels M.D.  On: 05/16/2013 14:27   *[place]* *[place] [employer] [place], [place] [address] [phone]  ------------------------------------------------------------ Transthoracic Echocardiography  Patient: Sebastyan, Snodgrass MR #: [id number] Study Date: 05/17/2013 Gender: M Age: 65 Height: 162.6cm Weight: 38.2kg BSA: 1.50m^2 Pt. Status: Room: [facility] REFERRING Magrinat, Hazle Nordmann ADMITTING Irine Seal SONOGRAPHER Jimmy Reel, RDCS PERFORMING [facility], Inpatient cc:  ------------------------------------------------------------ LV EF: 60% - 65%  ------------------------------------------------------------ Indications: V58.11 Chemotherapy Evaluation.  ------------------------------------------------------------ Study Conclusions  Left ventricle: The cavity size was normal. Wall thickness was increased in a pattern of mild LVH. There was focal basal hypertrophy. Systolic function was normal. The estimated ejection fraction was in the range of 60% to 65%. Transthoracic echocardiography. M-mode, limited 2D, limited spectral Doppler, and color  Doppler. Height: Height: 162.6cm. Height: 64in. Weight: Weight: 38.2kg. Weight: 84.1lb. Body mass index: BMI: 14.5kg/m^2. Body surface area: BSA: 1.36m^2. Blood pressure: 121/70. Patient status: Inpatient. Location: Bedside.  ------------------------------------------------------------  ------------------------------------------------------------ Left ventricle: The global longitudinal strain is reduced at -15.6 % but I do not think this is accurate. The LV endocardium is seen fairly well with the echo contrast. The EF can be seen to be normal. The cavity size was normal. Wall thickness was increased in a pattern of mild LVH. There was focal basal hypertrophy. Systolic function was normal. The estimated ejection fraction was in the range of 60% to 65%.  ------------------------------------------------------------ Aorta: Aortic root: The aortic root was normal in size. Ascending aorta: The ascending aorta was normal in size.  ------------------------------------------------------------ Left atrium: The atrium was normal in size.  ------------------------------------------------------------ Right ventricle: The cavity size was  normal. Systolic function was normal.  ------------------------------------------------------------ Pericardium: There was no pericardial effusion.  ------------------------------------------------------------  2D measurements Normal Doppler measurements Normal Left ventricle Left ventricle LVID ED, 42.5 mm 43-52 Ea, lat ann, 9.1 cm/s ------ chord, tiss DP PLAX Ea, med ann, 6.9 cm/s ------ LVID ES, 30.8 mm 23-38 tiss DP chord, Systemic veins PLAX Estimated 3 mm ------ FS, chord, 28 % >29 CVP Hg PLAX LVPW, ED 12.2 mm ------ IVS/LVPW 0.94 <1.3 ratio, ED Ventricular septum IVS, ED 11.5 mm ------ Aorta Root diam, 34 mm ------ ED Left atrium AP dim 33 mm ------ AP dim 2.44 cm/m^2 <2.2 index  ------------------------------------------------------------ Prepared and Electronically Authenticated by  Mertie Moores 2015-04-10T17:02:34.730  Assessment and Plan: [name] 58 y.o. male with  1. High grade b cell NHL.  The patient is day 18 of his first cycle of RCHOP chemotherapy.  Symptomatically he is feeling much improved and he does look much better than he did last week.  Due to the fact that he has Burkitts and a poor prognosis we have referred him to [facility] and are awaiting an appointment.  His labs continue to improve, I reviewed them with him in detail.    2. Cardiac:  Patient underwent a pre-chemotherapy echocardiogram on 05/17/13 that demonstrated a LVEF of 60-65%.    3. PICC Line: Patient is seen in our flush room for PICC line flushes on Monday, Wednesday and Friday, and has his dressing changed weekly.  There are no signs of infection.  He is tolerating this well.    4. Anemia:  His hemoglobin is 7.9.  He is asymptomatic.  We will continue to monitor.    The patient will return on 06/10/13 for labs and evaluation.  He and his wife know to call us in the interim for any questions or concerns.  We can certainly see him sooner if needed.  I spent 25 minutes counseling  the patient face to face.  The total time spent in the appointment was 30 minutes.  Minette Headland, [place] [phone] 06/04/2013 11:01 PM   ADDENDUM: I met with Atzin and his wife and we went over his diagnosis, prognosis and treatment plan. He understands his lymphomy types as a Burkitt's and this is a very aggressive but also curable tumor. He has responded to CHOP-R (counts are improving, electrolytes have normalized) but that will not be curative and he will likely need  Hyper-CVAD or a similar regimen. While we can certainly do that here, I usually refer patients like him to a tertiary car center where they have more experience with these tumors. We  have made him an appt with dr Gerald Dexter at [facility] and I would defer further treatment to his discretion.  There are some caregiver issues we also discussed today. He and Stanton Kidney have been married >30 years and their interaction patterns are unlikely to suddenly improve in face of this crisis--the opposite may be more common. I urged them both to be patient with each other and I do believe once they have a definitive plan and start putting it nto practice the problems will seem more manageable and they can help each other get through the treatments.  We will continue to provide supportive care pending a definitive treatment plan.  I personally saw this patient and performed a substantive portion of this encounter with the listed APP documented above.   Chauncey Cruel, MD

## 2013-06-03 NOTE — Telephone Encounter (Signed)
per pof sch appt-printed and gave pt copy of sch

## 2013-06-03 NOTE — Patient Instructions (Signed)
PICC Home Guide A peripherally inserted central catheter (PICC) is a long, thin, flexible tube that is inserted into a vein in the upper arm. It is a form of intravenous (IV) access. It is considered to be a "central" line because the tip of the PICC ends in a large vein in your chest. This large vein is called the superior vena cava (SVC). The PICC tip ends in the SVC because there is a lot of blood flow in the SVC. This allows medicines and IV fluids to be quickly distributed throughout the body. The PICC is inserted using a sterile technique by a specially trained nurse or physician. After the PICC is inserted, a chest X-ray exam is done to be sure it is in the correct place.  A PICC may be placed for different reasons, such as:  To give medicines and liquid nutrition that can only be given through a central line. Examples are:  Certain antibiotic treatments.  Chemotherapy.  Total parenteral nutrition (TPN).  To take frequent blood samples.  To give IV fluids and blood products.  If there is difficulty placing a peripheral intravenous (PIV) catheter. If taken care of properly, a PICC can remain in place for several months. A PICC can also allow a person to go home from the hospital early. Medicine and PICC care can be managed at home by a family member or home health care team. WHAT PROBLEMS CAN HAPPEN WHEN I HAVE A PICC? Problems with a PICC can occasionally occur. These may include:  A blood clot (thrombus) forming in or at the tip of the PICC. This can cause the PICC to become clogged. A clot-dissolving medicine called tissue plasminogen activator (tPA) can be given through the PICC to help break up the clot.  Inflammation of the vein (phlebitis) in which the PICC is placed. Signs of inflammation may include redness, pain at the insertion site, red streaks, or being able to feel a "cord" in the vein where the PICC is located.  Infection in the PICC or at the insertion site. Signs of  infection may include fever, chills, redness, swelling, or pus drainage from the PICC insertion site.  PICC movement (malposition). The PICC tip may move from its original position due to excessive physical activity, forceful coughing, sneezing, or vomiting.  A break or cut in the PICC. It is important to not use scissors near the PICC.  Nerve or tendon irritation or injury during PICC insertion. WHAT SHOULD I KEEP IN MIND ABOUT ACTIVITIES WHEN I HAVE A PICC?  You may bend your arm and move it freely. If your PICC is near or at the bend of your elbow, avoid activity with repeated motion at the elbow.  Rest at home for the remainder of the day following PICC line insertion.  Avoid lifting heavy objects as instructed by your health care provider.  Avoid using a crutch with the arm on the same side as your PICC. You may need to use a walker. WHAT SHOULD I KNOW ABOUT MY PICC DRESSING?  Keep your PICC bandage (dressing) clean and dry to prevent infection.  Ask your health care provider when you may shower. Ask your health care provider to teach you how to wrap the PICC when you do take a shower.  Change the PICC dressing as instructed by your health care provider.  Change your PICC dressing if it becomes loose or wet. WHAT SHOULD I KNOW ABOUT PICC CARE?  Check the PICC insertion site daily for   leakage, redness, swelling, or pain.  Do not take a bath, swim, or use hot tubs when you have a PICC. Cover PICC line with clear plastic wrap and tape to keep it dry while showering.  Flush the PICC as directed by your health care provider. Let your health care provider know right away if the PICC is difficult to flush or does not flush. Do not use force to flush the PICC.  Do not use a syringe that is less than 10 mL to flush the PICC.  Never pull or tug on the PICC.  Avoid blood pressure checks on the arm with the PICC.  Keep your PICC identification card with you at all times.  Do not  take the PICC out yourself. Only a trained clinical professional should remove the PICC. SEEK IMMEDIATE MEDICAL CARE IF:  Your PICC is accidently pulled all the way out. If this happens, cover the insertion site with a bandage or gauze dressing. Do not throw the PICC away. Your health care provider will need to inspect it.  Your PICC was tugged or pulled and has partially come out. Do not  push the PICC back in.  There is any type of drainage, redness, or swelling where the PICC enters the skin.  You cannot flush the PICC, it is difficult to flush, or the PICC leaks around the insertion site when it is flushed.  You hear a "flushing" sound when the PICC is flushed.  You have pain, discomfort, or numbness in your arm, shoulder, or jaw on the same side as the PICC.  You feel your heart "racing" or skipping beats.  You notice a hole or tear in the PICC.  You develop chills or a fever. MAKE SURE YOU:   Understand these instructions.  Will watch your condition.  Will get help right away if you are not doing well or get worse. Document Released: 07/31/2002 Document Revised: 11/14/2012 Document Reviewed: 10/01/2012 [employer] Patient Information 2014 [employer].  

## 2013-06-03 NOTE — Telephone Encounter (Signed)
Open in error

## 2013-06-03 NOTE — Progress Notes (Signed)
Per Mendel Ryder, NP, I called to Dr. Steva Colder office at [facility] [phone]) and spoke with Harrell Gave. Please fax all medical records to [phone] attention [place]. She will start making a chart for the patient. Please fax demographic sheet, a copy of insurance card, medical records, office notes, pathology reports, the referral, labs and scans. This request was given to [place] in Medical records.

## 2013-06-03 NOTE — Telephone Encounter (Signed)
MEDICAL RECORDS FAXED TO ATTN: [place] @ [phone]

## 2013-06-05 ENCOUNTER — Ambulatory Visit (HOSPITAL_BASED_OUTPATIENT_CLINIC_OR_DEPARTMENT_OTHER): Payer: No Typology Code available for payment source

## 2013-06-05 ENCOUNTER — Encounter (HOSPITAL_COMMUNITY): Payer: Self-pay

## 2013-06-05 ENCOUNTER — Encounter: Payer: Self-pay | Admitting: Oncology

## 2013-06-05 VITALS — BP 95/72 | HR 93

## 2013-06-05 DIAGNOSIS — Z452 Encounter for adjustment and management of vascular access device: Secondary | ICD-10-CM

## 2013-06-05 DIAGNOSIS — C8379 Burkitt lymphoma, extranodal and solid organ sites: Secondary | ICD-10-CM

## 2013-06-05 MED ORDER — HEPARIN SOD (PORK) LOCK FLUSH 100 UNIT/ML IV SOLN
500.0000 [IU] | Freq: Once | INTRAVENOUS | Status: AC
  Administered 2013-06-05: 250 [IU] via INTRAVENOUS
  Filled 2013-06-05: qty 5

## 2013-06-05 MED ORDER — SODIUM CHLORIDE 0.9 % IJ SOLN
10.0000 mL | INTRAMUSCULAR | Status: DC | PRN
  Administered 2013-06-05: 10 mL via INTRAVENOUS
  Filled 2013-06-05: qty 10

## 2013-06-05 NOTE — Progress Notes (Signed)
Enrolled pt in the [employer] program.  I faxed signed form and activated card today.  °

## 2013-06-06 ENCOUNTER — Telehealth: Payer: Self-pay | Admitting: *Deleted

## 2013-06-07 ENCOUNTER — Other Ambulatory Visit: Payer: Self-pay | Admitting: *Deleted

## 2013-06-07 ENCOUNTER — Ambulatory Visit (HOSPITAL_BASED_OUTPATIENT_CLINIC_OR_DEPARTMENT_OTHER): Payer: No Typology Code available for payment source

## 2013-06-07 ENCOUNTER — Other Ambulatory Visit: Payer: Self-pay

## 2013-06-07 VITALS — BP 125/77 | HR 109

## 2013-06-07 DIAGNOSIS — C8590 Non-Hodgkin lymphoma, unspecified, unspecified site: Secondary | ICD-10-CM

## 2013-06-07 DIAGNOSIS — Z452 Encounter for adjustment and management of vascular access device: Secondary | ICD-10-CM

## 2013-06-07 DIAGNOSIS — C8589 Other specified types of non-Hodgkin lymphoma, extranodal and solid organ sites: Secondary | ICD-10-CM

## 2013-06-07 MED ORDER — HEPARIN SOD (PORK) LOCK FLUSH 100 UNIT/ML IV SOLN
500.0000 [IU] | Freq: Once | INTRAVENOUS | Status: AC
  Administered 2013-06-07: 250 [IU] via INTRAVENOUS
  Filled 2013-06-07: qty 5

## 2013-06-07 MED ORDER — SODIUM CHLORIDE 0.9 % IJ SOLN
10.0000 mL | INTRAMUSCULAR | Status: DC | PRN
  Administered 2013-06-07: 10 mL via INTRAVENOUS
  Filled 2013-06-07: qty 10

## 2013-06-10 ENCOUNTER — Other Ambulatory Visit: Payer: No Typology Code available for payment source

## 2013-06-10 ENCOUNTER — Ambulatory Visit: Payer: No Typology Code available for payment source

## 2013-06-10 ENCOUNTER — Other Ambulatory Visit (HOSPITAL_BASED_OUTPATIENT_CLINIC_OR_DEPARTMENT_OTHER): Payer: No Typology Code available for payment source

## 2013-06-10 ENCOUNTER — Ambulatory Visit (HOSPITAL_BASED_OUTPATIENT_CLINIC_OR_DEPARTMENT_OTHER): Payer: No Typology Code available for payment source | Admitting: Oncology

## 2013-06-10 VITALS — BP 116/77 | HR 121 | Temp 98.3°F | Resp 20 | Ht 64.0 in | Wt 172.5 lb

## 2013-06-10 VITALS — BP 137/76 | HR 116 | Temp 97.2°F

## 2013-06-10 DIAGNOSIS — C8370 Burkitt lymphoma, unspecified site: Secondary | ICD-10-CM

## 2013-06-10 DIAGNOSIS — C8379 Burkitt lymphoma, extranodal and solid organ sites: Secondary | ICD-10-CM

## 2013-06-10 DIAGNOSIS — Z452 Encounter for adjustment and management of vascular access device: Secondary | ICD-10-CM

## 2013-06-10 DIAGNOSIS — I951 Orthostatic hypotension: Secondary | ICD-10-CM

## 2013-06-10 DIAGNOSIS — Z9189 Other specified personal risk factors, not elsewhere classified: Secondary | ICD-10-CM

## 2013-06-10 DIAGNOSIS — C8590 Non-Hodgkin lymphoma, unspecified, unspecified site: Secondary | ICD-10-CM

## 2013-06-10 DIAGNOSIS — D696 Thrombocytopenia, unspecified: Secondary | ICD-10-CM

## 2013-06-10 DIAGNOSIS — M109 Gout, unspecified: Secondary | ICD-10-CM

## 2013-06-10 DIAGNOSIS — D649 Anemia, unspecified: Secondary | ICD-10-CM

## 2013-06-10 DIAGNOSIS — K5900 Constipation, unspecified: Secondary | ICD-10-CM

## 2013-06-10 DIAGNOSIS — E1149 Type 2 diabetes mellitus with other diabetic neurological complication: Secondary | ICD-10-CM

## 2013-06-10 LAB — CBC WITH DIFFERENTIAL/PLATELET
BASO%: 2.3 % — ABNORMAL HIGH (ref 0.0–2.0)
BASOS ABS: 0.3 10*3/uL — AB (ref 0.0–0.1)
EOS ABS: 0.2 10*3/uL (ref 0.0–0.5)
EOS%: 1.3 % (ref 0.0–7.0)
HCT: 24.3 % — ABNORMAL LOW (ref 38.4–49.9)
HEMOGLOBIN: 7.7 g/dL — AB (ref 13.0–17.1)
LYMPH#: 2.1 10*3/uL (ref 0.9–3.3)
LYMPH%: 17.1 % (ref 14.0–49.0)
MCH: 31.2 pg (ref 27.2–33.4)
MCHC: 31.7 g/dL — ABNORMAL LOW (ref 32.0–36.0)
MCV: 98.4 fL — ABNORMAL HIGH (ref 79.3–98.0)
MONO#: 1.4 10*3/uL — ABNORMAL HIGH (ref 0.1–0.9)
MONO%: 11.9 % (ref 0.0–14.0)
NEUT%: 67.4 % (ref 39.0–75.0)
NEUTROS ABS: 8.1 10*3/uL — AB (ref 1.5–6.5)
Platelets: 71 10*3/uL — ABNORMAL LOW (ref 140–400)
RBC: 2.47 10*6/uL — AB (ref 4.20–5.82)
RDW: 16.5 % — AB (ref 11.0–14.6)
WBC: 12 10*3/uL — ABNORMAL HIGH (ref 4.0–10.3)

## 2013-06-10 LAB — COMPREHENSIVE METABOLIC PANEL (CC13)
ALBUMIN: 2.7 g/dL — AB (ref 3.5–5.0)
ALK PHOS: 372 U/L — AB (ref 40–150)
ALT: <6 U/L (ref 0–55)
AST: 19 U/L (ref 5–34)
Anion Gap: 10 mEq/L (ref 3–11)
BUN: 6.3 mg/dL — ABNORMAL LOW (ref 7.0–26.0)
CHLORIDE: 100 meq/L (ref 98–109)
CO2: 27 mEq/L (ref 22–29)
Calcium: 9.1 mg/dL (ref 8.4–10.4)
Creatinine: 0.7 mg/dL (ref 0.7–1.3)
GLUCOSE: 171 mg/dL — AB (ref 70–140)
POTASSIUM: 3.3 meq/L — AB (ref 3.5–5.1)
Sodium: 137 mEq/L (ref 136–145)
Total Bilirubin: 0.6 mg/dL (ref 0.20–1.20)
Total Protein: 5.7 g/dL — ABNORMAL LOW (ref 6.4–8.3)

## 2013-06-10 MED ORDER — COLCHICINE 0.6 MG PO TABS: 0.6000 mg | ORAL_TABLET | Freq: Every day | ORAL | Status: AC

## 2013-06-10 MED ORDER — HEPARIN SOD (PORK) LOCK FLUSH 100 UNIT/ML IV SOLN
500.0000 [IU] | Freq: Once | INTRAVENOUS | Status: AC
  Administered 2013-06-10: 500 [IU] via INTRAVENOUS
  Filled 2013-06-10: qty 5

## 2013-06-10 MED ORDER — SODIUM CHLORIDE 0.9 % IJ SOLN
10.0000 mL | INTRAMUSCULAR | Status: DC | PRN
  Administered 2013-06-10: 10 mL via INTRAVENOUS
  Filled 2013-06-10: qty 10

## 2013-06-10 NOTE — Addendum Note (Signed)
Addended by: Laureen Abrahams on: 06/10/2013 05:24 PM   Modules accepted: Orders

## 2013-06-10 NOTE — Patient Instructions (Signed)
PICC Home Guide A peripherally inserted central catheter (PICC) is a long, thin, flexible tube that is inserted into a vein in the upper arm. It is a form of intravenous (IV) access. It is considered to be a "central" line because the tip of the PICC ends in a large vein in your chest. This large vein is called the superior vena cava (SVC). The PICC tip ends in the SVC because there is a lot of blood flow in the SVC. This allows medicines and IV fluids to be quickly distributed throughout the body. The PICC is inserted using a sterile technique by a specially trained nurse or physician. After the PICC is inserted, a chest X-ray exam is done to be sure it is in the correct place.  A PICC may be placed for different reasons, such as:  To give medicines and liquid nutrition that can only be given through a central line. Examples are:  Certain antibiotic treatments.  Chemotherapy.  Total parenteral nutrition (TPN).  To take frequent blood samples.  To give IV fluids and blood products.  If there is difficulty placing a peripheral intravenous (PIV) catheter. If taken care of properly, a PICC can remain in place for several months. A PICC can also allow a person to go home from the hospital early. Medicine and PICC care can be managed at home by a family member or home health care team. WHAT PROBLEMS CAN HAPPEN WHEN I HAVE A PICC? Problems with a PICC can occasionally occur. These may include:  A blood clot (thrombus) forming in or at the tip of the PICC. This can cause the PICC to become clogged. A clot-dissolving medicine called tissue plasminogen activator (tPA) can be given through the PICC to help break up the clot.  Inflammation of the vein (phlebitis) in which the PICC is placed. Signs of inflammation may include redness, pain at the insertion site, red streaks, or being able to feel a "cord" in the vein where the PICC is located.  Infection in the PICC or at the insertion site. Signs of  infection may include fever, chills, redness, swelling, or pus drainage from the PICC insertion site.  PICC movement (malposition). The PICC tip may move from its original position due to excessive physical activity, forceful coughing, sneezing, or vomiting.  A break or cut in the PICC. It is important to not use scissors near the PICC.  Nerve or tendon irritation or injury during PICC insertion. WHAT SHOULD I KEEP IN MIND ABOUT ACTIVITIES WHEN I HAVE A PICC?  You may bend your arm and move it freely. If your PICC is near or at the bend of your elbow, avoid activity with repeated motion at the elbow.  Rest at home for the remainder of the day following PICC line insertion.  Avoid lifting heavy objects as instructed by your health care provider.  Avoid using a crutch with the arm on the same side as your PICC. You may need to use a walker. WHAT SHOULD I KNOW ABOUT MY PICC DRESSING?  Keep your PICC bandage (dressing) clean and dry to prevent infection.  Ask your health care provider when you may shower. Ask your health care provider to teach you how to wrap the PICC when you do take a shower.  Change the PICC dressing as instructed by your health care provider.  Change your PICC dressing if it becomes loose or wet. WHAT SHOULD I KNOW ABOUT PICC CARE?  Check the PICC insertion site daily for   leakage, redness, swelling, or pain.  Do not take a bath, swim, or use hot tubs when you have a PICC. Cover PICC line with clear plastic wrap and tape to keep it dry while showering.  Flush the PICC as directed by your health care provider. Let your health care provider know right away if the PICC is difficult to flush or does not flush. Do not use force to flush the PICC.  Do not use a syringe that is less than 10 mL to flush the PICC.  Never pull or tug on the PICC.  Avoid blood pressure checks on the arm with the PICC.  Keep your PICC identification card with you at all times.  Do not  take the PICC out yourself. Only a trained clinical professional should remove the PICC. SEEK IMMEDIATE MEDICAL CARE IF:  Your PICC is accidently pulled all the way out. If this happens, cover the insertion site with a bandage or gauze dressing. Do not throw the PICC away. Your health care provider will need to inspect it.  Your PICC was tugged or pulled and has partially come out. Do not  push the PICC back in.  There is any type of drainage, redness, or swelling where the PICC enters the skin.  You cannot flush the PICC, it is difficult to flush, or the PICC leaks around the insertion site when it is flushed.  You hear a "flushing" sound when the PICC is flushed.  You have pain, discomfort, or numbness in your arm, shoulder, or jaw on the same side as the PICC.  You feel your heart "racing" or skipping beats.  You notice a hole or tear in the PICC.  You develop chills or a fever. MAKE SURE YOU:   Understand these instructions.  Will watch your condition.  Will get help right away if you are not doing well or get worse. Document Released: 07/31/2002 Document Revised: 11/14/2012 Document Reviewed: 10/01/2012 [employer] Patient Information 2014 [employer].  

## 2013-06-10 NOTE — Progress Notes (Signed)
Hematology and Oncology Follow Up Visit  [name] [name] [id number] 1955-04-11 58 y.o. 06/10/2013 4:40 PM     Principal Diagnosis:[name] [name] 58 y.o. male with Burkitt's lymphoma.   History of Non-Hodgkin's Lymphoma:  The patient is a 58 year old male with h/o hypertension, PUD, GERD, DM-2, hyperlipidemia who underwent an extensive chest pain work up during a previous hospitalization from 04/23/13 through 04/25/13 that was negative for a cardiac etiology and for a PE.  He presented for an upper endoscopy for a GI eval of his chest pain, where labs demonstrated a sodium level of 116, a potassium of 5.4, a calcium of 11.1, and plts of 80k, and elevated LFT.  He was sent from the endoscopy suite to the ER on 05/06/13. .  During his hospitalization his sodium was corrected, he underwent an EGD that only showed gastritis, and no cause of his pain.  During his work up he was noted to have abnormal LFTs leading to an abdominal ultrasound, followed by an abdominal MRI, then a CT of the abdomen.  The patient who admits to previously drinking up to 72 ounces of beer per day, and the liver lesions seen on ultrasound appear to be regenerating nodules, as he may have been developing cirrhosis.  However, the CT of the abdomen and pelvis demonstrated an unexpected lymphoid mass in the right iliac area.  Hematology/oncology was consulted for evaluation of the patient's thrombocytopenia.  His platelets were normal on 05/01/13, 84 on 05/06/13, and in the 60s beginning on 05/08/13.  LDH was 3192 on 05/06/2013.  The patient also had hypercalcemia with a calcium level around 11 during his hospitalization.  He did receive Pamidronate for his hypocalcemia on 05/12/13. 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  Immunohistochemistry revealed cells that were positive for CD20, CD79a, CD10, bcl-6, and bcl-2.  Bone marrow biopsy on 05/15/13 demonstrated a  hypercellular marrow with high grade lymphoma involvement, however flow was negative and attributed to necrosis.  PET scan on 05/16/13 demonstrated a large right pelvic nodal mass with a SUV of 16.6, diffuse uptake in the mesentary and small bowe, possibly due to lymphoma, but more likely a severe enteritis or drug reaction, and diffuse abnormal uptake in the bones suspicious for diffuse lymphomatous involvement. Due to these results the patient received R-CHOP on 05/17/13, with extensive hydration and rasburicase.  FISH results demonstrated BCL2 translocation and MYC translocation consistent with a poor prognosis.    Current therapy:  RCHOP cycle 1 day 25  Interim History: [name] [name] returns today with his wife [name] [name] for followup of his non-Hodgkin's lymphoma. The left today so been significant only for his development of gout in the right foot, although he is on allopurinol. He has had multiple gout attacks in the past. He also has an appointment with Dr[facility]Diehl at [facility] 06/13/2048  Review of Systems: Aside from the gout issues, he had significant constipation last week and it was painful to pass a hard stools. He has some hemorrhoidal bleeding. He has been started on a bowel prophylaxis regimen with 2 stool softeners a day and MiraLAX daily and he is now back to normal, with soft regular bowel movements. He denies fevers, drenching sweats, rash, or other source of bleeding. He feels tired, but not more so than before. Of course some of that would be due to his anemia. A detailed review of systems today was otherwise stable  Medications:  Current Outpatient  Prescriptions  Medication Sig Dispense Refill  . allopurinol (ZYLOPRIM) 300 MG tablet Take 1 tablet (300 mg total) by mouth daily.  30 tablet  4  . bisacodyl (DULCOLAX) 5 MG EC tablet Take 1 tablet (5 mg total) by mouth daily as needed for moderate constipation.  30 tablet  0  . colchicine 0.6 MG tablet Take 1 tablet (0.6 mg total) by mouth daily.  12 tablet   0  . esomeprazole (NEXIUM) 40 MG packet Take 40 mg by mouth daily before breakfast.  30 each  3  . fluconazole (DIFLUCAN) 100 MG tablet Take 1 tablet (100 mg total) by mouth daily.  10 tablet  0  . HYDROcodone-acetaminophen (NORCO) 10-325 MG per tablet       . lisinopril (PRINIVIL,ZESTRIL) 40 MG tablet TAKE 1 TABLET BY MOUTH DAILY. PLEASE RESCHEDULE YOUR 6 MONTH FOLLOW UP APPOINTMENT FOR ADDITIONAL REFILLS.  90 tablet  1  . metoCLOPramide (REGLAN) 5 MG tablet Take 1 tablet (5 mg total) by mouth 3 (three) times daily before meals.  90 tablet  4  . metoprolol succinate (TOPROL-XL) 100 MG 24 hr tablet Take 100 mg by mouth daily. Take with or immediately following a meal.      . oxyCODONE-acetaminophen (PERCOCET/ROXICET) 5-325 MG per tablet Take 1-2 tablets by mouth every 3 (three) hours as needed for severe pain.  65 tablet  0  . polyethylene glycol (MIRALAX / GLYCOLAX) packet Take 17 g by mouth daily.  14 each  0  . venlafaxine XR (EFFEXOR-XR) 37.5 MG 24 hr capsule Take 1 capsule (37.5 mg total) by mouth daily with breakfast.  30 capsule  4   No current facility-administered medications for this visit.     Allergies:  Allergies  Allergen Reactions  . Penicillins Hives    Medical History: Past Medical History  Diagnosis Date  . GERD (gastroesophageal reflux disease)   . Hypertension   . Peptic ulcer   . Gout   . Hiatal hernia   . Diabetes mellitus without complication   . Hyperlipidemia   . Non Hodgkin's lymphoma     Surgical History:  Past Surgical History  Procedure Laterality Date  . Pilonidal cyst / sinus excision    . 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 Tonsillectomy    . Colonoscopy    . Esophagogastroduodenoscopy N/A 05/11/2013    Procedure: ESOPHAGOGASTRODUODENOSCOPY (EGD);  Surgeon: Milus Banister, MD;  Location: [facility];  Service: Endoscopy;  Laterality: N/A;     Physical Exam: Middle-aged white male using a walker  Blood pressure 116/77, pulse 121,  temperature 98.3 F (36.8 C), temperature source Oral, resp. rate 20, height _0  (1.626 m), weight 172 lb 8 oz (78.245 kg).  ECOG PERFORMANCE STATUS: 1 - Symptomatic but completely ambulatory  Sclerae unicteric, pupils equal and reactive Oropharynx clear and moist--thrush has resolved No cervical or supraclavicular adenopathy, no axillary adenopathy Lungs no rales or rhonchi, good excursion bilaterally Heart regular rate and rhythm Abd soft, obese, nontender, positive bowel sounds MSK no focal spinal tenderness, no upper extremity lymphedema, fourth digit left hand missing, chronic Neuro: nonfocal, well oriented, anxious affect    Lab Results: Lab Results  Component Value Date   WBC 12.0* 06/10/2013   HGB 7.7* 06/10/2013   HCT 24.3* 06/10/2013   MCV 98.4* 06/10/2013   PLT 71* 06/10/2013     Chemistry      Component Value Date/Time   NA 137 06/10/2013 1511  NA 138 05/22/2013 0530   K 3.3* 06/10/2013 1511   K 4.1 05/22/2013 0530   CL 106 05/22/2013 0530   CO2 27 06/10/2013 1511   CO2 25 05/22/2013 0530   BUN 6.3* 06/10/2013 1511   BUN 10 05/22/2013 0530   CREATININE 0.7 06/10/2013 1511   CREATININE 0.51 05/22/2013 0530   CREATININE 0.83 04/23/2013 1710      Component Value Date/Time   CALCIUM 9.1 06/10/2013 1511   CALCIUM 6.9* 05/22/2013 0530   CALCIUM 8.0* 05/14/2013 0956   ALKPHOS 372* 06/10/2013 1511   ALKPHOS 214* 05/22/2013 0530   AST 19 06/10/2013 1511   AST 35 05/22/2013 0530   ALT <6 06/10/2013 1511   ALT 19 05/22/2013 0530   BILITOT 0.60 06/10/2013 1511   BILITOT 0.4 05/22/2013 0530       Radiological Studies:  CLINICAL DATA: Initial treatment strategy for lymphoma.  EXAM:  NUCLEAR MEDICINE PET SKULL BASE TO THIGH  TECHNIQUE:  10.0 mCi F-18 FDG was injected intravenously. Full-ring PET imaging  was performed from the skull base to thigh after the radiotracer. CT  data was obtained and used for attenuation correction and anatomic  localization.  FASTING BLOOD GLUCOSE: Value: 88 mg/dl   COMPARISON: CT scan 05/10/2013  FINDINGS:  NECK  No hypermetabolic lymph nodes in the neck.  CHEST  No hypermetabolic mediastinal or hilar nodes. No suspicious  pulmonary nodules on the CT scan.  ABDOMEN/PELVIS  Interval development of diffuse mesenteric edema with free abdominal  and pelvic fluid. The small bowel appears thickened. There are  scattered mesenteric and retroperitoneal lymph nodes. There is  diffuse and fairly marked FDG uptake in the small bowel, mesenteric  and omentum. This could reflect diffuse lymphoma but I think it is  much more likely severe enteritis or possibly drug reaction from  chemotherapy. The large nodal mass in the right pelvis is markedly  hypermetabolic with SUV max of 62.3.  There may be small metabolically active liver lesions but this is  equivocal. I cannot really correlate these areas with the abnormal  MRI.  SKELETON  Diffuse abnormal nodular increased uptake in the osseous structures  concerning for osseous lymphoma.  IMPRESSION:  1. Large right pelvic nodal mass is FDG positive and consistent with  known lymphoma.  2. Diffuse uptake in the mesentery and small bowel could be due to  diffuse lymphoma involvement but is more likely a severe enteritis  or drug reaction.  3. Diffuse abnormal FDG uptake in bones is suspicious diffuse  lymphomatous involvement.  Electronically Signed  By: Kalman Jewels M.D.  On: 05/16/2013 14:27   *[facility]* *[address] [employer] [address] [place], [place] [address] [phone]  ------------------------------------------------------------ Transthoracic Echocardiography  Patient: Ashleigh, Arya MR #: [id number] Study Date: 05/17/2013 Gender: M Age: 58 Height: 162.6cm Weight: 38.2kg BSA: 1.77m2 Pt. Status: Room: 1[place]REFERRING Nonie Lochner, GHazle NordmannADMITTING TIrine SealSONOGRAPHER Jimmy Reel,  RDCS PERFORMING [facility], Inpatient cc:  ------------------------------------------------------------ LV EF: 60% - 65%  ------------------------------------------------------------ Indications: V58.11 Chemotherapy Evaluation.  ------------------------------------------------------------ Study Conclusions  Left ventricle: The cavity size was normal. Wall thickness was increased in a pattern of mild LVH. There was focal basal hypertrophy. Systolic function was normal. The estimated ejection fraction was in the range of 60% to 65%. Transthoracic echocardiography. M-mode, limited 2D, limited spectral Doppler, and color Doppler. Height: Height: 162.6cm. Height: 64in. Weight: Weight: 38.2kg. Weight: 84.1lb. Body mass index: BMI: 14.5kg/m^2. Body  surface area: BSA: 1.58m2. Blood pressure: 121/70. Patient status: Inpatient. Location: Bedside.  ------------------------------------------------------------  ------------------------------------------------------------ Left ventricle: The global longitudinal strain is reduced at -15.6 % but I do not think this is accurate. The LV endocardium is seen fairly well with the echo contrast. The EF can be seen to be normal. The cavity size was normal. Wall thickness was increased in a pattern of mild LVH. There was focal basal hypertrophy. Systolic function was normal. The estimated ejection fraction was in the range of 60% to 65%.  ------------------------------------------------------------ Aorta: Aortic root: The aortic root was normal in size. Ascending aorta: The ascending aorta was normal in size.  ------------------------------------------------------------ Left atrium: The atrium was normal in size.  ------------------------------------------------------------ Right ventricle: The cavity size was normal. Systolic function was normal.  ------------------------------------------------------------ Pericardium: There was no pericardial  effusion.  ------------------------------------------------------------  2D measurements Normal Doppler measurements Normal Left ventricle Left ventricle LVID ED, 42.5 mm 43-52 Ea, lat ann, 9.1 cm/s ------ chord, tiss DP PLAX Ea, med ann, 6.9 cm/s ------ LVID ES, 30.8 mm 23-38 tiss DP chord, Systemic veins PLAX Estimated 3 mm ------ FS, chord, 28 % >29 CVP Hg PLAX LVPW, ED 12.2 mm ------ IVS/LVPW 0.94 <1.3 ratio, ED Ventricular septum IVS, ED 11.5 mm ------ Aorta Root diam, 34 mm ------ ED Left atrium AP dim 33 mm ------ AP dim 2.44 cm/m^2 <2.2 index  ------------------------------------------------------------ Prepared and Electronically Authenticated by  NMertie Moores2015-04-10T17:02:[id number]  Assessment and Plan: DAudley Hose89y.o. male with  1. Burkitt's lymphoma, status post CHOP/R x1, currently day 25. Counts continue to recover, though the hemoglobin is lacking. He has significant bone involvement, and had a negative brain MRI, although no LP has been done yet. Platelet count may soon reach 100,000    2. Cardiac:  Patient underwent a pre-chemotherapy echocardiogram on 05/17/13 that demonstrated a LVEF of 60-65%.    3. PICC Line: Patient is seen in our flush room for PICC line flushes on Monday, Wednesday and Friday, and has his dressing changed weekly.  He is now ready to convert to more permanent access   4. Anemia:  His hemoglobin is 7.7.  He remains asymptomatic. I am tentatively setting him up for a transfusion later this week but he will call it off if he does not develop any symptoms.   I gave them my beeper number so they can contact me on Friday one way or the other regarding definitive treatment plans [facility] They know to call for any other issues that may develop before then.   06/10/2013 4:40 PM   GChauncey Cruel MD

## 2013-06-12 ENCOUNTER — Ambulatory Visit (HOSPITAL_BASED_OUTPATIENT_CLINIC_OR_DEPARTMENT_OTHER): Payer: No Typology Code available for payment source

## 2013-06-12 VITALS — BP 132/72 | HR 111

## 2013-06-12 DIAGNOSIS — Z452 Encounter for adjustment and management of vascular access device: Secondary | ICD-10-CM

## 2013-06-12 DIAGNOSIS — C8379 Burkitt lymphoma, extranodal and solid organ sites: Secondary | ICD-10-CM

## 2013-06-12 MED ORDER — HEPARIN SOD (PORK) LOCK FLUSH 100 UNIT/ML IV SOLN
500.0000 [IU] | Freq: Once | INTRAVENOUS | Status: AC
  Administered 2013-06-12: 250 [IU] via INTRAVENOUS
  Filled 2013-06-12: qty 5

## 2013-06-12 MED ORDER — SODIUM CHLORIDE 0.9 % IJ SOLN
10.0000 mL | INTRAMUSCULAR | Status: DC | PRN
  Administered 2013-06-12: 10 mL via INTRAVENOUS
  Filled 2013-06-12: qty 10

## 2013-06-14 ENCOUNTER — Telehealth: Payer: Self-pay | Admitting: Oncology

## 2013-06-14 ENCOUNTER — Other Ambulatory Visit (HOSPITAL_COMMUNITY): Payer: Self-pay | Admitting: Oncology

## 2013-06-14 ENCOUNTER — Other Ambulatory Visit: Payer: Self-pay | Admitting: Oncology

## 2013-06-14 ENCOUNTER — Telehealth: Payer: Self-pay | Admitting: *Deleted

## 2013-06-14 NOTE — Progress Notes (Unsigned)
I was called by Stanton Kidney, UEs wife today, to tell me how it went at [facility] yesterday. They tell me they met with Dr. Gerald Dexter, and he plans to admit do we to do on May 12 for his first cycle of high-dose chemotherapy. The plan is for 6 cycles with the possibility of an autologous transplant at the end. He quoted you a 30% survival chance.  Almyra Free has a flushed today. His hemoglobin yesterday was 7.7. He is only mildly symptomatic with this. If he wishes it blood transfusion so I would provided but it is optional not mandatory.  I am canceling the flushes after today, since they will certainly remove his PICC line at [facility]) and place a more permanent access. I will make him a tentative return appointment in 10 weeks by which time he should have had 3 cycles of chemotherapy and should have had his first reassessment. Of course we will be available to do it on an as needed basis throughout.

## 2013-06-14 NOTE — Telephone Encounter (Signed)
, °

## 2013-06-14 NOTE — Telephone Encounter (Signed)
I have called patient regarding POF for blood transfusion. Patient has a call into his doctor in [place]. He will call me back this afternonn

## 2013-06-14 NOTE — Telephone Encounter (Signed)
Pt's wife called and cancel flush

## 2013-06-15 ENCOUNTER — Other Ambulatory Visit (HOSPITAL_COMMUNITY): Payer: Self-pay | Admitting: Oncology

## 2013-06-17 ENCOUNTER — Ambulatory Visit: Payer: No Typology Code available for payment source | Admitting: Internal Medicine

## 2013-08-22 ENCOUNTER — Telehealth: Payer: Self-pay

## 2013-08-22 NOTE — Telephone Encounter (Signed)
Fax received from [place] Dr. Gerald Dexter dtd 08/20/13 - progress notes.  Copy to GM.  Original to scan.

## 2013-08-30 ENCOUNTER — Other Ambulatory Visit: Payer: Self-pay | Admitting: *Deleted

## 2013-08-30 DIAGNOSIS — C8590 Non-Hodgkin lymphoma, unspecified, unspecified site: Secondary | ICD-10-CM

## 2013-08-30 DIAGNOSIS — E1149 Type 2 diabetes mellitus with other diabetic neurological complication: Secondary | ICD-10-CM

## 2013-08-30 DIAGNOSIS — C8370 Burkitt lymphoma, unspecified site: Secondary | ICD-10-CM

## 2013-09-02 ENCOUNTER — Other Ambulatory Visit: Payer: No Typology Code available for payment source

## 2013-09-02 ENCOUNTER — Ambulatory Visit: Payer: No Typology Code available for payment source | Admitting: Oncology

## 2013-10-25 ENCOUNTER — Telehealth: Payer: Self-pay

## 2013-10-25 NOTE — Telephone Encounter (Signed)
Progress notes rcvd from [facility] Dr. Gerald Dexter dtd 10/23/13.  Copy to [place].  Original to scan.

## 2013-11-22 ENCOUNTER — Other Ambulatory Visit: Payer: No Typology Code available for payment source

## 2013-11-22 ENCOUNTER — Ambulatory Visit: Payer: No Typology Code available for payment source | Admitting: Adult Health

## 2013-12-06 ENCOUNTER — Other Ambulatory Visit (HOSPITAL_COMMUNITY): Payer: Self-pay | Admitting: Oncology

## 2013-12-31 ENCOUNTER — Inpatient Hospital Stay (HOSPITAL_COMMUNITY)
Admission: EM | Admit: 2013-12-31 | Discharge: 2014-01-01 | DRG: 101 | Disposition: A | Discharge status: Other Acute Inpatient Hospital | Payer: No Typology Code available for payment source | Attending: Emergency Medicine | Admitting: Emergency Medicine

## 2013-12-31 ENCOUNTER — Other Ambulatory Visit: Payer: Self-pay | Admitting: Family Medicine

## 2013-12-31 ENCOUNTER — Encounter (HOSPITAL_COMMUNITY): Payer: Self-pay | Admitting: Emergency Medicine

## 2013-12-31 ENCOUNTER — Emergency Department (HOSPITAL_COMMUNITY): Payer: No Typology Code available for payment source

## 2013-12-31 DIAGNOSIS — G9340 Encephalopathy, unspecified: Secondary | ICD-10-CM

## 2013-12-31 DIAGNOSIS — E876 Hypokalemia: Secondary | ICD-10-CM | POA: Diagnosis present

## 2013-12-31 DIAGNOSIS — Z880 Allergy status to penicillin: Secondary | ICD-10-CM | POA: Diagnosis not present

## 2013-12-31 DIAGNOSIS — Z8572 Personal history of non-Hodgkin lymphomas: Secondary | ICD-10-CM | POA: Diagnosis not present

## 2013-12-31 DIAGNOSIS — G9389 Other specified disorders of brain: Secondary | ICD-10-CM | POA: Diagnosis present

## 2013-12-31 DIAGNOSIS — R229 Localized swelling, mass and lump, unspecified: Secondary | ICD-10-CM

## 2013-12-31 DIAGNOSIS — K219 Gastro-esophageal reflux disease without esophagitis: Secondary | ICD-10-CM | POA: Diagnosis present

## 2013-12-31 DIAGNOSIS — R569 Unspecified convulsions: Principal | ICD-10-CM

## 2013-12-31 DIAGNOSIS — I10 Essential (primary) hypertension: Secondary | ICD-10-CM | POA: Diagnosis present

## 2013-12-31 DIAGNOSIS — C8370 Burkitt lymphoma, unspecified site: Secondary | ICD-10-CM

## 2013-12-31 DIAGNOSIS — IMO0002 Reserved for concepts with insufficient information to code with codable children: Secondary | ICD-10-CM

## 2013-12-31 LAB — CBC WITH DIFFERENTIAL/PLATELET
BASOS ABS: 0 10*3/uL (ref 0.0–0.1)
BASOS PCT: 0 % (ref 0–1)
Eosinophils Absolute: 0.2 10*3/uL (ref 0.0–0.7)
Eosinophils Relative: 3 % (ref 0–5)
HCT: 40.7 % (ref 39.0–52.0)
HEMOGLOBIN: 13.5 g/dL (ref 13.0–17.0)
Lymphocytes Relative: 32 % (ref 12–46)
Lymphs Abs: 1.8 10*3/uL (ref 0.7–4.0)
MCH: 34.4 pg — ABNORMAL HIGH (ref 26.0–34.0)
MCHC: 33.2 g/dL (ref 30.0–36.0)
MCV: 103.6 fL — ABNORMAL HIGH (ref 78.0–100.0)
MONOS PCT: 14 % — AB (ref 3–12)
Monocytes Absolute: 0.8 10*3/uL (ref 0.1–1.0)
NEUTROS ABS: 2.8 10*3/uL (ref 1.7–7.7)
NEUTROS PCT: 51 % (ref 43–77)
PLATELETS: 144 10*3/uL — AB (ref 150–400)
RBC: 3.93 MIL/uL — ABNORMAL LOW (ref 4.22–5.81)
RDW: 14.4 % (ref 11.5–15.5)
WBC: 5.6 10*3/uL (ref 4.0–10.5)

## 2013-12-31 LAB — COMPREHENSIVE METABOLIC PANEL
ALBUMIN: 3 g/dL — AB (ref 3.5–5.2)
ALK PHOS: 118 U/L — AB (ref 39–117)
ALT: 14 U/L (ref 0–53)
ALT: 14 U/L (ref 0–53)
ANION GAP: 18 — AB (ref 5–15)
AST: 31 U/L (ref 0–37)
AST: 35 U/L (ref 0–37)
Albumin: 3.2 g/dL — ABNORMAL LOW (ref 3.5–5.2)
Alkaline Phosphatase: 110 U/L (ref 39–117)
Anion gap: 18 — ABNORMAL HIGH (ref 5–15)
BUN: 6 mg/dL (ref 6–23)
BUN: 5 mg/dL — AB (ref 6–23)
CALCIUM: 9.5 mg/dL (ref 8.4–10.5)
CO2: 30 mEq/L (ref 19–32)
CO2: 26 mEq/L (ref 19–32)
CREATININE: 0.62 mg/dL (ref 0.50–1.35)
Calcium: 9.3 mg/dL (ref 8.4–10.5)
Chloride: 92 mEq/L — ABNORMAL LOW (ref 96–112)
Chloride: 94 mEq/L — ABNORMAL LOW (ref 96–112)
Creatinine, Ser: 0.71 mg/dL (ref 0.50–1.35)
GFR calc Af Amer: >90 mL/min (ref >90)
GLUCOSE: 131 mg/dL — AB (ref 70–99)
Glucose, Bld: 136 mg/dL — ABNORMAL HIGH (ref 70–99)
Potassium: 2.9 mEq/L — CL (ref 3.7–5.3)
Potassium: 3.1 mEq/L — ABNORMAL LOW (ref 3.7–5.3)
Sodium: 140 mEq/L (ref 137–147)
Sodium: 138 mEq/L (ref 137–147)
Total Bilirubin: 0.6 mg/dL (ref 0.3–1.2)
Total Bilirubin: 0.6 mg/dL (ref 0.3–1.2)
Total Protein: 6 g/dL (ref 6.0–8.3)
Total Protein: 5.7 g/dL — ABNORMAL LOW (ref 6.0–8.3)

## 2013-12-31 LAB — I-STAT ARTERIAL BLOOD GAS, ED
ACID-BASE EXCESS: 6 mmol/L — AB (ref 0.0–2.0)
Bicarbonate: 31.6 mEq/L — ABNORMAL HIGH (ref 20.0–24.0)
O2 Saturation: 100 %
TCO2: 33 mmol/L (ref 0–100)
pCO2 arterial: 47.8 mmHg — ABNORMAL HIGH (ref 35.0–45.0)
pH, Arterial: 7.427 (ref 7.350–7.450)
pO2, Arterial: 181 mmHg — ABNORMAL HIGH (ref 80.0–100.0)

## 2013-12-31 LAB — URINALYSIS, ROUTINE W REFLEX MICROSCOPIC
Bilirubin Urine: NEGATIVE
Glucose, UA: NEGATIVE mg/dL
Hgb urine dipstick: NEGATIVE
Ketones, ur: NEGATIVE mg/dL
LEUKOCYTES UA: NEGATIVE
NITRITE: NEGATIVE
Protein, ur: NEGATIVE mg/dL
Specific Gravity, Urine: 1.012 (ref 1.005–1.030)
UROBILINOGEN UA: 0.2 mg/dL (ref 0.0–1.0)
pH: 7 (ref 5.0–8.0)

## 2013-12-31 LAB — I-STAT VENOUS BLOOD GAS, ED
Acid-Base Excess: 5 mmol/L — ABNORMAL HIGH (ref 0.0–2.0)
Bicarbonate: 30.7 mEq/L — ABNORMAL HIGH (ref 20.0–24.0)
O2 SAT: 62 %
PO2 VEN: 32 mmHg (ref 30.0–45.0)
TCO2: 32 mmol/L (ref 0–100)
pCO2, Ven: 46.9 mmHg (ref 45.0–50.0)
pH, Ven: 7.423 — ABNORMAL HIGH (ref 7.250–7.300)

## 2013-12-31 LAB — PROTIME-INR
INR: 1.11 (ref 0.00–1.49)
PROTHROMBIN TIME: 14.4 s (ref 11.6–15.2)

## 2013-12-31 LAB — I-STAT CG4 LACTIC ACID, ED: LACTIC ACID, VENOUS: 6.05 mmol/L — AB (ref 0.5–2.2)

## 2013-12-31 LAB — AMMONIA: AMMONIA: 30 umol/L (ref 11–60)

## 2013-12-31 LAB — CBG MONITORING, ED: GLUCOSE-CAPILLARY: 138 mg/dL — AB (ref 70–99)

## 2013-12-31 MED ORDER — LORAZEPAM 2 MG/ML IJ SOLN
INTRAMUSCULAR | Status: AC
  Filled 2013-12-31: qty 1

## 2013-12-31 MED ORDER — SODIUM CHLORIDE 0.9 % IV BOLUS (SEPSIS)
1000.0000 mL | Freq: Once | INTRAVENOUS | Status: AC
  Administered 2013-12-31: 1000 mL via INTRAVENOUS

## 2013-12-31 MED ORDER — POTASSIUM CHLORIDE 10 MEQ/100ML IV SOLN: 10.0000 meq | Freq: Once | INTRAVENOUS | Status: DC

## 2013-12-31 MED ORDER — POTASSIUM CHLORIDE 10 MEQ/100ML IV SOLN
10.0000 meq | INTRAVENOUS | Status: DC
Start: 2013-12-31 — End: 2014-01-01
  Administered 2013-12-31: 10 meq via INTRAVENOUS
  Filled 2013-12-31 (×2): qty 100

## 2013-12-31 MED ORDER — LEVETIRACETAM IN NACL 1000 MG/100ML IV SOLN
1000.0000 mg | INTRAVENOUS | Status: AC
  Administered 2013-12-31: 1000 mg via INTRAVENOUS
  Filled 2013-12-31: qty 100

## 2013-12-31 MED ORDER — LORAZEPAM 2 MG/ML IJ SOLN
1.0000 mg | Freq: Once | INTRAMUSCULAR | Status: AC
  Administered 2013-12-31: 1 mg via INTRAVENOUS
  Filled 2013-12-31: qty 1

## 2013-12-31 MED ORDER — LORAZEPAM 2 MG/ML IJ SOLN
2.0000 mg | Freq: Once | INTRAMUSCULAR | Status: AC
  Administered 2013-12-31: 2 mg via INTRAVENOUS

## 2013-12-31 NOTE — ED Notes (Signed)
Stanton Kidney (Wife) [phone]  Please call with room assignment when transferred to [facility].

## 2013-12-31 NOTE — ED Notes (Addendum)
Patient is agitated.  Pt is yelling and pulling at his foley.  Tension is high in the room with the family.

## 2013-12-31 NOTE — ED Notes (Signed)
Pt to ED via [facility] for evaluation of seizures.  Pt was found by wife sitting in truck actively seizing, pt noted to be having tonic clonic seizure by EMS- pt assisted by EMS to ground where IV was started and 2.5mg  Versed administered- pt tonic activity subsided but pt continued to have focal seizures in transport to ED.  NPA placed, pt actively vomiting with seizure activity.  EDP at bedside upon EMS arrival.

## 2013-12-31 NOTE — ED Notes (Signed)
Staffing notified for need of sitter.

## 2013-12-31 NOTE — ED Notes (Signed)
NOTIFIED DR. LOCKWOOD IN PERSON FOR PATIENTS PANIC LAB RESULTS OF CG4+ LACTIC ACID = 6.6mmol/L, @18 :44 PM ,12/31/2013.

## 2013-12-31 NOTE — ED Notes (Signed)
Pt agitated and pulling off monitor leads and gown, pt trying to get out of bed. Safety sitter by ED staff placed at bedside.

## 2013-12-31 NOTE — Progress Notes (Addendum)
MEDICATION RELATED CONSULT NOTE - INITIAL   Pharmacy Consult for potassium supplementation Indication: hypokalemia  Allergies  Allergen Reactions  . Penicillins Hives    Patient Measurements: Height: 5\' 7"  (170.2 cm) Weight: 175 lb (79.379 kg) IBW/kg (Calculated) : 66.1 Adjusted Body Weight:   Vital Signs: Temp: 100.6 F (38.1 C) (11/24 2030) Temp Source: Oral (11/24 1942) BP: 116/93 mmHg (11/24 2030) Pulse Rate: 107 (11/24 2030) Intake/Output from previous day:   Intake/Output from this shift:    Labs:  Recent Labs  12/31/13 1812 12/31/13 1955  WBC 5.6  --   HGB 13.5  --   HCT 40.7  --   PLT 144*  --   CREATININE 0.71 0.62  ALBUMIN 3.2* 3.0*  PROT 6.0 5.7*  AST 31 35  ALT 14 14  ALKPHOS 118* 110  BILITOT 0.6 0.6   Estimated Creatinine Clearance: 102.9 mL/min (by C-G formula based on Cr of 0.62).   Microbiology: No results found for this or any previous visit (from the past 720 hour(s)).  Medical History: Past Medical History  Diagnosis Date  . GERD (gastroesophageal reflux disease)   . Hypertension   . Peptic ulcer   . Gout   . Hiatal hernia   . Diabetes mellitus without complication   . Hyperlipidemia   . Non Hodgkin's lymphoma    Assessment: 8 yom presented to the ED with seizures. Also noted to be hypokalemic at 2.9 upon admission. MD ordered 1 run of 74meq KCl but never administered. A repeat level was 3.1. Pharmacy to take over supplementation. Pt has excellent renal function. Of note, he is on furosemide at home which may have contributed to hypokalemia.   Goal of Therapy:  Potassium 3.7-5.3  Plan:  1. Potassium chloride 57meq Q1H x 4 runs 2. Check AM potassium and magnesium  Salome Arnt, PharmD, BCPS Pager # [phone] 12/31/2013 10:28 PM

## 2013-12-31 NOTE — ED Provider Notes (Signed)
CSN: [id number]     Arrival date & time 12/31/13  1803 History   First MD Initiated Contact with Patient 12/31/13 1806     Chief Complaint  Patient presents with  . Seizures     (Consider location/radiation/quality/duration/timing/severity/associated sxs/prior Treatment) HPI Patient is a 58 year old male with history of large non-Hodgkin's lymphoma, Burkitt's lymphoma, who presents following seizures. The patient recently terminated chemotherapy at [facility] due to intolerance. His wife says that he has also been noncompliant with many of his medications, including potassium supplementation, but has been taking his Lasix. He apparently was in his usual state of health this morning, drove himself to physical therapy and then home, before being found seizing in his truck by his wife. She says that she found him having full body shaking, was unresponsive, and EMS was called. EMS found him still seizing, and gave him 2-1/2 mg of IV Versed without resolution of his seizure activity. He was placed on high flow oxygen, and transported here emergently.  On his arrival the patient was having focal seizure activity of the right arm. Unresponsive. Past Medical History  Diagnosis Date  . GERD (gastroesophageal reflux disease)   . Hypertension   . Peptic ulcer   . Gout   . Hiatal hernia   . Diabetes mellitus without complication   . Hyperlipidemia   . Non Hodgkin's lymphoma    Past Surgical History  Procedure Laterality Date  . Pilonidal cyst / sinus excision    . 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 Tonsillectomy    . Colonoscopy    . Esophagogastroduodenoscopy N/A 05/11/2013    Procedure: ESOPHAGOGASTRODUODENOSCOPY (EGD);  Surgeon: Milus Banister, MD;  Location: [facility];  Service: Endoscopy;  Laterality: N/A;   Family History  Problem Relation Age of Onset  . Gout Father   . COPD Father   . Diabetes type II Father   . Colon cancer Neg Hx   . Stomach cancer Neg Hx   . Esophageal  cancer Neg Hx   . Pancreatic cancer Neg Hx   . Prostate cancer Neg Hx   . Rectal cancer Neg Hx   . Diabetes Mother    History  Substance Use Topics  . Smoking status: Never Smoker   . Smokeless tobacco: Current User    Types: Snuff  . Alcohol Use: No     Comment: last drink 3 weeks ago.  Per Pt not drinking alcohol now.  When he did drink about 24 beers per week.    Review of Systems  Unable to perform ROS: Patient unresponsive      Allergies  Penicillins  Home Medications   Prior to Admission medications   Medication Sig Start Date End Date Taking? Authorizing Provider  furosemide (LASIX) 40 MG tablet Take 40 mg by mouth.   Yes Historical Provider, MD  pantoprazole (PROTONIX) 40 MG tablet TAKE 1 TABLET BY MOUTH DAILY. 12/31/13  Yes Amy Cletis [place], MD  spironolactone (ALDACTONE) 50 MG tablet Take 50 mg by mouth daily.   Yes Historical Provider, MD  allopurinol (ZYLOPRIM) 300 MG tablet Take 1 tablet (300 mg total) by mouth daily. Patient not taking: Reported on 12/31/2013 05/22/13   Chauncey Cruel, MD  BISACODYL 5 MG EC tablet TAKE 1 TABLET BY MOUTH DAILY AS NEEDED FOR MODERATE CONSTIPATION. Patient not taking: Reported on 12/31/2013 06/14/13   Chauncey Cruel, MD  BISACODYL 5 MG EC tablet TAKE 1 TABLET BY MOUTH DAILY AS NEEDED  FOR MODERATE CONSTIPATION. Patient not taking: Reported on 12/31/2013    Chauncey Cruel, MD  colchicine 0.6 MG tablet Take 1 tablet (0.6 mg total) by mouth daily. Patient not taking: Reported on 12/31/2013 06/10/13   Chauncey Cruel, MD  esomeprazole (NEXIUM) 40 MG packet Take 40 mg by mouth daily before breakfast. Patient not taking: Reported on 12/31/2013 05/01/13   Jinny Sanders, MD  HYDROcodone-acetaminophen [facility]) 10-325 MG per tablet  04/23/13   Historical Provider, MD  metoCLOPramide (REGLAN) 5 MG tablet Take 1 tablet (5 mg total) by mouth 3 (three) times daily before meals. Patient not taking: Reported on 12/31/2013 05/22/13   Chauncey Cruel, MD  polyethylene glycol powder (GLYCOLAX/MIRALAX) powder TAKE 17 GRAMS BY MOUTH AS DIRECTED DAILY. Patient not taking: Reported on 12/31/2013 12/06/13   Chauncey Cruel, MD  venlafaxine XR (EFFEXOR-XR) 37.5 MG 24 hr capsule Take 1 capsule (37.5 mg total) by mouth daily with breakfast. Patient not taking: Reported on 12/31/2013 05/31/13   Chauncey Cruel, MD   BP 116/93 mmHg  Pulse 107  Temp(Src) 100.6 F (38.1 C)  Resp 21  Ht 5\' 7"  (1.702 m)  Wt 175 lb (79.379 kg)  BMI 27.40 kg/m2  SpO2 96% Physical Exam  Constitutional: He appears well-developed. He appears distressed.  HENT:  Head: Normocephalic and atraumatic.  Eyes: Pupils are equal, round, and reactive to light.  Rightward eye deviation  Neck: Neck supple. No JVD present.  Cardiovascular: Intact distal pulses.   Tachycardia  Pulmonary/Chest: Effort normal and breath sounds normal. No respiratory distress. He has no wheezes.  Abdominal: Soft. He exhibits distension and ascites.  Neurological: He is unresponsive. He displays seizure activity (focal seizure activity in the right upper extremity).  Skin: Skin is warm and dry. There is pallor.  Nursing note and vitals reviewed.   ED Course  Procedures (including critical care time) Labs Review Labs Reviewed  COMPREHENSIVE METABOLIC PANEL - Abnormal; Notable for the following:    Potassium 2.9 (*)    Chloride 92 (*)    Glucose, Bld 131 (*)    Albumin 3.2 (*)    Alkaline Phosphatase 118 (*)    Anion gap 18 (*)    All other components within normal limits  CBC WITH DIFFERENTIAL - Abnormal; Notable for the following:    RBC 3.93 (*)    MCV 103.6 (*)    MCH 34.4 (*)    Platelets 144 (*)    Monocytes Relative 14 (*)    All other components within normal limits  COMPREHENSIVE METABOLIC PANEL - Abnormal; Notable for the following:    Potassium 3.1 (*)    Chloride 94 (*)    Glucose, Bld 136 (*)    BUN 5 (*)    Total Protein 5.7 (*)    Albumin 3.0 (*)     Anion gap 18 (*)    All other components within normal limits  I-STAT CG4 LACTIC ACID, ED - Abnormal; Notable for the following:    Lactic Acid, Venous 6.05 (*)    All other components within normal limits  I-STAT VENOUS BLOOD GAS, ED - Abnormal; Notable for the following:    pH, Ven 7.423 (*)    Bicarbonate 30.7 (*)    Acid-Base Excess 5.0 (*)    All other components within normal limits  CBG MONITORING, ED - Abnormal; Notable for the following:    Glucose-Capillary 138 (*)    All other components within normal limits  I-STAT  ARTERIAL BLOOD GAS, ED - Abnormal; Notable for the following:    pCO2 arterial 47.8 (*)    pO2, Arterial 181.0 (*)    Bicarbonate 31.6 (*)    Acid-Base Excess 6.0 (*)    All other components within normal limits  PROTIME-INR  AMMONIA  URINALYSIS, ROUTINE W REFLEX MICROSCOPIC  MAGNESIUM  PHOSPHORUS    Imaging Review Ct Head Wo Contrast  12/31/2013   CLINICAL DATA:  Seizure activity, altered mental status  EXAM: CT HEAD WITHOUT CONTRAST  TECHNIQUE: Contiguous axial images were obtained from the base of the skull through the vertex without intravenous contrast.  COMPARISON:  05/13/2013 05/16/2013  FINDINGS: The bony calvarium is intact. The paranasal sinus show show changes in the maxillary antra bilaterally which are unchanged from a prior PET-CT consistent with chronic inflammatory change.  Mild atrophic changes are noted. In the left frontal lobe along the falx there is a 2.9 x 1.8 cm peripherally dense mass with surrounding white matter edema consistent with an underlying neoplasm. This was not present on the prior MRI in April of 2015 and was not well evaluated on the PET-CT from April of 2015. No other focal lesions are identified. Patient has a given clinical history of lymphoma in this may represent a lymphomatous deposit.  IMPRESSION: New left frontal lesion as described above with surrounding white matter edema. This is suspicious for underlying neoplasm and  may represent a focal the lymphoma deposit. MRI of the brain with contrast is recommended for further evaluation when the patient's condition improves.  Atrophic changes stable from the previous exam.  These results were called by telephone at the time of interpretation on 12/31/2013 at 7:11 pm to Dr. Carmin Muskrat , who verbally acknowledged these results.   Electronically Signed   By: Inez Catalina M.D.   On: 12/31/2013 19:17   Dg Chest Port 1 View  12/31/2013   CLINICAL DATA:  Hypertension, diabetes and cancer.  Seizures  EXAM: PORTABLE CHEST - 1 VIEW  COMPARISON:  05/12/2013  FINDINGS: There is a right chest wall port a catheter with tip in the low right atrium. The heart size appears normal. The lung volumes are low and there is no pleural effusion or edema. No airspace consolidation identified.  IMPRESSION: 1. Low lung volumes. 2. The right chest wall port a catheter tip appears to be in the low right atrium.   Electronically Signed   By: [name] M.D.   On: 12/31/2013 18:47     EKG Interpretation   Date/Time:  Tuesday December 31 2013 18:08:31 EST Ventricular Rate:  115 PR Interval:  135 QRS Duration: 91 QT Interval:  319 QTC Calculation: 441 R Axis:   -74 Text Interpretation:  Sinus tachycardia Ventricular premature complex LAD,  consider left anterior fascicular block Low voltage, precordial leads  Abnormal R-wave progression, late transition Sinus tachycardia  Non-specific intra-ventricular conduction delay Left axis deviation  Abnormal ekg Confirmed by Carmin Muskrat  MD [phone]) on 12/31/2013  7:08:38 PM      MDM   Final diagnoses:  Mass  Burkitts lymphoma  Cerebral mass  Seizure  Encephalopathy  Hypokalemia   58 year old male presenting with new onset seizures. Upon his arrival, patient was still actively seizing, but after IV Ativan, and Keppra, seizure activity ceased. Initially we were concerned that he may not protect his airway, but after observation,  patient's mental status improved. He was able to follow simple commands, open his eyes spontaneously, and was protecting his airway  adequately.  CT scan of the head shows a large mass in the left frontal lobe, concerning for lymphoma. No signs of acute hemorrhage. Labs notable for hypo-kalemia, lactic acidosis, ammonia of 30, elevated alkaline phosphatase.  Discussed this case with on-call neurosurgery, who requested MRI of the brain. His dentition seems to have stabilized, we'll plan to admit to the hospitalist for workup in the morning, and possible transfer to [facility] once he is stable.  During the admission process, the patient's family insisted to the hospitalist that the patient be transferred to [facility]. Reassessed, and deemed to be stable for transport. [facility] transfer line called, accepted for transfer to [facility]. Stable at time of transfer.   Leata Mouse, MD 01/01/14 [place], MD 01/06/14 [phone]

## 2013-12-31 NOTE — ED Notes (Signed)
Dr.Lockwood at bedside  

## 2014-01-01 LAB — MAGNESIUM: MAGNESIUM: 1.6 mg/dL (ref 1.5–2.5)

## 2014-01-01 LAB — PHOSPHORUS: Phosphorus: 2.6 mg/dL (ref 2.3–4.6)

## 2014-01-01 MED ORDER — LORAZEPAM 2 MG/ML IJ SOLN
1.0000 mg | Freq: Once | INTRAMUSCULAR | Status: AC
  Administered 2014-01-01: 1 mg via INTRAVENOUS
  Filled 2014-01-01: qty 1

## 2014-01-01 MED ORDER — DEXAMETHASONE SODIUM PHOSPHATE 10 MG/ML IJ SOLN
10.0000 mg | Freq: Once | INTRAMUSCULAR | Status: AC
  Administered 2014-01-01: 10 mg via INTRAVENOUS
  Filled 2014-01-01: qty 1

## 2014-01-01 NOTE — ED Notes (Signed)
Call [facility] to transport to [facility]

## 2014-01-01 NOTE — ED Notes (Signed)
Call [facility] to transport to [facility]

## 2014-01-01 NOTE — ED Notes (Signed)
Called the patient's wife and advised her that [facility] just picked up the patient.  I advised her that they were en route to [facility].

## 2014-02-07 DEATH — deceased

## 2014-03-28 NOTE — Telephone Encounter (Signed)
none

## 2015-07-25 ENCOUNTER — Other Ambulatory Visit: Payer: Self-pay | Admitting: Nurse Practitioner

## 2015-11-25 IMAGING — CT CT BIOPSY
2 of 3 series · 12 of 18 positions shown, 17 images · non-contrast
Comparison: none

CLINICAL DATA: Thrombocytopenia

EXAM:
CT GUIDED DEEP ILIAC BONE ASPIRATION AND CORE BIOPSY
TECHNIQUE: The procedure, risks (including but not limited to bleeding,
infection, organ damage ), benefits, and alternatives were explained
to the patient. Questions regarding the procedure were encouraged
and answered. The patient understands and consents to the procedure.
Patient was placed supine on the CT gantry and limited axial scans
through the pelvis were obtained. Appropriate skin entry site was
identified. Skin site was marked, prepped with Betadine, draped in
usual sterile fashion, and infiltrated locally with 1% lidocaine.

[Series 4: (hospital) 6.0 b30f · axial · 1.09mm/px · z∈[+818,+818]mm · 6 of 8 slices shown, 11 images (1 of 2)]
[im 2/8  soft-tissue]
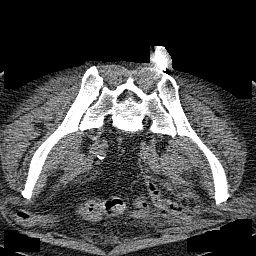
[im 2/8  lung]
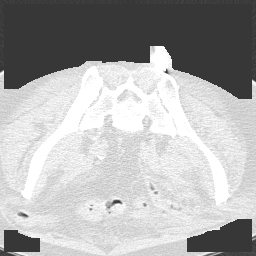
[im 2/8  bone]
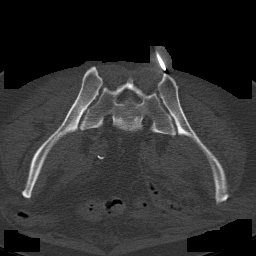
[im 3/8  soft-tissue]
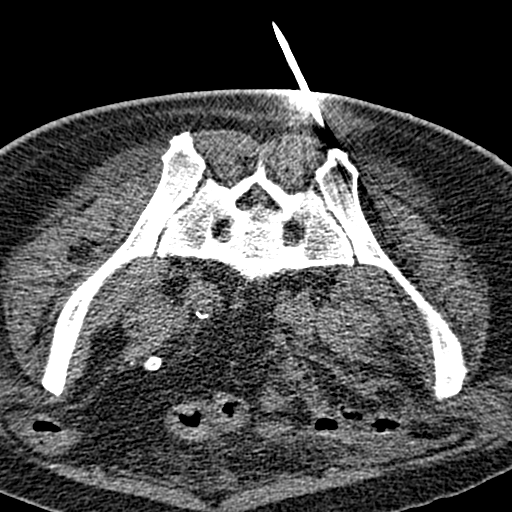
[im 3/8  lung]
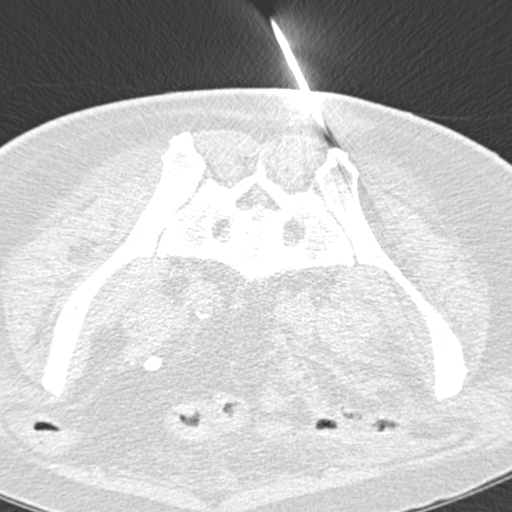
[im 4/8  soft-tissue]
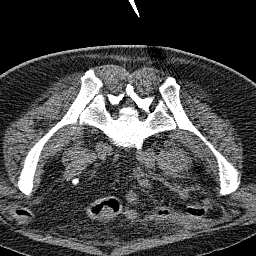
[im 4/8  lung]
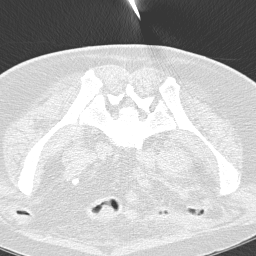
[im 5/8  soft-tissue]
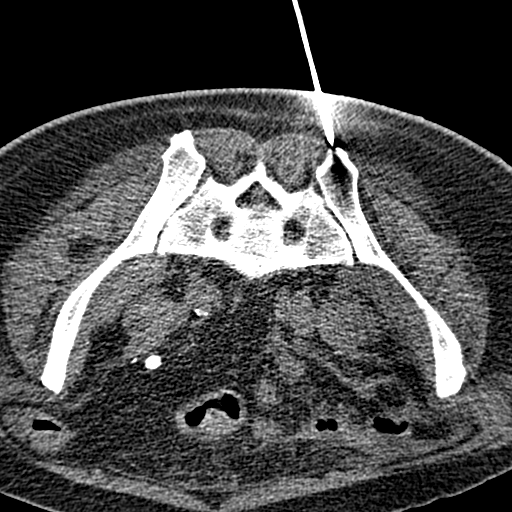
[im 5/8  lung]
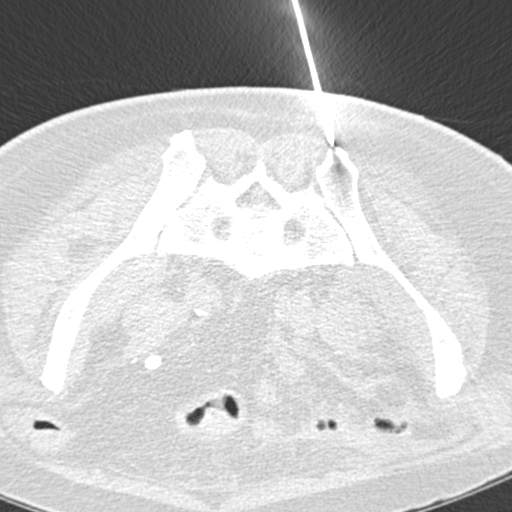
[im 6/8  soft-tissue]
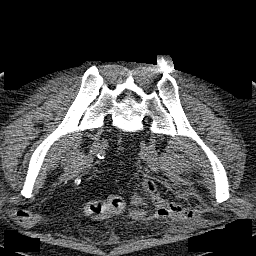
[im 7/8  soft-tissue]
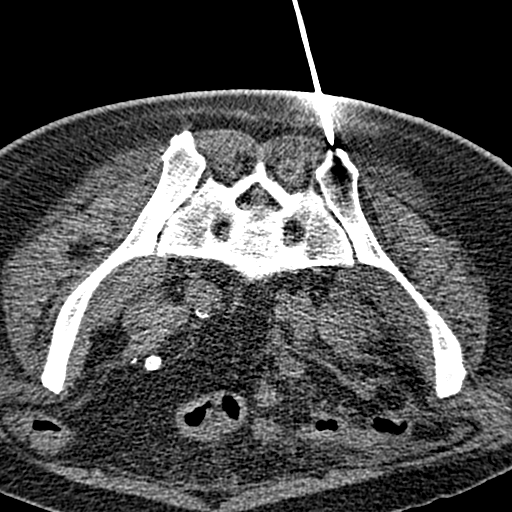

[Series 5: (hospital) 6.0 b30f · axial · 1.09mm/px · z∈[+818,+818]mm · 6 of 8 slices shown (2 of 2)]
[im 2/8  soft-tissue]
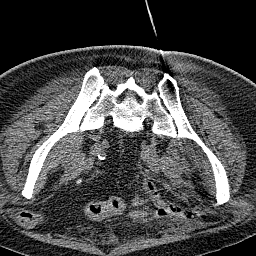
[im 3/8  soft-tissue]
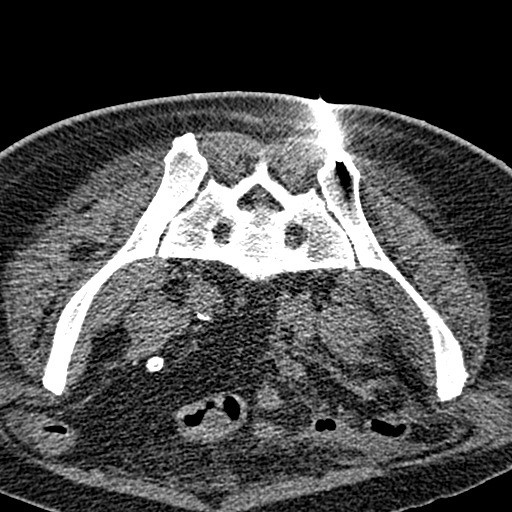
[im 4/8  soft-tissue]
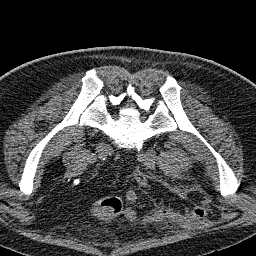
[im 5/8  soft-tissue]
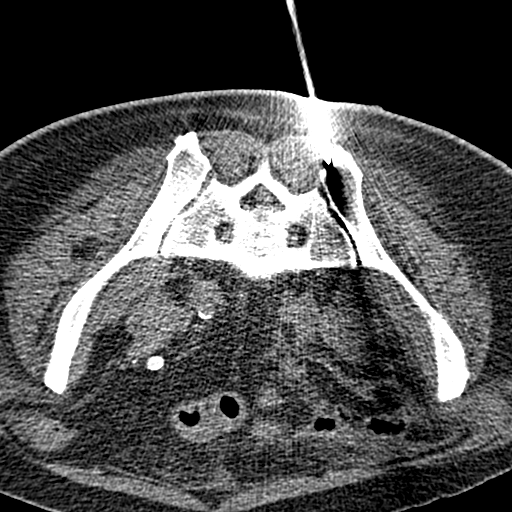
[im 6/8  soft-tissue]
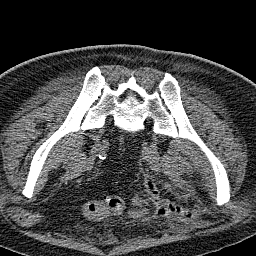
[im 7/8  soft-tissue]
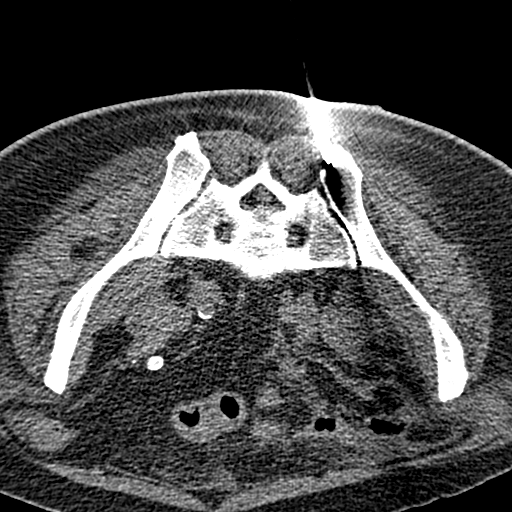

[12 of 18 positions shown; findings below may reference images not displayed]

Intravenous Fentanyl and Versed were administered as conscious
sedation during continuous cardiorespiratory monitoring by the
radiology RN, with a total moderate sedation time of less than 30
minutes.

Under CT fluoroscopic guidance an 11-gauge Cook trocar bone needle
was advanced into the right iliac bone just lateral to the
sacroiliac joint. Once needle tip position was confirmed, coaxial
aspiration samples were attempted but not obtained. The needles
reposition the second time but again no aspirate could be obtained.
Two separate long core specimens were obtained using the needle
itself, which was then removed. Post procedure scans show no
hematoma or fracture. Patient tolerated procedure well, with no
immediate complication.
IMPRESSION: 1. Technically successful CT guided right iliac bone core and
aspiration biopsy.
